# Patient Record
Sex: Female | Born: 1960 | Race: Black or African American | Hispanic: No | Smoking: Former smoker
Health system: Southern US, Community
[De-identification: ages and names within clinical notes are randomized; demographics above are authoritative.]

## PROBLEM LIST (undated history)

## (undated) DIAGNOSIS — K5900 Constipation, unspecified: Secondary | ICD-10-CM

## (undated) DIAGNOSIS — N201 Calculus of ureter: Secondary | ICD-10-CM

## (undated) DIAGNOSIS — N200 Calculus of kidney: Secondary | ICD-10-CM

## (undated) DIAGNOSIS — Z87442 Personal history of urinary calculi: Secondary | ICD-10-CM

## (undated) DIAGNOSIS — Z973 Presence of spectacles and contact lenses: Secondary | ICD-10-CM

## (undated) DIAGNOSIS — D509 Iron deficiency anemia, unspecified: Secondary | ICD-10-CM

## (undated) HISTORY — PX: APPENDECTOMY: SHX54

---

## 1998-08-08 ENCOUNTER — Emergency Department (HOSPITAL_COMMUNITY): Admission: EM | Admit: 1998-08-08 | Discharge: 1998-08-08 | Payer: Self-pay | Admitting: Internal Medicine

## 1998-08-08 ENCOUNTER — Encounter: Payer: Self-pay | Admitting: Internal Medicine

## 1998-08-23 ENCOUNTER — Emergency Department (HOSPITAL_COMMUNITY): Admission: EM | Admit: 1998-08-23 | Discharge: 1998-08-23 | Payer: Self-pay | Admitting: Emergency Medicine

## 2005-12-10 ENCOUNTER — Emergency Department (HOSPITAL_COMMUNITY): Admission: EM | Admit: 2005-12-10 | Discharge: 2005-12-10 | Payer: Self-pay | Admitting: *Deleted

## 2005-12-11 ENCOUNTER — Inpatient Hospital Stay (HOSPITAL_COMMUNITY): Admission: AD | Admit: 2005-12-11 | Discharge: 2005-12-13 | Payer: Self-pay | Admitting: Urology

## 2005-12-12 ENCOUNTER — Ambulatory Visit: Payer: Self-pay | Admitting: Dentistry

## 2005-12-25 ENCOUNTER — Ambulatory Visit (HOSPITAL_COMMUNITY): Admission: RE | Admit: 2005-12-25 | Discharge: 2005-12-25 | Payer: Self-pay | Admitting: Urology

## 2006-02-10 ENCOUNTER — Emergency Department (HOSPITAL_COMMUNITY): Admission: EM | Admit: 2006-02-10 | Discharge: 2006-02-10 | Payer: Self-pay | Admitting: Emergency Medicine

## 2006-04-28 HISTORY — PX: EXTRACORPOREAL SHOCK WAVE LITHOTRIPSY: SHX1557

## 2006-07-13 ENCOUNTER — Emergency Department (HOSPITAL_COMMUNITY): Admission: EM | Admit: 2006-07-13 | Discharge: 2006-07-13 | Payer: Self-pay | Admitting: Emergency Medicine

## 2015-01-01 ENCOUNTER — Encounter (HOSPITAL_COMMUNITY): Payer: Self-pay | Admitting: Emergency Medicine

## 2015-01-01 ENCOUNTER — Emergency Department (HOSPITAL_COMMUNITY)
Admission: EM | Admit: 2015-01-01 | Discharge: 2015-01-02 | Disposition: A | Discharge status: Home / Self Care | Payer: BLUE CROSS/BLUE SHIELD | Attending: Emergency Medicine | Admitting: Emergency Medicine

## 2015-01-01 ENCOUNTER — Emergency Department (HOSPITAL_COMMUNITY): Payer: BLUE CROSS/BLUE SHIELD

## 2015-01-01 DIAGNOSIS — N200 Calculus of kidney: Secondary | ICD-10-CM | POA: Diagnosis not present

## 2015-01-01 DIAGNOSIS — Z3202 Encounter for pregnancy test, result negative: Secondary | ICD-10-CM | POA: Insufficient documentation

## 2015-01-01 DIAGNOSIS — Z87891 Personal history of nicotine dependence: Secondary | ICD-10-CM | POA: Insufficient documentation

## 2015-01-01 DIAGNOSIS — N390 Urinary tract infection, site not specified: Secondary | ICD-10-CM | POA: Diagnosis not present

## 2015-01-01 DIAGNOSIS — R109 Unspecified abdominal pain: Secondary | ICD-10-CM

## 2015-01-01 HISTORY — DX: Calculus of kidney: N20.0

## 2015-01-01 LAB — CBC WITH DIFFERENTIAL/PLATELET
Basophils Absolute: 0 10*3/uL (ref 0.0–0.1)
Basophils Relative: 0 % (ref 0–1)
EOS ABS: 0 10*3/uL (ref 0.0–0.7)
EOS PCT: 0 % (ref 0–5)
HCT: 39.4 % (ref 36.0–46.0)
Hemoglobin: 12.2 g/dL (ref 12.0–15.0)
LYMPHS ABS: 1.6 10*3/uL (ref 0.7–4.0)
LYMPHS PCT: 16 % (ref 12–46)
MCH: 24.4 pg — AB (ref 26.0–34.0)
MCHC: 31 g/dL (ref 30.0–36.0)
MCV: 78.6 fL (ref 78.0–100.0)
MONO ABS: 0.9 10*3/uL (ref 0.1–1.0)
Monocytes Relative: 9 % (ref 3–12)
Neutro Abs: 7 10*3/uL (ref 1.7–7.7)
Neutrophils Relative %: 75 % (ref 43–77)
PLATELETS: 256 10*3/uL (ref 150–400)
RBC: 5.01 MIL/uL (ref 3.87–5.11)
RDW: 13.9 % (ref 11.5–15.5)
WBC: 9.4 10*3/uL (ref 4.0–10.5)

## 2015-01-01 LAB — I-STAT CHEM 8, ED
BUN: 26 mg/dL — ABNORMAL HIGH (ref 6–20)
CALCIUM ION: 1.17 mmol/L (ref 1.12–1.23)
Chloride: 107 mmol/L (ref 101–111)
Creatinine, Ser: 1.2 mg/dL — ABNORMAL HIGH (ref 0.44–1.00)
GLUCOSE: 117 mg/dL — AB (ref 65–99)
HCT: 44 % (ref 36.0–46.0)
HEMOGLOBIN: 15 g/dL (ref 12.0–15.0)
Potassium: 4.1 mmol/L (ref 3.5–5.1)
Sodium: 141 mmol/L (ref 135–145)
TCO2: 23 mmol/L (ref 0–100)

## 2015-01-01 MED ORDER — ONDANSETRON HCL 4 MG/2ML IJ SOLN
4.0000 mg | Freq: Once | INTRAMUSCULAR | Status: AC
  Administered 2015-01-01: 4 mg via INTRAVENOUS
  Filled 2015-01-01: qty 2

## 2015-01-01 MED ORDER — SODIUM CHLORIDE 0.9 % IV BOLUS (SEPSIS)
1000.0000 mL | Freq: Once | INTRAVENOUS | Status: AC
  Administered 2015-01-01: 1000 mL via INTRAVENOUS

## 2015-01-01 NOTE — ED Notes (Signed)
Pt reports left lower flank pain with emesis starting 3 hours ago. Hx nephrolithiasis. Reports dysuria. No other c/c. No active vomiting in triage.

## 2015-01-01 NOTE — ED Provider Notes (Signed)
CSN: [id number]   Arrival date & time 01/01/15 2041  History  This chart was scribed for Denise Crumble, MD by Bethel Born, ED Scribe. This patient was seen in room [facility] and the patient's care was started at 11:10 PM.  Chief Complaint  Patient presents with  . Flank Pain  . Emesis  . Hx Kidney Stones     HPI The history is provided by the patient. No language interpreter was used.   Denise Hudson is a 54 y.o. female with PMHx of nephrolithiasis who presents to the Emergency Department complaining of constant left flank pain with sudden onset today at work.  She has had similar pain in the past with kidney stones (some of which required surgical intervention). The pain is described as throbbing/aching and rated 10/10 in severity. Pt took nothing for pain PTA. Associated symptoms include abdominal pain, nausea, vomiting, and dysuria.   She states she has had decreased urination.  Past Medical History  Diagnosis Date  . Nephrolithiasis     History reviewed. No pertinent past surgical history.  History reviewed. No pertinent family history.  Social History  Substance Use Topics  . Smoking status: Former Games developer  . Smokeless tobacco: None  . Alcohol Use: No     Review of Systems  10 Systems reviewed and all are negative for acute change except as noted in the HPI.  Home Medications   Prior to Admission medications   Medication Sig Start Date End Date Taking? Authorizing Provider  Aspirin-Salicylamide-Caffeine (BC HEADACHE POWDER PO) Take 1 each by mouth once.   Yes Historical Provider, MD    Allergies  Review of patient's allergies indicates no known allergies.  Triage Vitals: BP 177/95 mmHg  Pulse 89  Temp(Src) 98.3 F (36.8 C) (Oral)  Resp 16  SpO2 97%  LMP 12/20/2014 (Approximate)  Physical Exam  Constitutional: She is oriented to person, place, and time. She appears well-developed and well-nourished. No distress.  HENT:  Head: Normocephalic and atraumatic.   Nose: Nose normal.  Mouth/Throat: Oropharynx is clear and moist. No oropharyngeal exudate.  Eyes: Conjunctivae and EOM are normal. Pupils are equal, round, and reactive to light. No scleral icterus.  Neck: Normal range of motion. Neck supple. No JVD present. No tracheal deviation present. No thyromegaly present.  Cardiovascular: Normal rate, regular rhythm and normal heart sounds.  Exam reveals no gallop and no friction rub.   No murmur heard. Pulmonary/Chest: Effort normal and breath sounds normal. No respiratory distress. She has no wheezes. She exhibits no tenderness.  Abdominal: Soft. Bowel sounds are normal. She exhibits no distension and no mass. There is no rebound and no guarding.  Left CVA tenderness  Musculoskeletal: Normal range of motion. She exhibits no edema or tenderness.  Lymphadenopathy:    She has no cervical adenopathy.  Neurological: She is alert and oriented to person, place, and time. No cranial nerve deficit. She exhibits normal muscle tone.  Skin: Skin is warm and dry. No rash noted. No erythema. No pallor.  Nursing note and vitals reviewed.   ED Course  Procedures   DIAGNOSTIC STUDIES: Oxygen Saturation is 97% on RA, normal by my interpretation.    COORDINATION OF CARE: 11:12 PM Discussed treatment plan which includes lab work, CT renal stone study, Zofran, and IVF with pt at bedside and pt agreed to plan. Pt declines pain medication at initial assessment.  Labs Reviewed  CBC WITH DIFFERENTIAL/PLATELET - Abnormal; Notable for the following:    MCH 24.4 (*)  All other components within normal limits  URINALYSIS, ROUTINE W REFLEX MICROSCOPIC (NOT AT [facility]) - Abnormal; Notable for the following:    APPearance TURBID (*)    Hgb urine dipstick LARGE (*)    Protein, ur 30 (*)    Leukocytes, UA MODERATE (*)    All other components within normal limits  URINE MICROSCOPIC-ADD ON - Abnormal; Notable for the following:    Squamous Epithelial / LPF FEW (*)     Bacteria, UA FEW (*)    All other components within normal limits  I-STAT CHEM 8, ED - Abnormal; Notable for the following:    BUN 26 (*)    Creatinine, Ser 1.20 (*)    Glucose, Bld 117 (*)    All other components within normal limits  POC URINE PREG, ED  POC URINE PREG, ED    I, Denise Crumble, MD, personally reviewed and evaluated these images and lab results as part of my medical decision-making.  Imaging Review Ct Renal Stone Study  01/02/2015   CLINICAL DATA:  54 year old female with left flank pain, nausea or vomiting.  EXAM: CT ABDOMEN AND PELVIS WITHOUT CONTRAST  TECHNIQUE: Multidetector CT imaging of the abdomen and pelvis was performed following the standard protocol without IV contrast.  COMPARISON:  CT dated 12/10/2005 abdominal radiograph dated 07/13/2006  FINDINGS: Evaluation of this exam is limited in the absence of intravenous contrast.  The visualized lung bases are clear. No intra-abdominal free air or free fluid.  The liver, gallbladder, pancreas, spleen, and adrenal glands appear unremarkable. There are two adjacent calculi in the distal left ureter adjacent to the left UVJ. The largest stone measures approximately 5 mm. There is moderate left perinephric ureter. Punctate nonobstructing left renal calculi noted. There is a 6 mm nonobstructing right renal inferior pole calculus. There is no hydronephrosis on the right. The visualized right ureter and urinary bladder appear unremarkable. The uterus is anteverted and appears grossly unremarkable.  There is no evidence of bowel obstruction or inflammation. Multiple surgical clips noted in the right lower quadrant. The appendix is not identified with certainty and may be surgically absent.  Mild aortoiliac atherosclerotic disease. There is no lymphadenopathy. Mild degenerative changes of the spine. No acute fracture. There is disc desiccation with vacuum phenomena at L5-S1.  IMPRESSION: Distal left ureteral/ left UVJ calculi with moderate  left hyperinflation. Correlation with urinalysis recommended to exclude superimposed UTI.   Electronically Signed   By: Elgie Collard M.D.   On: 01/02/2015 02:09       MDM   Final diagnoses:  None    Patient presents to the ED for L flank pain that is consistent with prior symptoms of nephrolithiasis.  She currently complains of nausea and was given zofran.  Will evaluate with CT of abdomen.  01:00 Patient now complaining of pain, will order toradol and morphine  0238 patient states her pain is currently gone. We'll discharge home with tramadol, Zofran, and urology follow-up. CT scan reveals 5 mm stone on the left side causing moderate hydronephrosis. Also will treat UTI with Keflex. She demonstrated understanding to the plan.  She appears well and in NAD.  Her VS remain within her normal limits and she is safe for DC.     I personally performed the services described in this documentation, which was scribed in my presence. The recorded information has been reviewed and is accurate.   Denise Crumble, MD 01/02/15 [phone]

## 2015-01-02 ENCOUNTER — Emergency Department (HOSPITAL_COMMUNITY): Payer: BLUE CROSS/BLUE SHIELD

## 2015-01-02 LAB — URINALYSIS, ROUTINE W REFLEX MICROSCOPIC
BILIRUBIN URINE: NEGATIVE
GLUCOSE, UA: NEGATIVE mg/dL
Ketones, ur: NEGATIVE mg/dL
Nitrite: NEGATIVE
Protein, ur: 30 mg/dL — AB
SPECIFIC GRAVITY, URINE: 1.023 (ref 1.005–1.030)
UROBILINOGEN UA: 1 mg/dL (ref 0.0–1.0)
pH: 6 (ref 5.0–8.0)

## 2015-01-02 LAB — POC URINE PREG, ED: PREG TEST UR: NEGATIVE

## 2015-01-02 LAB — URINE MICROSCOPIC-ADD ON

## 2015-01-02 MED ORDER — ONDANSETRON 4 MG PO TBDP: 4.0000 mg | ORAL_TABLET | Freq: Three times a day (TID) | ORAL | Status: DC | PRN

## 2015-01-02 MED ORDER — TRAMADOL HCL 50 MG PO TABS: 50.0000 mg | ORAL_TABLET | Freq: Two times a day (BID) | ORAL | Status: DC | PRN

## 2015-01-02 MED ORDER — CEPHALEXIN 500 MG PO CAPS
500.0000 mg | ORAL_CAPSULE | Freq: Once | ORAL | Status: AC
  Administered 2015-01-02: 500 mg via ORAL
  Filled 2015-01-02: qty 1

## 2015-01-02 MED ORDER — CEPHALEXIN 500 MG PO CAPS: 500.0000 mg | ORAL_CAPSULE | Freq: Two times a day (BID) | ORAL | Status: DC

## 2015-01-02 MED ORDER — MORPHINE SULFATE (PF) 4 MG/ML IV SOLN
4.0000 mg | Freq: Once | INTRAVENOUS | Status: AC
  Administered 2015-01-02: 4 mg via INTRAVENOUS
  Filled 2015-01-02: qty 1

## 2015-01-02 MED ORDER — KETOROLAC TROMETHAMINE 30 MG/ML IJ SOLN
30.0000 mg | Freq: Once | INTRAMUSCULAR | Status: AC
  Administered 2015-01-02: 30 mg via INTRAVENOUS
  Filled 2015-01-02: qty 1

## 2015-01-02 NOTE — Discharge Instructions (Signed)
Kidney Stones Ms. Eggleton, your CT scan shows a 5mm stone on the left side.  You also have a urinary infection.  Take antibiotics as directed and see urology within 3 days. Try ibuprofen for pain, that does not work take tramadol. For any worsening come back to emergency department immediately. Thank you. Kidney stones (urolithiasis) are solid masses that form inside your kidneys. The intense pain is caused by the stone moving through the kidney, ureter, bladder, and urethra (urinary tract). When the stone moves, the ureter starts to spasm around the stone. The stone is usually passed in your pee (urine).  HOME CARE  Drink enough fluids to keep your pee clear or pale yellow. This helps to get the stone out.  Strain all pee through the provided strainer. Do not pee without peeing through the strainer, not even once. If you pee the stone out, catch it in the strainer. The stone may be as small as a grain of salt. Take this to your doctor. This will help your doctor figure out what you can do to try to prevent more kidney stones.  Only take medicine as told by your doctor.  Follow up with your doctor as told.  Get follow-up X-rays as told by your doctor. GET HELP IF: You have pain that gets worse even if you have been taking pain medicine. GET HELP RIGHT AWAY IF:   Your pain does not get better with medicine.  You have a fever or shaking chills.  Your pain increases and gets worse over 18 hours.  You have new belly (abdominal) pain.  You feel faint or pass out.  You are unable to pee. MAKE SURE YOU:   Understand these instructions.  Will watch your condition.  Will get help right away if you are not doing well or get worse. Document Released: 10/01/2007 Document Revised: 12/15/2012 Document Reviewed: 09/15/2012 [facility] Patient Information 2015 [place], [place]. This information is not intended to replace advice given to you by your health care provider. Make sure you discuss any  questions you have with your health care provider.  Urinary Tract Infection A urinary tract infection (UTI) can occur any place along the urinary tract. The tract includes the kidneys, ureters, bladder, and urethra. A type of germ called bacteria often causes a UTI. UTIs are often helped with antibiotic medicine.  HOME CARE   If given, take antibiotics as told by your doctor. Finish them even if you start to feel better.  Drink enough fluids to keep your pee (urine) clear or pale yellow.  Avoid tea, drinks with caffeine, and bubbly (carbonated) drinks.  Pee often. Avoid holding your pee in for a long time.  Pee before and after having sex (intercourse).  Wipe from front to back after you poop (bowel movement) if you are a woman. Use each tissue only once. GET HELP RIGHT AWAY IF:   You have back pain.  You have lower belly (abdominal) pain.  You have chills.  You feel sick to your stomach (nauseous).  You throw up (vomit).  Your burning or discomfort with peeing does not go away.  You have a fever.  Your symptoms are not better in 3 days. MAKE SURE YOU:   Understand these instructions.  Will watch your condition.  Will get help right away if you are not doing well or get worse. Document Released: 10/01/2007 Document Revised: 01/07/2012 Document Reviewed: 11/13/2011 [facility] Patient Information 2015 [place], [place]. This information is not intended to replace advice  given to you by your health care provider. Make sure you discuss any questions you have with your health care provider. ° °

## 2015-01-02 NOTE — ED Notes (Signed)
Pt transported to CT ?

## 2015-09-23 ENCOUNTER — Emergency Department (HOSPITAL_COMMUNITY)
Admission: EM | Admit: 2015-09-23 | Discharge: 2015-09-23 | Disposition: A | Discharge status: Home / Self Care | Payer: BLUE CROSS/BLUE SHIELD | Attending: Emergency Medicine | Admitting: Emergency Medicine

## 2015-09-23 ENCOUNTER — Emergency Department (HOSPITAL_COMMUNITY): Payer: BLUE CROSS/BLUE SHIELD

## 2015-09-23 ENCOUNTER — Encounter (HOSPITAL_COMMUNITY): Payer: Self-pay | Admitting: Emergency Medicine

## 2015-09-23 DIAGNOSIS — Y929 Unspecified place or not applicable: Secondary | ICD-10-CM | POA: Diagnosis not present

## 2015-09-23 DIAGNOSIS — W278XXA Contact with other nonpowered hand tool, initial encounter: Secondary | ICD-10-CM | POA: Diagnosis not present

## 2015-09-23 DIAGNOSIS — Z7982 Long term (current) use of aspirin: Secondary | ICD-10-CM | POA: Diagnosis not present

## 2015-09-23 DIAGNOSIS — S61219A Laceration without foreign body of unspecified finger without damage to nail, initial encounter: Secondary | ICD-10-CM

## 2015-09-23 DIAGNOSIS — S6992XA Unspecified injury of left wrist, hand and finger(s), initial encounter: Secondary | ICD-10-CM | POA: Diagnosis present

## 2015-09-23 DIAGNOSIS — Z87891 Personal history of nicotine dependence: Secondary | ICD-10-CM | POA: Insufficient documentation

## 2015-09-23 DIAGNOSIS — M79642 Pain in left hand: Secondary | ICD-10-CM

## 2015-09-23 DIAGNOSIS — Y939 Activity, unspecified: Secondary | ICD-10-CM | POA: Diagnosis not present

## 2015-09-23 DIAGNOSIS — Z23 Encounter for immunization: Secondary | ICD-10-CM | POA: Insufficient documentation

## 2015-09-23 DIAGNOSIS — Y999 Unspecified external cause status: Secondary | ICD-10-CM | POA: Diagnosis not present

## 2015-09-23 DIAGNOSIS — S61217A Laceration without foreign body of left little finger without damage to nail, initial encounter: Secondary | ICD-10-CM | POA: Diagnosis not present

## 2015-09-23 MED ORDER — TETANUS-DIPHTH-ACELL PERTUSSIS 5-2.5-18.5 LF-MCG/0.5 IM SUSP
0.5000 mL | Freq: Once | INTRAMUSCULAR | Status: AC
  Administered 2015-09-23: 0.5 mL via INTRAMUSCULAR
  Filled 2015-09-23: qty 0.5

## 2015-09-23 NOTE — ED Notes (Signed)
Per pt, states she was doing some home repair-states she injured left little finger

## 2015-09-23 NOTE — ED Notes (Signed)
PT DISCHARGED. INSTRUCTIONS GIVEN. AAOX4. PT IN NO APPARENT DISTRESS. THE OPPORTUNITY TO ASK QUESTIONS WAS PROVIDED. 

## 2015-09-23 NOTE — ED Provider Notes (Signed)
CSN: [id number]     Arrival date & time 09/23/15  1905 History  By signing my name below, I, Phillis HaggisGabriella Gaje, attest that this documentation has been prepared under the direction and in the presence of AvayaSamantha Dowless, PA-C. Electronically Signed: Phillis HaggisGabriella Gaje, ED Scribe. 09/23/2015. 7:37 PM.   Chief Complaint  Patient presents with  . Finger Injury   The history is provided by the patient. No language interpreter was used.  HPI Comments: Denise Hudson is a 55 y.o. female who presents to the Emergency Department complaining of a left fifth finger injury onset 1 hour ago. Pt states that she was using a crowbar when it slipped and cut her finger. She states it felt like "a real bad pinch." She does not know when her last tdap was. She denies other injuries, numbness, or weakness.  She denies hx of DM.   Past Medical History  Diagnosis Date  . Nephrolithiasis    History reviewed. No pertinent past surgical history. No family history on file. Social History  Substance Use Topics  . Smoking status: Former Games developermoker  . Smokeless tobacco: None  . Alcohol Use: No   OB History    No data available     Review of Systems  Musculoskeletal: Positive for arthralgias.  Skin: Positive for wound.  Neurological: Negative for weakness and numbness.  All other systems reviewed and are negative.  Allergies  Review of patient's allergies indicates no known allergies.  Home Medications   Prior to Admission medications   Medication Sig Start Date End Date Taking? Authorizing Provider  Aspirin-Salicylamide-Caffeine (BC HEADACHE POWDER PO) Take 1 each by mouth once.    Historical Provider, MD  cephALEXin (KEFLEX) 500 MG capsule Take 1 capsule (500 mg total) by mouth 2 (two) times daily. 01/02/15   Tomasita CrumbleAdeleke Oni, MD  ondansetron (ZOFRAN ODT) 4 MG disintegrating tablet Take 1 tablet (4 mg total) by mouth every 8 (eight) hours as needed for nausea or vomiting. 01/02/15   Tomasita CrumbleAdeleke Oni, MD  traMADol (ULTRAM) 50  MG tablet Take 1 tablet (50 mg total) by mouth every 12 (twelve) hours as needed for severe pain. 01/02/15   Tomasita CrumbleAdeleke Oni, MD   BP 193/102 mmHg  Pulse 70  Temp(Src) 99 F (37.2 C) (Oral)  Resp 16  Ht 6' (1.829 m)  Wt 145 lb (65.772 kg)  BMI 19.66 kg/m2  SpO2 98%  LMP 09/23/2015 Physical Exam  Constitutional: She is oriented to person, place, and time. She appears well-developed and well-nourished. No distress.  HENT:  Head: Normocephalic and atraumatic.  Eyes: Conjunctivae are normal. Right eye exhibits no discharge. Left eye exhibits no discharge. No scleral icterus.  Cardiovascular: Normal rate.   Pulmonary/Chest: Effort normal.  Musculoskeletal:  Two 0.5 cm adjacent vertical lacerations to the extensor surface of left fifth digit between MCP and PIP. No foreign bodies seen or palpated. No evidence of tendon injury. Normal ROM of digit. Good cap refill.   Neurological: She is alert and oriented to person, place, and time. Coordination normal.  Skin: Skin is warm and dry. No rash noted. She is not diaphoretic. No erythema. No pallor.  Psychiatric: She has a normal mood and affect. Her behavior is normal.  Nursing note and vitals reviewed.   ED Course  Procedures (including critical care time) DIAGNOSTIC STUDIES: Oxygen Saturation is 98% on RA, normal by my interpretation.    COORDINATION OF CARE: 7:37 PM-Discussed treatment plan which includes x-ray and laceration repair with pt at bedside  and pt agreed to plan.   LACERATION REPAIR Performed by: Gaylyn Rong, PA-C Consent: Verbal consent obtained. Risks and benefits: risks, benefits and alternatives were discussed Patient identity confirmed: provided demographic data Time out performed prior to procedure Prepped and Draped in normal sterile fashion Wound explored Laceration Location: left fifth finger Laceration Length: 0.5 cm each  No Foreign Bodies seen or palpated Amount of cleaning: standard Skin closure:  dermabond Patient tolerance: Patient tolerated the procedure well with no immediate complications.   Labs Review Labs Reviewed - No data to display  Imaging Review Dg Hand Complete Left  09/23/2015  CLINICAL DATA:  Punched wall, with injury to left fifth finger. Soft tissue swelling. Initial encounter. EXAM: LEFT HAND - COMPLETE 3+ VIEW COMPARISON:  None. FINDINGS: There is no evidence of fracture or dislocation. The joint spaces are preserved. The carpal rows are intact, and demonstrate normal alignment. The soft tissues are unremarkable in appearance. IMPRESSION: No evidence of fracture or dislocation. Electronically Signed   By: Roanna Raider M.D.   On: 09/23/2015 20:07   I have personally reviewed and evaluated these images and lab results as part of my medical decision-making.   EKG Interpretation None      MDM   Tdap booster given.Pressure irrigation performed. Xray negative for foreign body or fx. Laceration occurred < 8 hours prior to repair which was well tolerated. Pt has no co morbidities to effect normal wound healing. Wound repaired with dermabond. Wound care discussed. Pt is hemodynamically stable w no complaints prior to dc.    Final diagnoses:  Finger laceration, initial encounter     I personally performed the services described in this documentation, which was scribed in my presence. The recorded information has been reviewed and is accurate.     Lester Kinsman [place], PA-C 09/23/15 2106  Linwood Dibbles, MD 09/24/15 [phone]

## 2015-09-23 NOTE — Discharge Instructions (Signed)
Laceration Care, Adult °A laceration is a cut that goes through all of the layers of the skin and into the tissue that is right under the skin. Some lacerations heal on their own. Others need to be closed with stitches (sutures), staples, skin adhesive strips, or skin glue. Proper laceration care minimizes the risk of infection and helps the laceration to heal better. °HOW TO CARE FOR YOUR LACERATION °If sutures or staples were used: °· Keep the wound clean and dry. °· If you were given a bandage (dressing), you should change it at least one time per day or as told by your health care provider. You should also change it if it becomes wet or dirty. °· Keep the wound completely dry for the first 24 hours or as told by your health care provider. After that time, you may shower or bathe. However, make sure that the wound is not soaked in water until after the sutures or staples have been removed. °· Clean the wound one time each day or as told by your health care provider: °¨ Wash the wound with soap and water. °¨ Rinse the wound with water to remove all soap. °¨ Pat the wound dry with a clean towel. Do not rub the wound. °· After cleaning the wound, apply a thin layer of antibiotic ointment as told by your health care provider. This will help to prevent infection and keep the dressing from sticking to the wound. °· Have the sutures or staples removed as told by your health care provider. °If skin adhesive strips were used: °· Keep the wound clean and dry. °· If you were given a bandage (dressing), you should change it at least one time per day or as told by your health care provider. You should also change it if it becomes dirty or wet. °· Do not get the skin adhesive strips wet. You may shower or bathe, but be careful to keep the wound dry. °· If the wound gets wet, pat it dry with a clean towel. Do not rub the wound. °· Skin adhesive strips fall off on their own. You may trim the strips as the wound heals. Do not  remove skin adhesive strips that are still stuck to the wound. They will fall off in time. °If skin glue was used: °· Try to keep the wound dry, but you may briefly wet it in the shower or bath. Do not soak the wound in water, such as by swimming. °· After you have showered or bathed, gently pat the wound dry with a clean towel. Do not rub the wound. °· Do not do any activities that will make you sweat heavily until the skin glue has fallen off on its own. °· Do not apply liquid, cream, or ointment medicine to the wound while the skin glue is in place. Using those may loosen the film before the wound has healed. °· If you were given a bandage (dressing), you should change it at least one time per day or as told by your health care provider. You should also change it if it becomes dirty or wet. °· If a dressing is placed over the wound, be careful not to apply tape directly over the skin glue. Doing that may cause the glue to be pulled off before the wound has healed. °· Do not pick at the glue. The skin glue usually remains in place for 5-10 days, then it falls off of the skin. °General Instructions °· Take over-the-counter and prescription   medicines only as told by your health care provider.  If you were prescribed an antibiotic medicine or ointment, take or apply it as told by your doctor. Do not stop using it even if your condition improves.  To help prevent scarring, make sure to cover your wound with sunscreen whenever you are outside after stitches are removed, after adhesive strips are removed, or when glue remains in place and the wound is healed. Make sure to wear a sunscreen of at least 30 SPF.  Do not scratch or pick at the wound.  Keep all follow-up visits as told by your health care provider. This is important.  Check your wound every day for signs of infection. Watch for:  Redness, swelling, or pain.  Fluid, blood, or pus.  Raise (elevate) the injured area above the level of your heart  while you are sitting or lying down, if possible. SEEK MEDICAL CARE IF:  You received a tetanus shot and you have swelling, severe pain, redness, or bleeding at the injection site.  You have a fever.  A wound that was closed breaks open.  You notice a bad smell coming from your wound or your dressing.  You notice something coming out of the wound, such as wood or glass.  Your pain is not controlled with medicine.  You have increased redness, swelling, or pain at the site of your wound.  You have fluid, blood, or pus coming from your wound.  You notice a change in the color of your skin near your wound.  You need to change the dressing frequently due to fluid, blood, or pus draining from the wound.  You develop a new rash.  You develop numbness around the wound. SEEK IMMEDIATE MEDICAL CARE IF:  You develop severe swelling around the wound.  Your pain suddenly increases and is severe.  You develop painful lumps near the wound or on skin that is anywhere on your body.  You have a red streak going away from your wound.  The wound is on your hand or foot and you cannot properly move a finger or toe.  The wound is on your hand or foot and you notice that your fingers or toes look pale or bluish.   This information is not intended to replace advice given to you by your health care provider. Make sure you discuss any questions you have with your health care provider.    Keep wound clean and dry. After 24 hours, may wash with antibacterial soap and water. Return to the ED if you experience redness or swelling around your finger, fevers, chills, inability to move your finger.

## 2017-02-10 ENCOUNTER — Encounter (HOSPITAL_COMMUNITY): Payer: Self-pay | Admitting: Emergency Medicine

## 2017-02-10 ENCOUNTER — Emergency Department (HOSPITAL_COMMUNITY): Payer: BLUE CROSS/BLUE SHIELD

## 2017-02-10 ENCOUNTER — Observation Stay (HOSPITAL_COMMUNITY): Payer: BLUE CROSS/BLUE SHIELD | Admitting: Certified Registered"

## 2017-02-10 ENCOUNTER — Observation Stay (HOSPITAL_COMMUNITY)
Admission: EM | Admit: 2017-02-10 | Discharge: 2017-02-10 | Disposition: A | Discharge status: Home / Self Care | Payer: BLUE CROSS/BLUE SHIELD | Attending: Urology | Admitting: Urology

## 2017-02-10 ENCOUNTER — Encounter (HOSPITAL_COMMUNITY)
Admission: EM | Disposition: A | Discharge status: Home / Self Care | Payer: Self-pay | Source: Home / Self Care | Attending: Emergency Medicine

## 2017-02-10 ENCOUNTER — Observation Stay (HOSPITAL_COMMUNITY): Payer: BLUE CROSS/BLUE SHIELD

## 2017-02-10 DIAGNOSIS — N200 Calculus of kidney: Secondary | ICD-10-CM

## 2017-02-10 DIAGNOSIS — Z87891 Personal history of nicotine dependence: Secondary | ICD-10-CM | POA: Diagnosis not present

## 2017-02-10 DIAGNOSIS — N390 Urinary tract infection, site not specified: Secondary | ICD-10-CM | POA: Insufficient documentation

## 2017-02-10 DIAGNOSIS — N132 Hydronephrosis with renal and ureteral calculous obstruction: Secondary | ICD-10-CM | POA: Diagnosis not present

## 2017-02-10 HISTORY — PX: CYSTOSCOPY WITH STENT PLACEMENT: SHX5790

## 2017-02-10 LAB — CBC
HCT: 41.2 % (ref 36.0–46.0)
Hemoglobin: 13 g/dL (ref 12.0–15.0)
MCH: 24.9 pg — ABNORMAL LOW (ref 26.0–34.0)
MCHC: 31.6 g/dL (ref 30.0–36.0)
MCV: 78.9 fL (ref 78.0–100.0)
Platelets: 269 10*3/uL (ref 150–400)
RBC: 5.22 MIL/uL — ABNORMAL HIGH (ref 3.87–5.11)
RDW: 13.8 % (ref 11.5–15.5)
WBC: 11.6 10*3/uL — ABNORMAL HIGH (ref 4.0–10.5)

## 2017-02-10 LAB — URINALYSIS, ROUTINE W REFLEX MICROSCOPIC
BILIRUBIN URINE: NEGATIVE
GLUCOSE, UA: NEGATIVE mg/dL
KETONES UR: NEGATIVE mg/dL
Nitrite: POSITIVE — AB
PROTEIN: 100 mg/dL — AB
Specific Gravity, Urine: 1.014 (ref 1.005–1.030)
pH: 6 (ref 5.0–8.0)

## 2017-02-10 LAB — BASIC METABOLIC PANEL
Anion gap: 11 (ref 5–15)
BUN: 24 mg/dL — AB (ref 6–20)
CALCIUM: 9.5 mg/dL (ref 8.9–10.3)
CO2: 22 mmol/L (ref 22–32)
CREATININE: 1.33 mg/dL — AB (ref 0.44–1.00)
Chloride: 106 mmol/L (ref 101–111)
GFR calc non Af Amer: 44 mL/min — ABNORMAL LOW (ref >60)
GFR, EST AFRICAN AMERICAN: 51 mL/min — AB (ref >60)
Glucose, Bld: 143 mg/dL — ABNORMAL HIGH (ref 65–99)
Potassium: 3.7 mmol/L (ref 3.5–5.1)
SODIUM: 139 mmol/L (ref 135–145)

## 2017-02-10 SURGERY — CYSTOSCOPY, WITH STENT INSERTION
Anesthesia: General | Site: Ureter | Laterality: Right

## 2017-02-10 MED ORDER — LACTATED RINGERS IV SOLN
INTRAVENOUS | Status: DC | PRN
  Administered 2017-02-10: 17:00:00 via INTRAVENOUS

## 2017-02-10 MED ORDER — ONDANSETRON HCL 4 MG/2ML IJ SOLN
INTRAMUSCULAR | Status: AC
  Filled 2017-02-10: qty 2

## 2017-02-10 MED ORDER — DEXTROSE 5 % IV SOLN
1.0000 g | Freq: Once | INTRAVENOUS | Status: AC
  Administered 2017-02-10: 1 g via INTRAVENOUS
  Filled 2017-02-10 (×2): qty 10

## 2017-02-10 MED ORDER — ONDANSETRON HCL 4 MG/2ML IJ SOLN: 4.0000 mg | Freq: Once | INTRAMUSCULAR | Status: DC | PRN

## 2017-02-10 MED ORDER — MIDAZOLAM HCL 2 MG/2ML IJ SOLN
INTRAMUSCULAR | Status: AC
  Filled 2017-02-10: qty 2

## 2017-02-10 MED ORDER — ACETAMINOPHEN 10 MG/ML IV SOLN
INTRAVENOUS | Status: AC
  Filled 2017-02-10: qty 100

## 2017-02-10 MED ORDER — LACTATED RINGERS IV SOLN
INTRAVENOUS | Status: DC
  Administered 2017-02-10: 16:00:00 via INTRAVENOUS

## 2017-02-10 MED ORDER — HYDROCODONE-ACETAMINOPHEN 5-325 MG PO TABS: 1.0000 | ORAL_TABLET | ORAL | 0 refills | Status: DC | PRN

## 2017-02-10 MED ORDER — ACETAMINOPHEN 10 MG/ML IV SOLN
INTRAVENOUS | Status: DC | PRN
  Administered 2017-02-10: 1000 mg via INTRAVENOUS

## 2017-02-10 MED ORDER — SENNA 8.6 MG PO TABS: 1.0000 | ORAL_TABLET | Freq: Two times a day (BID) | ORAL | Status: DC

## 2017-02-10 MED ORDER — MORPHINE SULFATE (PF) 4 MG/ML IV SOLN
2.0000 mg | INTRAVENOUS | Status: DC | PRN
Start: 2017-02-10 — End: 2017-02-10

## 2017-02-10 MED ORDER — FENTANYL CITRATE (PF) 100 MCG/2ML IJ SOLN: 25.0000 ug | INTRAMUSCULAR | Status: DC | PRN

## 2017-02-10 MED ORDER — ACETAMINOPHEN 325 MG PO TABS: 650.0000 mg | ORAL_TABLET | ORAL | Status: DC | PRN

## 2017-02-10 MED ORDER — OXYCODONE HCL 5 MG PO TABS: 5.0000 mg | ORAL_TABLET | ORAL | Status: DC | PRN

## 2017-02-10 MED ORDER — ONDANSETRON HCL 4 MG/2ML IJ SOLN
4.0000 mg | Freq: Once | INTRAMUSCULAR | Status: AC
  Administered 2017-02-10: 4 mg via INTRAVENOUS
  Filled 2017-02-10: qty 2

## 2017-02-10 MED ORDER — PROPOFOL 10 MG/ML IV BOLUS
INTRAVENOUS | Status: AC
  Filled 2017-02-10: qty 40

## 2017-02-10 MED ORDER — ONDANSETRON HCL 4 MG/2ML IJ SOLN
INTRAMUSCULAR | Status: DC | PRN
  Administered 2017-02-10: 4 mg via INTRAVENOUS

## 2017-02-10 MED ORDER — MEPERIDINE HCL 50 MG/ML IJ SOLN: 6.2500 mg | INTRAMUSCULAR | Status: DC | PRN

## 2017-02-10 MED ORDER — LIDOCAINE HCL (CARDIAC) 10 MG/ML IV SOLN
INTRAVENOUS | Status: DC | PRN
  Administered 2017-02-10: 100 mg via INTRAVENOUS

## 2017-02-10 MED ORDER — DEXAMETHASONE SODIUM PHOSPHATE 10 MG/ML IJ SOLN
INTRAMUSCULAR | Status: DC | PRN
  Administered 2017-02-10: 10 mg via INTRAVENOUS

## 2017-02-10 MED ORDER — LIDOCAINE HCL 2 % EX GEL
CUTANEOUS | Status: AC
  Filled 2017-02-10: qty 5

## 2017-02-10 MED ORDER — IOHEXOL 300 MG/ML  SOLN
INTRAMUSCULAR | Status: DC | PRN
  Administered 2017-02-10: 9 mL via URETHRAL

## 2017-02-10 MED ORDER — PROPOFOL 10 MG/ML IV BOLUS
INTRAVENOUS | Status: DC | PRN
  Administered 2017-02-10: 180 mg via INTRAVENOUS

## 2017-02-10 MED ORDER — DEXTROSE-NACL 5-0.9 % IV SOLN: INTRAVENOUS | Status: DC

## 2017-02-10 MED ORDER — MIDAZOLAM HCL 5 MG/5ML IJ SOLN
INTRAMUSCULAR | Status: DC | PRN
  Administered 2017-02-10: 2 mg via INTRAVENOUS

## 2017-02-10 MED ORDER — FENTANYL CITRATE (PF) 250 MCG/5ML IJ SOLN
INTRAMUSCULAR | Status: AC
  Filled 2017-02-10: qty 5

## 2017-02-10 MED ORDER — SODIUM CHLORIDE 0.9 % IR SOLN
Status: DC | PRN
  Administered 2017-02-10: 3000 mL

## 2017-02-10 MED ORDER — ONDANSETRON HCL 4 MG/2ML IJ SOLN: 4.0000 mg | INTRAMUSCULAR | Status: DC | PRN

## 2017-02-10 MED ORDER — KETOROLAC TROMETHAMINE 30 MG/ML IJ SOLN
30.0000 mg | Freq: Once | INTRAMUSCULAR | Status: AC
Start: 2017-02-10 — End: 2017-02-10
  Administered 2017-02-10: 30 mg via INTRAVENOUS
  Filled 2017-02-10: qty 1

## 2017-02-10 MED ORDER — CIPROFLOXACIN HCL 500 MG PO TABS: 500.0000 mg | ORAL_TABLET | Freq: Two times a day (BID) | ORAL | 0 refills | Status: AC

## 2017-02-10 MED ORDER — ONDANSETRON HCL 4 MG PO TABS: 4.0000 mg | ORAL_TABLET | Freq: Every day | ORAL | 1 refills | Status: AC | PRN

## 2017-02-10 MED ORDER — DOCUSATE SODIUM 100 MG PO CAPS: 100.0000 mg | ORAL_CAPSULE | Freq: Two times a day (BID) | ORAL | Status: DC

## 2017-02-10 MED ORDER — FENTANYL CITRATE (PF) 100 MCG/2ML IJ SOLN
INTRAMUSCULAR | Status: DC | PRN
  Administered 2017-02-10: 50 ug via INTRAVENOUS

## 2017-02-10 MED ORDER — DEXAMETHASONE SODIUM PHOSPHATE 10 MG/ML IJ SOLN
INTRAMUSCULAR | Status: AC
  Filled 2017-02-10: qty 1

## 2017-02-10 MED ORDER — MORPHINE SULFATE (PF) 4 MG/ML IV SOLN
4.0000 mg | Freq: Once | INTRAVENOUS | Status: AC
  Administered 2017-02-10: 4 mg via INTRAVENOUS
  Filled 2017-02-10: qty 1

## 2017-02-10 SURGICAL SUPPLY — 13 items
BAG URO CATCHER STRL LF (MISCELLANEOUS) ×3 IMPLANT
CATH INTERMIT  6FR 70CM (CATHETERS) ×3 IMPLANT
CLOTH BEACON ORANGE TIMEOUT ST (SAFETY) ×3 IMPLANT
COVER FOOTSWITCH UNIV (MISCELLANEOUS) IMPLANT
COVER SURGICAL LIGHT HANDLE (MISCELLANEOUS) ×3 IMPLANT
GLOVE BIOGEL M STRL SZ7.5 (GLOVE) ×3 IMPLANT
GOWN STRL REUS W/TWL LRG LVL3 (GOWN DISPOSABLE) ×6 IMPLANT
GUIDEWIRE STR DUAL SENSOR (WIRE) ×3 IMPLANT
MANIFOLD NEPTUNE II (INSTRUMENTS) ×3 IMPLANT
PACK CYSTO (CUSTOM PROCEDURE TRAY) ×3 IMPLANT
STENT URET 6FRX26 CONTOUR (STENTS) ×3 IMPLANT
TUBING CONNECTING 10 (TUBING) ×2 IMPLANT
TUBING CONNECTING 10' (TUBING) ×1

## 2017-02-10 NOTE — Anesthesia Procedure Notes (Signed)
Procedure Name: LMA Insertion Date/Time: 02/10/2017 5:11 PM Performed by: Anastasio Champion E Pre-anesthesia Checklist: Patient identified, Emergency Drugs available, Suction available and Patient being monitored Patient Re-evaluated:Patient Re-evaluated prior to induction Oxygen Delivery Method: Circle system utilized Preoxygenation: Pre-oxygenation with 100% oxygen Induction Type: IV induction Ventilation: Mask ventilation without difficulty LMA: LMA with gastric port inserted LMA Size: 4.0 Tube type: Oral Number of attempts: 1 Airway Equipment and Method: Oral airway Placement Confirmation: positive ETCO2 Tube secured with: Tape Dental Injury: Teeth and Oropharynx as per pre-operative assessment  Comments: Teeth rotten and lower teeth very loose.Gastric sump to stomach and  Decompressed stomach 300 cc's clear secretions

## 2017-02-10 NOTE — H&P (Signed)
Urology H+P Note   Admitting Attending: Dr. Liliane Shi  Chief Complaint:  Right flank pain  HPI: Denise Hudson is a 56 y.o. female with a history of nephrolithiasis who presents with 1 day of right flank pain, associated with nausea and vomiting. No fevers or chills. Does endorse dysuria, frequency. No gross hematuria. No left-sided pain.   She has undergone ESWL once in the past about 10 years ago. Otherwise she was passed about 10 other stones without intervention, starting around age 43.   Otherwise healthy, hx appy. Takes no meds.    Past Medical History: Past Medical History:  Diagnosis Date  . Nephrolithiasis     Past Surgical History:  History reviewed. No pertinent surgical history.  Medication: Current Facility-Administered Medications  Medication Dose Route Frequency Provider Last Rate Last Dose  . cefTRIAXone (ROCEPHIN) 1 g in dextrose 5 % 50 mL IVPB  1 g Intravenous Once Rolan Bucco, MD       Current Outpatient Prescriptions  Medication Sig Dispense Refill  . Aspirin-Salicylamide-Caffeine (BC HEADACHE POWDER PO) Take 1 packet by mouth daily as needed (pain).     . cephALEXin (KEFLEX) 500 MG capsule Take 1 capsule (500 mg total) by mouth 2 (two) times daily. (Patient not taking: Reported on 02/10/2017) 14 capsule 0  . ondansetron (ZOFRAN ODT) 4 MG disintegrating tablet Take 1 tablet (4 mg total) by mouth every 8 (eight) hours as needed for nausea or vomiting. (Patient not taking: Reported on 02/10/2017) 12 tablet 0  . traMADol (ULTRAM) 50 MG tablet Take 1 tablet (50 mg total) by mouth every 12 (twelve) hours as needed for severe pain. (Patient not taking: Reported on 02/10/2017) 10 tablet 0    Allergies: No Known Allergies  Social History: Social History  Substance Use Topics  . Smoking status: Former Games developer  . Smokeless tobacco: Not on file  . Alcohol use No    Family History No family history on file.  Review of Systems 10 systems were reviewed and are  negative except as noted specifically in the HPI.  Objective   Vital signs in last 24 hours: BP (!) 169/68 (BP Location: Left Arm)   Pulse 60   Temp 98.1 F (36.7 C)   Resp 17   LMP 09/23/2015   SpO2 98%   Physical Exam General: NAD, A&O, resting, appropriate HEENT: [place]/AT, EOMI, MMM Pulmonary: Normal work of breathing Cardiovascular: HDS, adequate peripheral perfusion Abdomen: Soft, NTTP, nondistended. GU:  + right CVA tenderness Extremities: warm and well perfused Neuro: Appropriate, no focal neurological deficits  Most Recent Labs: Lab Results  Component Value Date   WBC 11.6 (H) 02/10/2017   HGB 13.0 02/10/2017   HCT 41.2 02/10/2017   PLT 269 02/10/2017    Lab Results  Component Value Date   NA 139 02/10/2017   K 3.7 02/10/2017   CL 106 02/10/2017   CO2 22 02/10/2017   BUN 24 (H) 02/10/2017   CREATININE 1.33 (H) 02/10/2017   CALCIUM 9.5 02/10/2017    No results found for: INR, APTT   IMAGING: Ct Renal Stone Study  Result Date: 02/10/2017 CLINICAL DATA:  Right-sided flank pain EXAM: CT ABDOMEN AND PELVIS WITHOUT CONTRAST TECHNIQUE: Multidetector CT imaging of the abdomen and pelvis was performed following the standard protocol without IV contrast. COMPARISON:  01/02/2015 FINDINGS: Lower chest: No acute abnormality. Hepatobiliary: No focal liver abnormality is seen. No gallstones, gallbladder wall thickening, or biliary dilatation. Pancreas: Unremarkable. No pancreatic ductal dilatation or surrounding inflammatory changes.  Spleen: Normal in size without focal abnormality. Adrenals/Urinary Tract: The adrenal glands are within normal limits. Multiple nonobstructing left renal stones are noted. The largest of these lies in the lower pole measuring approximately 5 mm in greatest dimension. Right kidney demonstrates severe hydronephrosis and hydroureter extending inferiorly to the level of the distal ureter just above the UVJ. Considerable perinephric stranding is noted  on the right. There is a 10 mm stone identified in the distal right ureter causing the obstructive change. The bladder is partially decompressed. Stomach/Bowel: Stomach is within normal limits. Appendix appears to have been surgically removed. No evidence of bowel wall thickening, distention, or inflammatory changes. Vascular/Lymphatic: Aortic atherosclerosis. No enlarged abdominal or pelvic lymph nodes. Reproductive: Uterus and bilateral adnexa are unremarkable. Other: No abdominal wall hernia or abnormality. No abdominopelvic ascites. Musculoskeletal: No acute or significant osseous findings. IMPRESSION: Distal right ureteral stone measuring 10 mm with considerable hydronephrosis and hydroureter. Nonobstructing left renal stones. No other focal abnormality is noted. Electronically Signed   By: Alcide Clever M.D.   On: 02/10/2017 11:02    ------  Assessment:  56 y.o. female with an obstructing 10mm distal right ureteral stone in the setting of UTI. Also noted with several small non-obstructing left renal stone. Vitals stable currently, with mildly elevated Cr (1.33) and WBC (11.6k). Rec'd 1g CTX, Uxc pending.    Plan: - NPO for cysto, right stent placement this afternoon.  - Admit to urology post-op for observation.   I agree with Dr. McCormick's assessment and plan.  Will proceed to the OR for cystoscopy with Right RPG and Right JJ stent placement.  The risks, benefits and alternatives were discussed with the patient.  She voices understanding and wishes to proceed.

## 2017-02-10 NOTE — Op Note (Signed)
Operative Note  Preoperative diagnosis:  1.  Obstructing 1 cm right UVJ stone 2.  Urinary tract infection  Postoperative diagnosis: 1.  Obstructing 1 cm right UVJ stone 2.  Urinary tract infection  Procedure(s): 1.  Cystoscopy  2.  Right retrograde pyelogram 3.  Right JJ stent placement  Surgeon: Rhoderick Moody, MD  Assistants:  None  Anesthesia:  General LMA  Complications:  None  EBL:  <5 mL  Specimens: 1. None  Drains/Catheters: 1.  Right 6 French JJ stent without tether  Intraoperative findings:  Purulent debris was expelled from the right ureteral orifice after stent placement.  Right JJ stent in good position on fluoroscopy  Indication:  Denise Hudson is a 56 y.o. female with a history of nephrolithiasis who presents with 1 day of right flank pain, associated with nausea and vomiting. No fevers or chills. Does endorse dysuria, frequency. No gross hematuria. No left-sided pain.  CT stone study today demonstrates a 1 cm right UVJ stone with severe hydronephrosis.  She has been consented for the above procedures, voices understanding and wishes to proceed.   Description of procedure: After informed consent was obtained, the patient was brought to the operating room and general LMA anesthesia was administered. The patient was then placed in the dorsolithotomy position and prepped and draped in usual sterile fashion. A timeout was performed. A 21 French rigid cystoscope was then inserted into the urethral meatus and advanced into the bladder under direct vision. A complete bladder survey revealed no intravesical pathology.  A 6 French open-ended catheter was then inserted into the right ureteral orifice and a retrograde pyelogram was obtained. Contrast filled markedly dilated right distal ureter and demonstrated a filling defect consistent with the stone seen on her CT scan from today. Due to the dilation of her right collecting system, the contrast did not completely  outline her entire right collecting system and out of concern for a possible urinary tract infection, added pressure to the contrast injection was not performed. A floppy tip Glidewire was then inserted through the lumen of the ureteral catheter and was advanced up to the right renal pelvis, under fluoroscopic guidance. The open-ended catheter was then removed, leaving the wire in place. A 6 [place] JJ stent was then placed over the wire and into good position within the right collecting system. There was a moderate amount of purulent debris expressed from the stent/right ureteral orifice following stent placement. The patient's bladder was then drained. She tolerated the procedure well and was transferred to the postanesthesia in stable condition.  Plan: Follow-up in 1 week to schedule definitive stone treatment. The patient will be sent home with a one-week course of Cipro 500 mg twice a day, pain medication and nausea medication.

## 2017-02-10 NOTE — Progress Notes (Signed)
Assessment unchanged.  Scripts were given per MD order.  All questions pertaining to D/C we answered.  Pt was D/C'd via wheelchair and accompanied by RN. Pt able to tolerate fluids prior to discharge. Pt able to void prior to discharge.  Sherron Monday

## 2017-02-10 NOTE — ED Notes (Signed)
Call report to [facility] at [phone] at 1500.

## 2017-02-10 NOTE — Interval H&P Note (Signed)
History and Physical Interval Note:  02/10/2017 5:01 PM  Laren Everts  has presented today for surgery, with the diagnosis of right ureteral obstruction  The various methods of treatment have been discussed with the patient and family. After consideration of risks, benefits and other options for treatment, the patient has consented to  Procedure(s): CYSTOSCOPY WITH RIGHT STENT PLACEMENT (Right) as a surgical intervention .  The patient's history has been reviewed, patient examined, no change in status, stable for surgery.  I have reviewed the patient's chart and labs.  Questions were answered to the patient's satisfaction.     Dorian Furnace Zyier Dykema

## 2017-02-10 NOTE — Anesthesia Preprocedure Evaluation (Addendum)
Anesthesia Evaluation  Patient identified by MRN, date of birth, ID band Patient awake    Reviewed: Allergy & Precautions, NPO status , Patient's Chart, lab work & pertinent test results  Airway Mallampati: I  TM Distance: >3 FB Neck ROM: Full    Dental no notable dental hx. (+) Poor Dentition, Chipped, Loose, Missing, Dental Advisory Given   Pulmonary neg pulmonary ROS, former smoker,    Pulmonary exam normal breath sounds clear to auscultation       Cardiovascular negative cardio ROS Normal cardiovascular exam Rhythm:Regular Rate:Normal     Neuro/Psych negative neurological ROS  negative psych ROS   GI/Hepatic negative GI ROS, Neg liver ROS,   Endo/Other  negative endocrine ROS  Renal/GU negative Renal ROS  negative genitourinary   Musculoskeletal negative musculoskeletal ROS (+)   Abdominal   Peds negative pediatric ROS (+)  Hematology negative hematology ROS (+)   Anesthesia Other Findings Extremely poor dentition, nothing overtly loose but obvious extensive carries  Reproductive/Obstetrics negative OB ROS                            Anesthesia Physical Anesthesia Plan  ASA: II  Anesthesia Plan: General   Post-op Pain Management:    Induction: Intravenous  PONV Risk Score and Plan: 3 and Ondansetron, Dexamethasone, Midazolam, Treatment may vary due to age or medical condition and Scopolamine patch - Pre-op  Airway Management Planned: LMA  Additional Equipment:   Intra-op Plan:   Post-operative Plan:   Informed Consent:   Plan Discussed with: CRNA and Surgeon  Anesthesia Plan Comments: ( )        Anesthesia Quick Evaluation

## 2017-02-10 NOTE — ED Notes (Signed)
Bed: WA17 Expected date:  Expected time:  Means of arrival:  Comments: Hold for triage 4 

## 2017-02-10 NOTE — Anesthesia Postprocedure Evaluation (Signed)
Anesthesia Post Note  Patient: LEMOYNE SCARPATI  Procedure(s) Performed: CYSTOSCOPY WITH RIGHT STENT PLACEMENT (Right Ureter)     Patient location during evaluation: PACU Anesthesia Type: General Level of consciousness: awake and alert Pain management: pain level controlled Vital Signs Assessment: post-procedure vital signs reviewed and stable Respiratory status: spontaneous breathing, nonlabored ventilation, respiratory function stable and patient connected to nasal cannula oxygen Cardiovascular status: blood pressure returned to baseline and stable Postop Assessment: no apparent nausea or vomiting Anesthetic complications: no    Last Vitals:  Vitals:   02/10/17 1745 02/10/17 1800  BP: (!) 113/54 (!) 119/57  Pulse: 65 (!) 55  Resp: 12 12  Temp:    SpO2: 100% 100%    Last Pain:  Vitals:   02/10/17 1800  PainSc: Asleep                 Edlyn Rosenburg

## 2017-02-10 NOTE — ED Triage Notes (Signed)
Per pt, states right flank pain that started yesterday-increased pain with urinary frequency and hesitancy this am

## 2017-02-10 NOTE — ED Notes (Signed)
Will redo BP after triage

## 2017-02-10 NOTE — ED Provider Notes (Signed)
[facility]-EMERGENCY DEPT Provider Note   CSN: [id number] Arrival date & time: 02/10/17  7829     History   Chief Complaint Chief Complaint  Patient presents with  . Flank Pain    HPI Denise Hudson is a 56 y.o. female.  Patient is a 56 year old female who presents with right flank pain. She has a history of kidney stones with her most recent one being about 2 years ago. She states she started last night having some pain in her right back that now radiates to her right lower abdomen. Been fairly constant since last night. She's had some associated nausea and vomiting. No fevers. She has a little bit of urinary hesitancy but no pain on urination.She has required lithotripsy in the past. She has been followed by [facility].      Past Medical History:  Diagnosis Date  . Nephrolithiasis     Patient Active Problem List   Diagnosis Date Noted  . Nephrolith 02/10/2017    History reviewed. No pertinent surgical history.  OB History    No data available       Home Medications    Prior to Admission medications   Medication Sig Start Date End Date Taking? Authorizing Provider  Aspirin-Salicylamide-Caffeine (BC HEADACHE POWDER PO) Take 1 packet by mouth daily as needed (pain).    Yes [provider]  cephALEXin (KEFLEX) 500 MG capsule Take 1 capsule (500 mg total) by mouth 2 (two) times daily. Patient not taking: Reported on 02/10/2017 01/02/15   [name], MD  ondansetron (ZOFRAN ODT) 4 MG disintegrating tablet Take 1 tablet (4 mg total) by mouth every 8 (eight) hours as needed for nausea or vomiting. Patient not taking: Reported on 02/10/2017 01/02/15   [name], MD  traMADol (ULTRAM) 50 MG tablet Take 1 tablet (50 mg total) by mouth every 12 (twelve) hours as needed for severe pain. Patient not taking: Reported on 02/10/2017 01/02/15   [name], MD    Family History History reviewed. No pertinent family history.  Social  History Social History  Substance Use Topics  . Smoking status: Former Games developer  . Smokeless tobacco: Never Used  . Alcohol use No     Allergies   Patient has no known allergies.   Review of Systems Review of Systems  Constitutional: Negative for chills, diaphoresis, fatigue and fever.  HENT: Negative for congestion, rhinorrhea and sneezing.   Eyes: Negative.   Respiratory: Negative for cough, chest tightness and shortness of breath.   Cardiovascular: Negative for chest pain and leg swelling.  Gastrointestinal: Positive for abdominal pain, nausea and vomiting. Negative for blood in stool and diarrhea.  Genitourinary: Positive for flank pain. Negative for difficulty urinating, frequency and hematuria.  Musculoskeletal: Positive for back pain. Negative for arthralgias.  Skin: Negative for rash.  Neurological: Negative for dizziness, speech difficulty, weakness, numbness and headaches.     Physical Exam Updated Vital Signs BP (!) 169/68 (BP Location: Left Arm)   Pulse 60   Temp 98.1 F (36.7 C)   Resp 17   LMP 09/23/2015   SpO2 98%   Physical Exam  Constitutional: She is oriented to person, place, and time. She appears well-developed and well-nourished.  HENT:  Head: Normocephalic and atraumatic.  Eyes: Pupils are equal, round, and reactive to light.  Neck: Normal range of motion. Neck supple.  Cardiovascular: Normal rate, regular rhythm and normal heart sounds.   Pulmonary/Chest: Effort normal and breath sounds normal. No  respiratory distress. She has no wheezes. She has no rales. She exhibits no tenderness.  Abdominal: Soft. Bowel sounds are normal. There is tenderness (positive tenderness in the right mid and lower abdomen and right flank). There is no rebound and no guarding.  Musculoskeletal: Normal range of motion. She exhibits no edema.  Lymphadenopathy:    She has no cervical adenopathy.  Neurological: She is alert and oriented to person, place, and time.  Skin:  Skin is warm and dry. No rash noted.  Psychiatric: She has a normal mood and affect.     ED Treatments / Results  Labs (all labs ordered are listed, but only abnormal results are displayed) Labs Reviewed  URINALYSIS, ROUTINE W REFLEX MICROSCOPIC - Abnormal; Notable for the following:       Result Value   Color, Urine AMBER (*)    Hgb urine dipstick LARGE (*)    Protein, ur 100 (*)    Nitrite POSITIVE (*)    Leukocytes, UA SMALL (*)    Bacteria, UA RARE (*)    Squamous Epithelial / LPF 6-30 (*)    All other components within normal limits  CBC - Abnormal; Notable for the following:    WBC 11.6 (*)    RBC 5.22 (*)    MCH 24.9 (*)    All other components within normal limits  BASIC METABOLIC PANEL - Abnormal; Notable for the following:    Glucose, Bld 143 (*)    BUN 24 (*)    Creatinine, Ser 1.33 (*)    GFR calc non Af Amer 44 (*)    GFR calc Af Amer 51 (*)    All other components within normal limits  URINE CULTURE  HIV ANTIBODY (ROUTINE TESTING)    EKG  EKG Interpretation None       Radiology Ct Renal Stone Study  Result Date: 02/10/2017 CLINICAL DATA:  Right-sided flank pain EXAM: CT ABDOMEN AND PELVIS WITHOUT CONTRAST TECHNIQUE: Multidetector CT imaging of the abdomen and pelvis was performed following the standard protocol without IV contrast. COMPARISON:  01/02/2015 FINDINGS: Lower chest: No acute abnormality. Hepatobiliary: No focal liver abnormality is seen. No gallstones, gallbladder wall thickening, or biliary dilatation. Pancreas: Unremarkable. No pancreatic ductal dilatation or surrounding inflammatory changes. Spleen: Normal in size without focal abnormality. Adrenals/Urinary Tract: The adrenal glands are within normal limits. Multiple nonobstructing left renal stones are noted. The largest of these lies in the lower pole measuring approximately 5 mm in greatest dimension. Right kidney demonstrates severe hydronephrosis and hydroureter extending inferiorly to  the level of the distal ureter just above the UVJ. Considerable perinephric stranding is noted on the right. There is a 10 mm stone identified in the distal right ureter causing the obstructive change. The bladder is partially decompressed. Stomach/Bowel: Stomach is within normal limits. Appendix appears to have been surgically removed. No evidence of bowel wall thickening, distention, or inflammatory changes. Vascular/Lymphatic: Aortic atherosclerosis. No enlarged abdominal or pelvic lymph nodes. Reproductive: Uterus and bilateral adnexa are unremarkable. Other: No abdominal wall hernia or abnormality. No abdominopelvic ascites. Musculoskeletal: No acute or significant osseous findings. IMPRESSION: Distal right ureteral stone measuring 10 mm with considerable hydronephrosis and hydroureter. Nonobstructing left renal stones. No other focal abnormality is noted. Electronically Signed   By: Alcide Clever M.D.   On: 02/10/2017 11:02    Procedures Procedures (including critical care time)  Medications Ordered in ED Medications  cefTRIAXone (ROCEPHIN) 1 g in dextrose 5 % 50 mL IVPB (not administered)  dextrose  5 %-0.9 % sodium chloride infusion (not administered)  acetaminophen (TYLENOL) tablet 650 mg (not administered)  oxyCODONE (Oxy IR/ROXICODONE) immediate release tablet 5 mg (not administered)  morphine 2 MG/ML injection 2 mg (not administered)  docusate sodium (COLACE) capsule 100 mg (not administered)  senna (SENOKOT) tablet 8.6 mg (not administered)  ondansetron (ZOFRAN) injection 4 mg (not administered)  ketorolac (TORADOL) 30 MG/ML injection 30 mg (30 mg Intravenous Given 02/10/17 1127)  ondansetron (ZOFRAN) injection 4 mg (4 mg Intravenous Given 02/10/17 1127)  morphine 4 MG/ML injection 4 mg (4 mg Intravenous Given 02/10/17 1242)     Initial Impression / Assessment and Plan / ED Course  I have reviewed the triage vital signs and the nursing notes.  Pertinent labs & imaging results that  were available during my care of the patient were reviewed by me and considered in my medical decision making (see chart for details).     Patient presents with flank pain. She has a 10 mm distal ureteral stone with evidence of urinary infection. I spoke with Dr. Liliane Shi, the urologist on call to is going to admit the patient. She was given Rocephin. Her pain is controlled in the ED.  Final Clinical Impressions(s) / ED Diagnoses   Final diagnoses:  Kidney stone  Lower urinary tract infectious disease    New Prescriptions New Prescriptions   No medications on file     Rolan Bucco, MD 02/10/17 1549

## 2017-02-10 NOTE — Transfer of Care (Signed)
Immediate Anesthesia Transfer of Care Note  Patient: Denise Hudson  Procedure(s) Performed: CYSTOSCOPY WITH RIGHT STENT PLACEMENT (Right Ureter)  Patient Location: PACU  Anesthesia Type:General  Level of Consciousness: sedated and drowsy  Airway & Oxygen Therapy: Patient Spontanous Breathing and Patient connected to face mask oxygen  Post-op Assessment: Report given to RN and Post -op Vital signs reviewed and stable  Post vital signs: stable  Last Vitals:  Vitals:   02/10/17 1210 02/10/17 1740  BP: (!) 169/68 (!) 101/58  Pulse: 60 65  Resp: 17 11  Temp:  36.4 C  SpO2: 98% 100%    Last Pain:  Vitals:   02/10/17 1740  PainSc: Asleep         Complications: No apparent anesthesia complications

## 2017-02-11 ENCOUNTER — Encounter (HOSPITAL_COMMUNITY): Payer: Self-pay | Admitting: Urology

## 2017-02-11 LAB — URINE CULTURE

## 2017-02-18 NOTE — Discharge Summary (Signed)
Date of admission: 02/10/2017  Date of discharge: 02/18/2017  Admission diagnosis: Obstructing 1 cm right UVJ calculus  Discharge diagnosis: Same  Secondary diagnoses: None  History and Physical: For full details, please see admission history and physical. Briefly, Denise EvertsLeslie M Pergola is a 56 y.o. year old patient who [facility] to [facility] ED with right flank pain.  She had a CT stone study performed that demonstrated a 1 cm right UVJ stone with hydronephrosis.     Hospital Course: The patient was taken to the operating room for cystoscopy and right JJ stent placement.  She was discharged home following the procedure Laboratory values: No results for input(s): HGB, HCT in the last 72 hours. No results for input(s): CREATININE in the last 72 hours.  Disposition: Home  Discharge instruction: The patient was instructed to be ambulatory but told to refrain from heavy lifting, strenuous activity, or driving. The patient was instructed to follow-up with [facility] for definitive treatment of her right UVJ stone  Discharge medications:  Allergies as of 02/10/2017   No Known Allergies     Medication List    TAKE these medications   BC HEADACHE POWDER PO Take 1 packet by mouth daily as needed (pain).   HYDROcodone-acetaminophen 5-325 MG tablet Commonly known as:  NORCO Take 1 tablet by mouth every 4 (four) hours as needed for moderate pain.   ondansetron 4 MG tablet Commonly known as:  ZOFRAN Take 1 tablet (4 mg total) by mouth daily as needed for nausea or vomiting.     ASK your doctor about these medications   cephALEXin 500 MG capsule Commonly known as:  KEFLEX Take 1 capsule (500 mg total) by mouth 2 (two) times daily.   ciprofloxacin 500 MG tablet Commonly known as:  CIPRO Take 1 tablet (500 mg total) by mouth 2 (two) times daily. Ask about: Should I take this medication?   ondansetron 4 MG disintegrating tablet Commonly known as:  ZOFRAN ODT Take 1 tablet (4 mg total)  by mouth every 8 (eight) hours as needed for nausea or vomiting.   traMADol 50 MG tablet Commonly known as:  ULTRAM Take 1 tablet (50 mg total) by mouth every 12 (twelve) hours as needed for severe pain.       Followup:  Follow-up Information    Rene PaciWinter, Marinus Eicher Aaron, MD In 1 week.   Specialty:  Urology Contact information: [address] [place] [address] [phone]

## 2017-02-19 ENCOUNTER — Other Ambulatory Visit: Payer: Self-pay | Admitting: Urology

## 2017-02-25 ENCOUNTER — Encounter (HOSPITAL_BASED_OUTPATIENT_CLINIC_OR_DEPARTMENT_OTHER): Payer: Self-pay | Admitting: *Deleted

## 2017-02-25 NOTE — Progress Notes (Signed)
NPO AFTER MN.  ARRIVE AT 0930.  CURRENT LAB RESULTS IN CHART AND EPIC.  MAY TAKE PAIN/ NAUSEA RX IF NEEDED AM DOS W/ SIPS OF WATER.

## 2017-03-02 NOTE — H&P (Signed)
Urology Preoperative H&P   Chief Complaint: Right flank pain secondary to a kidney stone  History of Present Illness: Denise Hudson is a 56 y.o. female with 1 cm right UVJ stone.  She is s/p cysto with right JJ stent placement on 02/10/17 for her obstructing stone. She is here today to discuss definitive stone treatment. Doing well post-op. Denies fever/chills, flank pain or dysuria. Having occasional episodes of hematuria.    Past Medical History:  Diagnosis Date  . History of kidney stones   . Nephrolithiasis    left non-obstructive per CT 02-10-2017  . Right ureteral stone   . Wears contact lenses     Past Surgical History:  Procedure Laterality Date  . APPENDECTOMY  age 56  . EXTRACORPOREAL SHOCK WAVE LITHOTRIPSY  2008    Allergies: No Known Allergies  History reviewed. No pertinent family history.  Social History:  reports that she quit smoking about 3 years ago. Her smoking use included cigarettes. She quit after 20.00 years of use. she has never used smokeless tobacco. She reports that she does not drink alcohol or use drugs.  ROS: A complete review of systems was performed.  All systems are negative except for pertinent findings as noted.  Physical Exam:  Vital signs in last 24 hours:   Constitutional:  Alert and oriented, No acute distress Cardiovascular: Regular rate and rhythm, No JVD Respiratory: Normal respiratory effort, Lungs clear bilaterally GI: Abdomen is soft, nontender, nondistended, no abdominal masses GU: No CVA tenderness Lymphatic: No lymphadenopathy Neurologic: Grossly intact, no focal deficits Psychiatric: Normal mood and affect  Laboratory Data:  No results for input(s): WBC, HGB, HCT, PLT in the last 72 hours.  No results for input(s): NA, K, CL, GLUCOSE, BUN, CALCIUM, CREATININE in the last 72 hours.  Invalid input(s): CO3   No results found for this or any previous visit (from the past 24 hour(s)). No results found for this or any  previous visit (from the past 240 hour(s)).  Renal Function: No results for input(s): CREATININE in the last 168 hours. Estimated Creatinine Clearance: 50.2 mL/min (A) (by C-G formula based on SCr of 1.33 mg/dL (H)).  Radiologic Imaging: No results found.  I independently reviewed the above imaging studies.  Assessment and Plan Denise Hudson is a 56 y.o. female with a 1 cm right UVJ stone s/p JJ stent placement on 02/10/17   -The risks, benefits and alternatives of cystoscopy with right ureteroscopy, laser lithotripsy and right JJ stent placement was discussed with the patient. She voices understanding and wishes to proceed.      Rhoderick Moodyhristopher Winter, MD 03/02/2017, 1:18 PM  [facility] Pager: [phone]

## 2017-03-04 ENCOUNTER — Ambulatory Visit (HOSPITAL_BASED_OUTPATIENT_CLINIC_OR_DEPARTMENT_OTHER): Payer: BLUE CROSS/BLUE SHIELD | Admitting: Anesthesiology

## 2017-03-04 ENCOUNTER — Encounter (HOSPITAL_BASED_OUTPATIENT_CLINIC_OR_DEPARTMENT_OTHER): Payer: Self-pay | Admitting: Anesthesiology

## 2017-03-04 ENCOUNTER — Encounter (HOSPITAL_BASED_OUTPATIENT_CLINIC_OR_DEPARTMENT_OTHER)
Admission: RE | Disposition: A | Discharge status: Home / Self Care | Payer: Self-pay | Source: Ambulatory Visit | Attending: Urology

## 2017-03-04 ENCOUNTER — Ambulatory Visit (HOSPITAL_BASED_OUTPATIENT_CLINIC_OR_DEPARTMENT_OTHER)
Admission: RE | Admit: 2017-03-04 | Discharge: 2017-03-04 | Disposition: A | Discharge status: Home / Self Care | Payer: BLUE CROSS/BLUE SHIELD | Source: Ambulatory Visit | Attending: Urology | Admitting: Urology

## 2017-03-04 DIAGNOSIS — Z9889 Other specified postprocedural states: Secondary | ICD-10-CM | POA: Insufficient documentation

## 2017-03-04 DIAGNOSIS — Z87891 Personal history of nicotine dependence: Secondary | ICD-10-CM | POA: Diagnosis not present

## 2017-03-04 DIAGNOSIS — Z9049 Acquired absence of other specified parts of digestive tract: Secondary | ICD-10-CM | POA: Insufficient documentation

## 2017-03-04 DIAGNOSIS — Z87442 Personal history of urinary calculi: Secondary | ICD-10-CM | POA: Insufficient documentation

## 2017-03-04 DIAGNOSIS — K056 Periodontal disease, unspecified: Secondary | ICD-10-CM | POA: Diagnosis not present

## 2017-03-04 DIAGNOSIS — K0889 Other specified disorders of teeth and supporting structures: Secondary | ICD-10-CM | POA: Diagnosis not present

## 2017-03-04 DIAGNOSIS — N202 Calculus of kidney with calculus of ureter: Secondary | ICD-10-CM | POA: Insufficient documentation

## 2017-03-04 HISTORY — DX: Personal history of urinary calculi: Z87.442

## 2017-03-04 HISTORY — DX: Presence of spectacles and contact lenses: Z97.3

## 2017-03-04 HISTORY — PX: CYSTOSCOPY/URETEROSCOPY/HOLMIUM LASER/STENT PLACEMENT: SHX6546

## 2017-03-04 HISTORY — DX: Calculus of ureter: N20.1

## 2017-03-04 SURGERY — CYSTOSCOPY/URETEROSCOPY/HOLMIUM LASER/STENT PLACEMENT
Anesthesia: General | Site: Bladder | Laterality: Right

## 2017-03-04 MED ORDER — FENTANYL CITRATE (PF) 100 MCG/2ML IJ SOLN
INTRAMUSCULAR | Status: DC | PRN
  Administered 2017-03-04: 50 ug via INTRAVENOUS

## 2017-03-04 MED ORDER — PROPOFOL 10 MG/ML IV BOLUS
INTRAVENOUS | Status: AC
  Filled 2017-03-04: qty 20

## 2017-03-04 MED ORDER — IOHEXOL 300 MG/ML  SOLN
INTRAMUSCULAR | Status: DC | PRN
  Administered 2017-03-04: 7 mL via URETHRAL

## 2017-03-04 MED ORDER — MIDAZOLAM HCL 2 MG/2ML IJ SOLN
INTRAMUSCULAR | Status: DC | PRN
  Administered 2017-03-04: 2 mg via INTRAVENOUS

## 2017-03-04 MED ORDER — SULFAMETHOXAZOLE-TRIMETHOPRIM 800-160 MG PO TABS: 1.0000 | ORAL_TABLET | Freq: Two times a day (BID) | ORAL | 0 refills | Status: AC

## 2017-03-04 MED ORDER — FENTANYL CITRATE (PF) 100 MCG/2ML IJ SOLN
INTRAMUSCULAR | Status: AC
  Filled 2017-03-04: qty 2

## 2017-03-04 MED ORDER — SODIUM CHLORIDE 0.9 % IR SOLN
Status: DC | PRN
  Administered 2017-03-04: 1000 mL via INTRAVESICAL
  Administered 2017-03-04: 3000 mL via INTRAVESICAL

## 2017-03-04 MED ORDER — LIDOCAINE 2% (20 MG/ML) 5 ML SYRINGE
INTRAMUSCULAR | Status: DC | PRN
  Administered 2017-03-04: 60 mg via INTRAVENOUS

## 2017-03-04 MED ORDER — EPHEDRINE SULFATE 50 MG/ML IJ SOLN
INTRAMUSCULAR | Status: DC | PRN
  Administered 2017-03-04: 15 mg via INTRAVENOUS

## 2017-03-04 MED ORDER — DEXAMETHASONE SODIUM PHOSPHATE 10 MG/ML IJ SOLN
INTRAMUSCULAR | Status: AC
  Filled 2017-03-04: qty 1

## 2017-03-04 MED ORDER — HYDROCODONE-ACETAMINOPHEN 5-325 MG PO TABS: 1.0000 | ORAL_TABLET | ORAL | 0 refills | Status: DC | PRN

## 2017-03-04 MED ORDER — LACTATED RINGERS IV SOLN
INTRAVENOUS | Status: DC
  Administered 2017-03-04: 10:00:00 via INTRAVENOUS
  Filled 2017-03-04: qty 1000

## 2017-03-04 MED ORDER — LIDOCAINE 2% (20 MG/ML) 5 ML SYRINGE
INTRAMUSCULAR | Status: AC
  Filled 2017-03-04: qty 5

## 2017-03-04 MED ORDER — EPHEDRINE 5 MG/ML INJ
INTRAVENOUS | Status: AC
  Filled 2017-03-04: qty 10

## 2017-03-04 MED ORDER — DEXAMETHASONE SODIUM PHOSPHATE 10 MG/ML IJ SOLN
INTRAMUSCULAR | Status: DC | PRN
  Administered 2017-03-04: 10 mg via INTRAVENOUS

## 2017-03-04 MED ORDER — ONDANSETRON HCL 4 MG/2ML IJ SOLN
INTRAMUSCULAR | Status: AC
  Filled 2017-03-04: qty 2

## 2017-03-04 MED ORDER — MIDAZOLAM HCL 2 MG/2ML IJ SOLN
INTRAMUSCULAR | Status: AC
  Filled 2017-03-04: qty 2

## 2017-03-04 MED ORDER — CEFAZOLIN SODIUM-DEXTROSE 2-4 GM/100ML-% IV SOLN
INTRAVENOUS | Status: AC
  Filled 2017-03-04: qty 100

## 2017-03-04 MED ORDER — CEFAZOLIN SODIUM-DEXTROSE 2-4 GM/100ML-% IV SOLN
2.0000 g | Freq: Once | INTRAVENOUS | Status: AC
  Administered 2017-03-04: 2 g via INTRAVENOUS
  Filled 2017-03-04: qty 100

## 2017-03-04 MED ORDER — ONDANSETRON HCL 4 MG/2ML IJ SOLN
INTRAMUSCULAR | Status: DC | PRN
  Administered 2017-03-04: 4 mg via INTRAVENOUS

## 2017-03-04 MED ORDER — PROPOFOL 10 MG/ML IV BOLUS
INTRAVENOUS | Status: DC | PRN
  Administered 2017-03-04: 200 mg via INTRAVENOUS

## 2017-03-04 SURGICAL SUPPLY — 26 items
BAG DRAIN URO-CYSTO SKYTR STRL (DRAIN) ×3 IMPLANT
BASKET STONE 1.7 NGAGE (UROLOGICAL SUPPLIES) IMPLANT
BASKET ZERO TIP NITINOL 2.4FR (BASKET) ×3 IMPLANT
BENZOIN TINCTURE PRP APPL 2/3 (GAUZE/BANDAGES/DRESSINGS) IMPLANT
CATH INTERMIT  6FR 70CM (CATHETERS) IMPLANT
CLOSURE WOUND 1/2 X4 (GAUZE/BANDAGES/DRESSINGS)
CLOTH BEACON ORANGE TIMEOUT ST (SAFETY) ×3 IMPLANT
FIBER LASER FLEXIVA 365 (UROLOGICAL SUPPLIES) ×3 IMPLANT
FIBER LASER TRAC TIP (UROLOGICAL SUPPLIES) IMPLANT
GLOVE BIO SURGEON STRL SZ7.5 (GLOVE) ×3 IMPLANT
GOWN STRL REUS W/TWL XL LVL3 (GOWN DISPOSABLE) ×3 IMPLANT
GUIDEWIRE ANG ZIPWIRE 038X150 (WIRE) ×3 IMPLANT
GUIDEWIRE STR DUAL SENSOR (WIRE) IMPLANT
INFUSOR MANOMETER BAG 3000ML (MISCELLANEOUS) ×3 IMPLANT
IV NS 1000ML (IV SOLUTION) ×2
IV NS 1000ML BAXH (IV SOLUTION) ×1 IMPLANT
IV NS IRRIG 3000ML ARTHROMATIC (IV SOLUTION) ×3 IMPLANT
KIT RM TURNOVER CYSTO AR (KITS) ×3 IMPLANT
MANIFOLD NEPTUNE II (INSTRUMENTS) ×3 IMPLANT
NS IRRIG 500ML POUR BTL (IV SOLUTION) ×3 IMPLANT
PACK CYSTO (CUSTOM PROCEDURE TRAY) ×3 IMPLANT
STENT URET 6FRX26 CONTOUR (STENTS) ×3 IMPLANT
STRIP CLOSURE SKIN 1/2X4 (GAUZE/BANDAGES/DRESSINGS) IMPLANT
SYRINGE 10CC LL (SYRINGE) ×3 IMPLANT
TUBE CONNECTING 12'X1/4 (SUCTIONS)
TUBE CONNECTING 12X1/4 (SUCTIONS) IMPLANT

## 2017-03-04 NOTE — Anesthesia Preprocedure Evaluation (Addendum)
Anesthesia Evaluation  Patient identified by MRN, date of birth, ID band Patient awake    Reviewed: Allergy & Precautions, NPO status , Patient's Chart, lab work & pertinent test results  Airway Mallampati: II  TM Distance: >3 FB Neck ROM: Full    Dental  (+) Poor Dentition, Loose, Missing,  Significant periodontal disease and carious and loose teeth.:   Pulmonary former smoker,    Pulmonary exam normal breath sounds clear to auscultation       Cardiovascular negative cardio ROS Normal cardiovascular exam Rhythm:Regular Rate:Normal     Neuro/Psych negative neurological ROS  negative psych ROS   GI/Hepatic   Endo/Other  negative endocrine ROS  Renal/GU Renal diseaseRight ureteral calculus Hx/o nephrolithiasis  negative genitourinary   Musculoskeletal negative musculoskeletal ROS (+)   Abdominal   Peds  Hematology negative hematology ROS (+)   Anesthesia Other Findings   Reproductive/Obstetrics                           Anesthesia Physical Anesthesia Plan  ASA: II  Anesthesia Plan: General   Post-op Pain Management:    Induction: Intravenous  PONV Risk Score and Plan: 3 and Midazolam, Propofol infusion, Ondansetron, Dexamethasone and Treatment may vary due to age or medical condition  Airway Management Planned: LMA  Additional Equipment:   Intra-op Plan:   Post-operative Plan: Extubation in OR  Informed Consent: I have reviewed the patients History and Physical, chart, labs and discussed the procedure including the risks, benefits and alternatives for the proposed anesthesia with the patient or authorized representative who has indicated his/her understanding and acceptance.   Dental advisory given  Plan Discussed with: CRNA, Anesthesiologist and Surgeon  Anesthesia Plan Comments:         Anesthesia Quick Evaluation

## 2017-03-04 NOTE — Anesthesia Postprocedure Evaluation (Signed)
Anesthesia Post Note  Patient: Denise EvertsLeslie M Doolittle  Procedure(s) Performed: CYSTOSCOPY/URETEROSCOPY/HOLMIUM LASER/STENT PLACEMENT (Right Bladder)     Patient location during evaluation: PACU Anesthesia Type: General Level of consciousness: awake and alert and oriented Pain management: pain level controlled Vital Signs Assessment: post-procedure vital signs reviewed and stable Respiratory status: spontaneous breathing, nonlabored ventilation and respiratory function stable Cardiovascular status: blood pressure returned to baseline and stable Postop Assessment: no apparent nausea or vomiting Anesthetic complications: no    Last Vitals:  Vitals:   03/04/17 1230 03/04/17 1239  BP: (!) 164/74   Pulse: 63 (!) 55  Resp: 15 20  Temp:    SpO2: 100% 98%    Last Pain:  Vitals:   03/04/17 0958  TempSrc:   PainSc: 3                  Abir Craine A.

## 2017-03-04 NOTE — Anesthesia Procedure Notes (Signed)
Procedure Name: LMA Insertion Date/Time: 03/04/2017 11:23 AM Performed by: Tyrone NineSauve, Cassanda Walmer F, CRNA Pre-anesthesia Checklist: Patient identified, Timeout performed, Emergency Drugs available, Suction available and Patient being monitored Patient Re-evaluated:Patient Re-evaluated prior to induction Oxygen Delivery Method: Circle system utilized Preoxygenation: Pre-oxygenation with 100% oxygen Induction Type: IV induction Ventilation: Mask ventilation without difficulty LMA: LMA inserted LMA Size: 4.0 Number of attempts: 1 Placement Confirmation: positive ETCO2 and breath sounds checked- equal and bilateral Tube secured with: Tape Dental Injury: Teeth and Oropharynx as per pre-operative assessment

## 2017-03-04 NOTE — Op Note (Signed)
Operative Note  Preoperative diagnosis:  1.  1 cm right UVJ stone  Postoperative diagnosis: 1.  Same  Procedure(s): 1.  Cystoscopy 2.  Right JJ stent removal 3.  Right semi-rigid ureteroscopy 4.  Holmium laser lithotrispy 5.  Right JJ stent placement with tether  Surgeon: Rhoderick Moodyhristopher Ronon Ferger, MD  Assistants:  None  Anesthesia:  General LMA  Complications:  None  EBL:  <5 mL  Specimens: 1. Right UVJ stone  Drains/Catheters: 1.  Right 26F JJ stent with tether  Intraoperative findings:  1 cm right UVJ stone  Indication:  Denise Hudson is a 56 y.o. female who underwent cystoscopy with right JJ stent placement on 02/10/17 secondary to an obstructing 1 cm right UVJ stone.  She had signs of a urinary tract infection from her obstructing stone at that time.  She is here today for definitive stone treatment  Description of procedure:  After informed consent was obtained, the patient was brought to the operating room and general LMA anesthesia was administered. The patient was then placed in the dorsolithotomy position and prepped and draped in usual sterile fashion. A timeout was performed. A 21 French rigid cystoscope was then inserted into the urethral meatus and advanced into the bladder under direct vision. A complete bladder survey revealed no intravesical pathology.  Her previously placed right JJ stent was then grasped and retracted to the urethral meatus. A Glidewire was then advanced to the lumen of the stent and up to the right renal pelvis, under fluoroscopic guidance. The stent was then removed over the wire. A semirigid ureteroscope was then advanced alongside the wire and into the distal aspects of her right ureter, were the 17 m stone was identified. A 365  holmium laser was then used to fracture the stone into numerous smaller pieces. A nitinol tipples basket was then used to extract all stone fragments from the lumen of the right ureter. The semirigid ureteroscope was  then removed. A 6 [place] JJ stent was then placed over the wire and into good position within the right collecting system, confirmed by fluoroscopy. The tether the stent was then placed in the vaginal introitus. The patient's bladder was then drained and all stone fragments were removed. She tolerated the procedure well and was transferred to the postanesthesia in stable condition.  Plan: The patient has been instructed to remove her right-sided stent, which is on a tether, at 6 AM on 03/06/2017. She'll follow up in 6 weeks for a right renal ultrasound.

## 2017-03-04 NOTE — Discharge Instructions (Signed)
CYSTOSCOPY HOME CARE INSTRUCTIONS  Activity: Rest for the remainder of the day.  Do not drive or operate equipment today.  You may resume normal activities in one to two days as instructed by your physician.   Meals: Drink plenty of liquids and eat light foods such as gelatin or soup this evening.  You may return to a normal meal plan tomorrow.  Return to Work: You may return to work in one to two days or as instructed by your physician.  Special Instructions / Symptoms: Call your physician if any of these symptoms occur:   -persistent or heavy bleeding  -bleeding which continues after first few urination  -large blood clots that are difficult to pass  -urine stream diminishes or stops completely  -fever equal to or higher than 101 degrees Farenheit.  -cloudy urine with a strong, foul odor  -severe pain  Females should always wipe from front to back after elimination.  You may feel some burning pain when you urinate.  This should disappear with time.  Applying moist heat to the lower abdomen or a hot tub bath may help relieve the pain. \  Follow-Up / Date of Return Visit to Your Physician:   Call for an appointment to arrange follow-up.  Remove your stent (by pulling the string exiting your urethra) at 6 AM on 03/06/17    Post Anesthesia Home Care Instructions  Activity: Get plenty of rest for the remainder of the day. A responsible individual must stay with you for 24 hours following the procedure.  For the next 24 hours, DO NOT: -Drive a car -Advertising copywriterperate machinery -Drink alcoholic beverages -Take any medication unless instructed by your physician -Make any legal decisions or sign important papers.  Meals: Start with liquid foods such as gelatin or soup. Progress to regular foods as tolerated. Avoid greasy, spicy, heavy foods. If nausea and/or vomiting occur, drink only clear liquids until the nausea and/or vomiting subsides. Call your physician if vomiting  continues.  Special Instructions/Symptoms: Your throat may feel dry or sore from the anesthesia or the breathing tube placed in your throat during surgery. If this causes discomfort, gargle with warm salt water. The discomfort should disappear within 24 hours.  If you had a scopolamine patch placed behind your ear for the management of post- operative nausea and/or vomiting:  1. The medication in the patch is effective for 72 hours, after which it should be removed.  Wrap patch in a tissue and discard in the trash. Wash hands thoroughly with soap and water. 2. You may remove the patch earlier than 72 hours if you experience unpleasant side effects which may include dry mouth, dizziness or visual disturbances. 3. Avoid touching the patch. Wash your hands with soap and water after contact with the patch.

## 2017-03-04 NOTE — Interval H&P Note (Signed)
History and Physical Interval Note:  03/04/2017 10:17 AM  Laren EvertsLeslie M Mannis  has presented today for surgery, with the diagnosis of RIGHT URETERAL STONE  The various methods of treatment have been discussed with the patient and family. After consideration of risks, benefits and other options for treatment, the patient has consented to  Procedure(s) with comments: CYSTOSCOPY/URETEROSCOPY/HOLMIUM LASER/STENT PLACEMENT (Right) - NEEDS 30 MIN FOR PROCEDURE as a surgical intervention .  The patient's history has been reviewed, patient examined, no change in status, stable for surgery.  I have reviewed the patient's chart and labs.  Questions were answered to the patient's satisfaction.     Dorian Furnacehristopher Aaron Kihanna Kamiya

## 2017-03-04 NOTE — Transfer of Care (Signed)
Immediate Anesthesia Transfer of Care Note  Patient: Laren EvertsLeslie M Rames  Procedure(s) Performed: CYSTOSCOPY/URETEROSCOPY/HOLMIUM LASER/STENT PLACEMENT (Right )  Patient Location: PACU  Anesthesia Type:General  Level of Consciousness: awake, alert , oriented and patient cooperative  Airway & Oxygen Therapy: Patient Spontanous Breathing and Patient connected to nasal cannula oxygen  Post-op Assessment: Report given to RN and Post -op Vital signs reviewed and stable  Post vital signs: Reviewed and stable  Last Vitals:  Vitals:   03/04/17 0937  BP: (!) 176/88  Pulse: 69  Resp: 18  Temp: 36.7 C  SpO2: 100%    Last Pain:  Vitals:   03/04/17 0958  TempSrc:   PainSc: 3       Patients Stated Pain Goal: 3 (03/04/17 0958)  Complications: No apparent anesthesia complications

## 2017-03-05 ENCOUNTER — Encounter (HOSPITAL_BASED_OUTPATIENT_CLINIC_OR_DEPARTMENT_OTHER): Payer: Self-pay | Admitting: Urology

## 2018-10-09 IMAGING — CT CT RENAL STONE PROTOCOL
2 of 3 series · 16 of 46 positions shown, 18 images · non-contrast
Comparison: 01/02/2015

CLINICAL DATA: Right-sided flank pain

EXAM:
CT ABDOMEN AND PELVIS WITHOUT CONTRAST
TECHNIQUE: Multidetector CT imaging of the abdomen and pelvis was performed
following the standard protocol without IV contrast.

[Series 3: coronal · coronal · 0.74mm/px · 3 of 104 slices shown]
[im 35/104  soft-tissue]
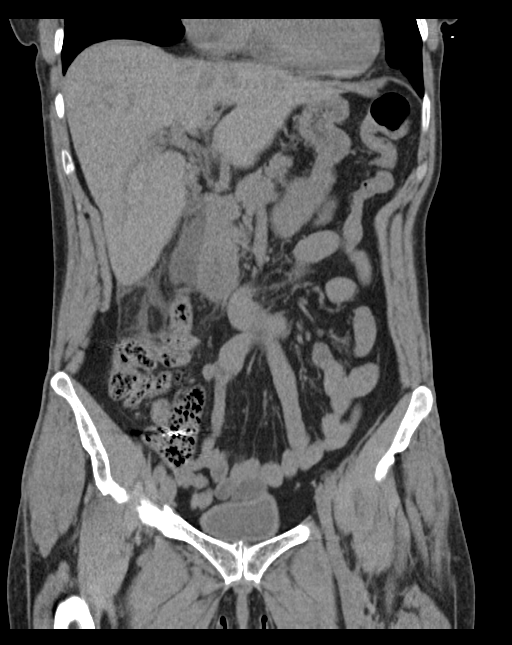
[im 46/104  soft-tissue]
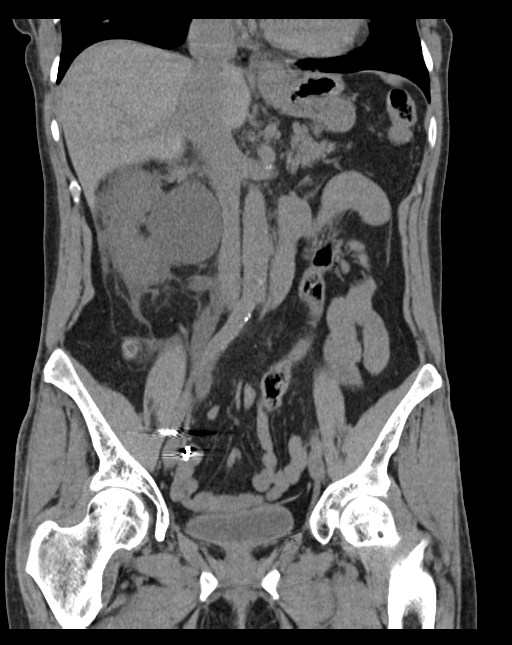
[im 58/104  soft-tissue]
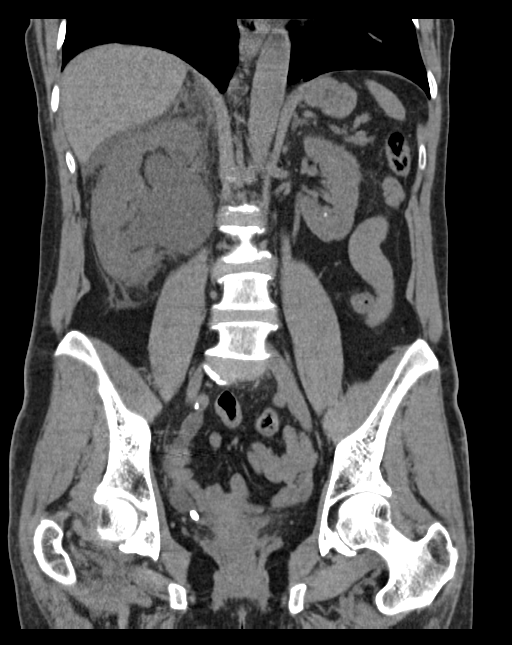

[Series 6: lung · axial · 0.76mm/px · z∈[+1632,+1710]mm · 13 of 45 slices shown, 15 images]
[im 3/45  soft-tissue]
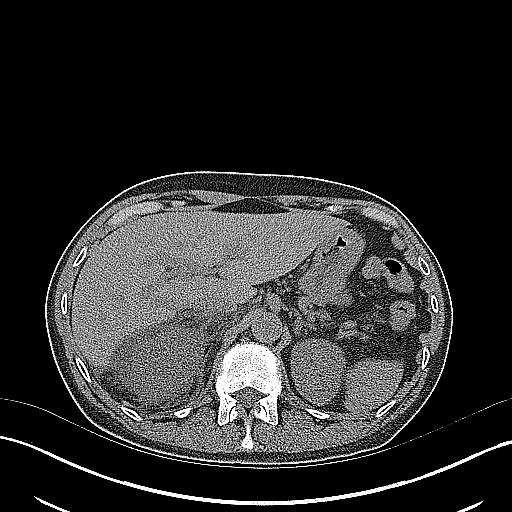
[im 3/45  bone]
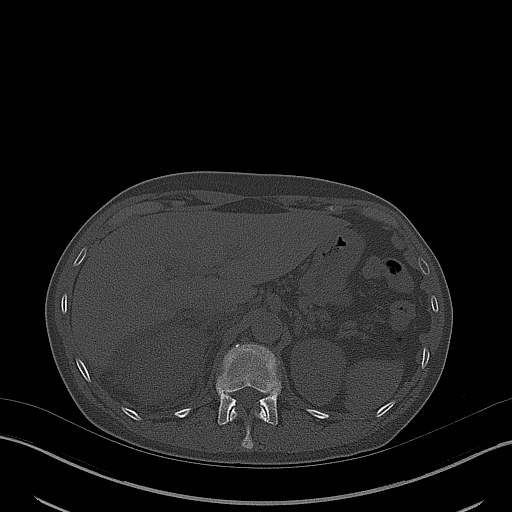
[im 6/45  soft-tissue]
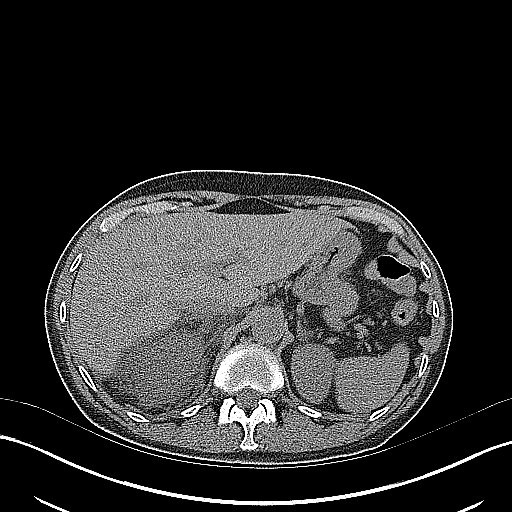
[im 9/45  soft-tissue]
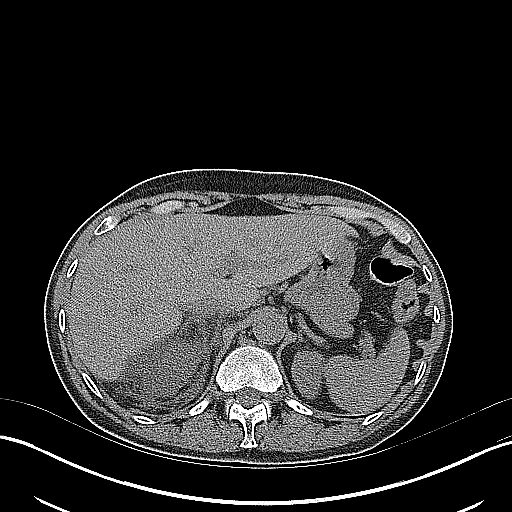
[im 13/45  soft-tissue]
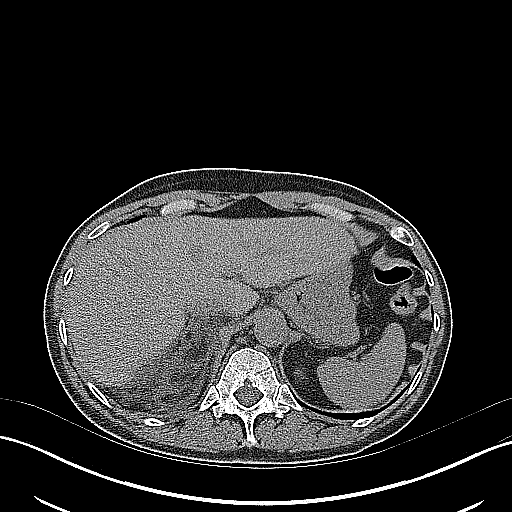
[im 16/45  soft-tissue]
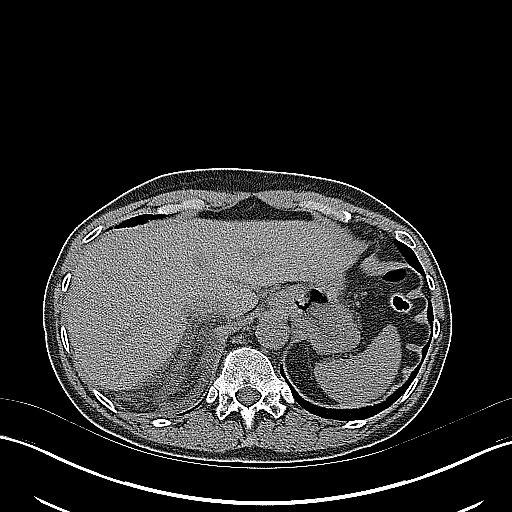
[im 19/45  soft-tissue]
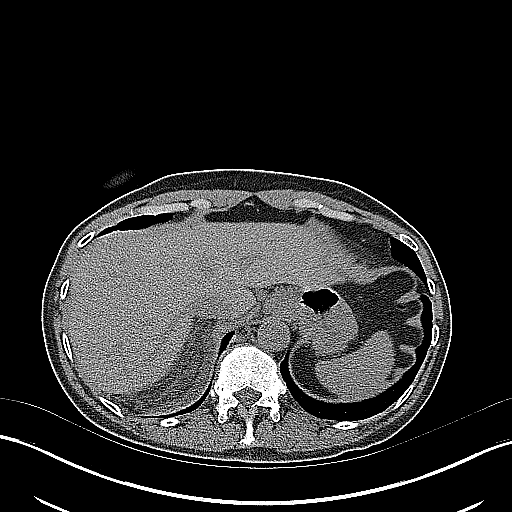
[im 23/45  soft-tissue]
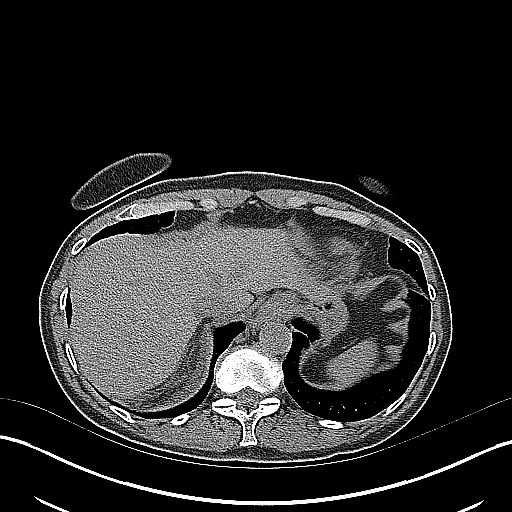
[im 26/45  soft-tissue]
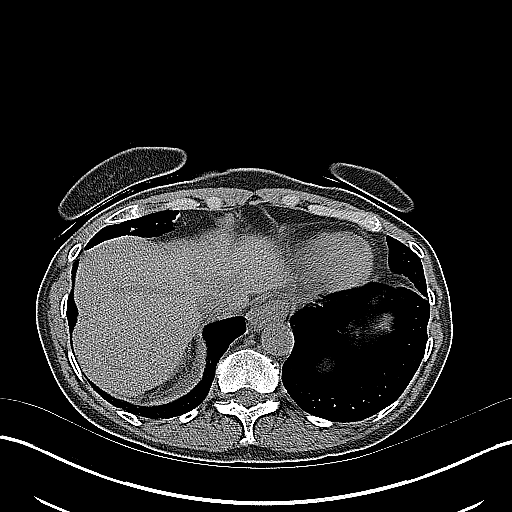
[im 29/45  soft-tissue]
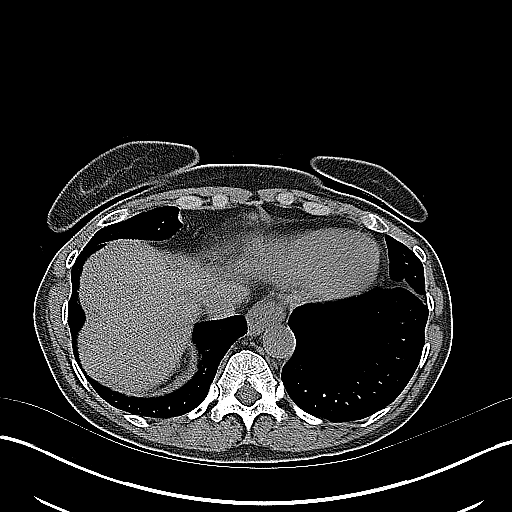
[im 29/45  bone]
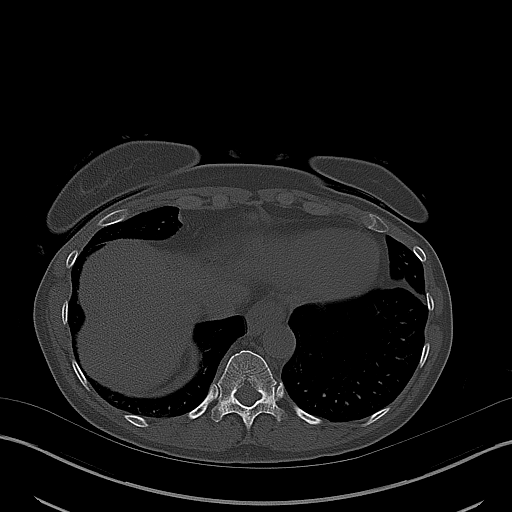
[im 32/45  soft-tissue]
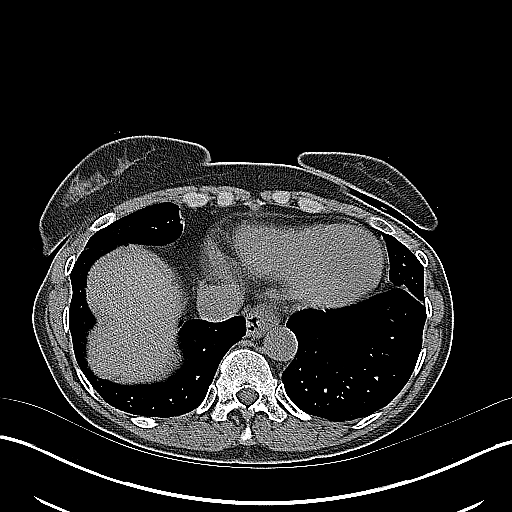
[im 36/45  soft-tissue]
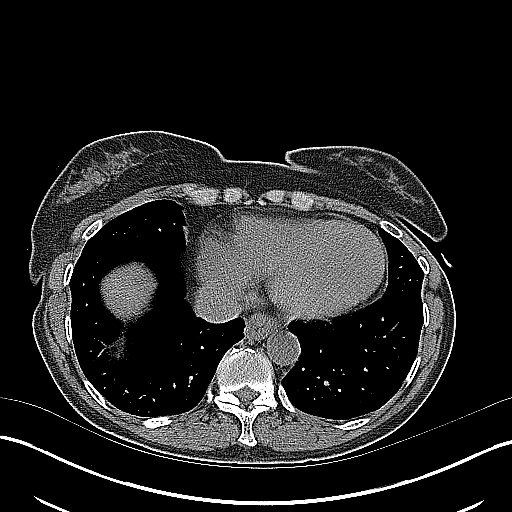
[im 39/45  soft-tissue]
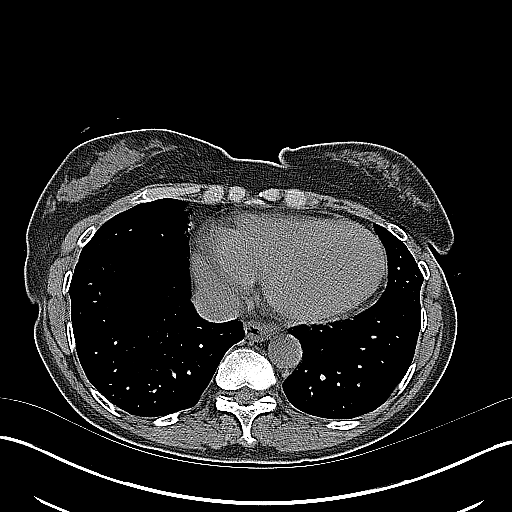
[im 42/45  soft-tissue]
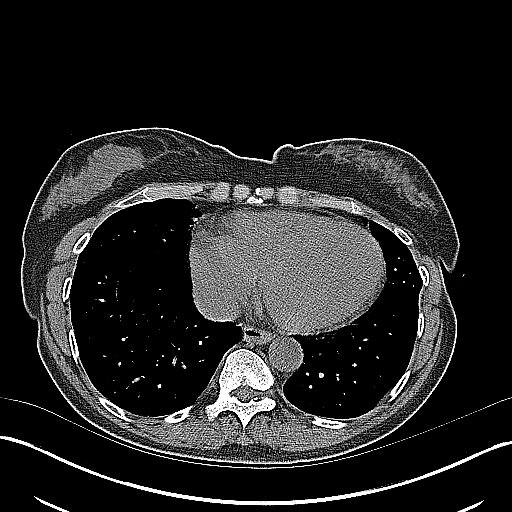

[16 of 46 positions shown; findings below may reference images not displayed]

FINDINGS: Lower chest: No acute abnormality.

Hepatobiliary: No focal liver abnormality is seen. No gallstones,
gallbladder wall thickening, or biliary dilatation.

Pancreas: Unremarkable. No pancreatic ductal dilatation or
surrounding inflammatory changes.

Spleen: Normal in size without focal abnormality.

Adrenals/Urinary Tract: The adrenal glands are within normal limits.
Multiple nonobstructing left renal stones are noted. The largest of
these lies in the lower pole measuring approximately 5 mm in
greatest dimension. Right kidney demonstrates severe hydronephrosis
and hydroureter extending inferiorly to the level of the distal
ureter just above the UVJ. Considerable perinephric stranding is
noted on the right. There is a 10 mm stone identified in the distal
right ureter causing the obstructive change. The bladder is
partially decompressed.

Stomach/Bowel: Stomach is within normal limits. Appendix appears to
have been surgically removed. No evidence of bowel wall thickening,
distention, or inflammatory changes.

Vascular/Lymphatic: Aortic atherosclerosis. No enlarged abdominal or
pelvic lymph nodes.

Reproductive: Uterus and bilateral adnexa are unremarkable.

Other: No abdominal wall hernia or abnormality. No abdominopelvic
ascites.

Musculoskeletal: No acute or significant osseous findings.
IMPRESSION: Distal right ureteral stone measuring 10 mm with considerable
hydronephrosis and hydroureter.

Nonobstructing left renal stones.

No other focal abnormality is noted.

## 2019-03-01 ENCOUNTER — Other Ambulatory Visit: Payer: Self-pay

## 2019-03-01 DIAGNOSIS — Z20822 Contact with and (suspected) exposure to covid-19: Secondary | ICD-10-CM

## 2019-03-03 LAB — NOVEL CORONAVIRUS, NAA: SARS-CoV-2, NAA: DETECTED — AB

## 2019-10-18 ENCOUNTER — Other Ambulatory Visit: Payer: Self-pay

## 2019-10-18 ENCOUNTER — Encounter (HOSPITAL_COMMUNITY): Payer: Self-pay | Admitting: Emergency Medicine

## 2019-10-18 ENCOUNTER — Emergency Department (HOSPITAL_COMMUNITY)
Admission: EM | Admit: 2019-10-18 | Discharge: 2019-10-19 | Disposition: A | Discharge status: Home / Self Care | Payer: BC Managed Care – PPO | Attending: Emergency Medicine | Admitting: Emergency Medicine

## 2019-10-18 ENCOUNTER — Emergency Department (HOSPITAL_COMMUNITY): Payer: BC Managed Care – PPO

## 2019-10-18 DIAGNOSIS — Z87891 Personal history of nicotine dependence: Secondary | ICD-10-CM | POA: Diagnosis not present

## 2019-10-18 DIAGNOSIS — N200 Calculus of kidney: Secondary | ICD-10-CM | POA: Diagnosis not present

## 2019-10-18 DIAGNOSIS — M545 Low back pain: Secondary | ICD-10-CM | POA: Insufficient documentation

## 2019-10-18 DIAGNOSIS — R1110 Vomiting, unspecified: Secondary | ICD-10-CM | POA: Insufficient documentation

## 2019-10-18 DIAGNOSIS — R300 Dysuria: Secondary | ICD-10-CM | POA: Diagnosis present

## 2019-10-18 LAB — URINALYSIS, ROUTINE W REFLEX MICROSCOPIC
Bilirubin Urine: NEGATIVE
Glucose, UA: NEGATIVE mg/dL
Ketones, ur: NEGATIVE mg/dL
Nitrite: NEGATIVE
Protein, ur: 100 mg/dL — AB
RBC / HPF: >50 RBC/hpf — ABNORMAL HIGH (ref 0–5)
Specific Gravity, Urine: 1.017 (ref 1.005–1.030)
pH: 5 (ref 5.0–8.0)

## 2019-10-18 LAB — COMPREHENSIVE METABOLIC PANEL
ALT: 24 U/L (ref 0–44)
AST: 30 U/L (ref 15–41)
Albumin: 4.3 g/dL (ref 3.5–5.0)
Alkaline Phosphatase: 94 U/L (ref 38–126)
Anion gap: 18 — ABNORMAL HIGH (ref 5–15)
BUN: 20 mg/dL (ref 6–20)
CO2: 20 mmol/L — ABNORMAL LOW (ref 22–32)
Calcium: 9.5 mg/dL (ref 8.9–10.3)
Chloride: 102 mmol/L (ref 98–111)
Creatinine, Ser: 1.2 mg/dL — ABNORMAL HIGH (ref 0.44–1.00)
GFR calc Af Amer: 58 mL/min — ABNORMAL LOW (ref >60)
GFR calc non Af Amer: 50 mL/min — ABNORMAL LOW (ref >60)
Glucose, Bld: 126 mg/dL — ABNORMAL HIGH (ref 70–99)
Potassium: 3.7 mmol/L (ref 3.5–5.1)
Sodium: 140 mmol/L (ref 135–145)
Total Bilirubin: 0.9 mg/dL (ref 0.3–1.2)
Total Protein: 8 g/dL (ref 6.5–8.1)

## 2019-10-18 LAB — CBC
HCT: 47.6 % — ABNORMAL HIGH (ref 36.0–46.0)
Hemoglobin: 14.5 g/dL (ref 12.0–15.0)
MCH: 25.1 pg — ABNORMAL LOW (ref 26.0–34.0)
MCHC: 30.5 g/dL (ref 30.0–36.0)
MCV: 82.4 fL (ref 80.0–100.0)
Platelets: 253 10*3/uL (ref 150–400)
RBC: 5.78 MIL/uL — ABNORMAL HIGH (ref 3.87–5.11)
RDW: 14.1 % (ref 11.5–15.5)
WBC: 9.3 10*3/uL (ref 4.0–10.5)
nRBC: 0 % (ref 0.0–0.2)

## 2019-10-18 LAB — I-STAT BETA HCG BLOOD, ED (MC, WL, AP ONLY): I-stat hCG, quantitative: <5 m[IU]/mL (ref <5)

## 2019-10-18 LAB — LIPASE, BLOOD: Lipase: 22 U/L (ref 11–51)

## 2019-10-18 MED ORDER — MORPHINE SULFATE (PF) 4 MG/ML IV SOLN
4.0000 mg | Freq: Once | INTRAVENOUS | Status: AC
  Administered 2019-10-18: 4 mg via INTRAVENOUS
  Filled 2019-10-18: qty 1

## 2019-10-18 MED ORDER — SODIUM CHLORIDE 0.9 % IV BOLUS
1000.0000 mL | Freq: Once | INTRAVENOUS | Status: AC
  Administered 2019-10-18: 1000 mL via INTRAVENOUS

## 2019-10-18 MED ORDER — KETOROLAC TROMETHAMINE 30 MG/ML IJ SOLN
30.0000 mg | Freq: Once | INTRAMUSCULAR | Status: AC
  Administered 2019-10-19: 30 mg via INTRAVENOUS
  Filled 2019-10-18: qty 1

## 2019-10-18 MED ORDER — SODIUM CHLORIDE 0.9% FLUSH: 3.0000 mL | Freq: Once | INTRAVENOUS | Status: DC

## 2019-10-18 MED ORDER — TAMSULOSIN HCL 0.4 MG PO CAPS: 0.4000 mg | ORAL_CAPSULE | Freq: Every day | ORAL | 0 refills | Status: DC

## 2019-10-18 MED ORDER — ONDANSETRON HCL 4 MG/2ML IJ SOLN
4.0000 mg | Freq: Once | INTRAMUSCULAR | Status: AC
  Administered 2019-10-18: 4 mg via INTRAVENOUS
  Filled 2019-10-18: qty 2

## 2019-10-18 MED ORDER — OXYCODONE-ACETAMINOPHEN 5-325 MG PO TABS: 1.0000 | ORAL_TABLET | ORAL | 0 refills | Status: DC | PRN

## 2019-10-18 MED ORDER — ONDANSETRON 4 MG PO TBDP: 4.0000 mg | ORAL_TABLET | Freq: Three times a day (TID) | ORAL | 0 refills | Status: DC | PRN

## 2019-10-18 NOTE — ED Provider Notes (Addendum)
[facility] DEPT Provider Note   CSN: [id number] Arrival date & time: 10/18/19  1224     History Chief Complaint  Patient presents with  . Dysuria  . Back Pain  . Emesis    Denise Hudson is a 59 y.o. female.  Pt presents to the ED today with flank pain and n/v.  Pt said it feels like a UTI although she's had kidney stones in the past.  No f/c.          Past Medical History:  Diagnosis Date  . History of kidney stones   . Nephrolithiasis    left non-obstructive per CT 02-10-2017  . Right ureteral stone   . Wears contact lenses     Patient Active Problem List   Diagnosis Date Noted  . Nephrolith 02/10/2017    Past Surgical History:  Procedure Laterality Date  . APPENDECTOMY  age 41  . CYSTOSCOPY WITH STENT PLACEMENT Right 02/10/2017   Procedure: CYSTOSCOPY WITH RIGHT STENT PLACEMENT;  Surgeon: Ceasar Mons, MD;  Location: [facility];  Service: Urology;  Laterality: Right;  . CYSTOSCOPY/URETEROSCOPY/HOLMIUM LASER/STENT PLACEMENT Right 03/04/2017   Procedure: CYSTOSCOPY/URETEROSCOPY/HOLMIUM LASER/STENT PLACEMENT;  Surgeon: Ceasar Mons, MD;  Location: [facility];  Service: Urology;  Laterality: Right;  NEEDS 30 MIN FOR PROCEDURE  . EXTRACORPOREAL SHOCK WAVE LITHOTRIPSY  2008     OB History   No obstetric history on file.     No family history on file.  Social History   Tobacco Use  . Smoking status: Former Smoker    Years: 20.00    Types: Cigarettes    Quit date: 02/25/2014    Years since quitting: 5.6  . Smokeless tobacco: Never Used  Vaping Use  . Vaping Use: Never used  Substance Use Topics  . Alcohol use: No  . Drug use: No    Home Medications Prior to Admission medications   Medication Sig Start Date End Date Taking? Authorizing Provider  Aspirin-Salicylamide-Caffeine (BC HEADACHE POWDER PO) Take 1 packet by mouth daily as needed (pain).     [provider]    ondansetron (ZOFRAN ODT) 4 MG disintegrating tablet Take 1 tablet (4 mg total) by mouth every 8 (eight) hours as needed. 10/18/19   Isla Pence, MD  oxyCODONE-acetaminophen (PERCOCET/ROXICET) 5-325 MG tablet Take 1 tablet by mouth every 4 (four) hours as needed for severe pain. 10/18/19   Isla Pence, MD  tamsulosin (FLOMAX) 0.4 MG CAPS capsule Take 1 capsule (0.4 mg total) by mouth daily. 10/18/19   Isla Pence, MD    Allergies    Patient has no known allergies.  Review of Systems   Review of Systems  Gastrointestinal: Positive for abdominal pain, nausea and vomiting.  All other systems reviewed and are negative.   Physical Exam Updated Vital Signs BP (!) 177/80 (BP Location: Left Arm)   Pulse (!) 58   Temp 98.7 F (37.1 C) (Oral)   Resp 18   LMP 09/23/2015   SpO2 100%   Physical Exam Vitals and nursing note reviewed.  Constitutional:      Appearance: Normal appearance.  HENT:     Head: Normocephalic and atraumatic.     Right Ear: External ear normal.     Left Ear: External ear normal.     Nose: Nose normal.     Mouth/Throat:     Mouth: Mucous membranes are dry.     Pharynx: Oropharynx is clear.  Eyes:  Extraocular Movements: Extraocular movements intact.     Conjunctiva/sclera: Conjunctivae normal.     Pupils: Pupils are equal, round, and reactive to light.  Cardiovascular:     Rate and Rhythm: Normal rate and regular rhythm.     Pulses: Normal pulses.     Heart sounds: Normal heart sounds.  Pulmonary:     Effort: Pulmonary effort is normal.     Breath sounds: Normal breath sounds.  Abdominal:     General: Abdomen is flat. Bowel sounds are normal.     Palpations: Abdomen is soft.  Musculoskeletal:        General: Normal range of motion.     Cervical back: Normal range of motion and neck supple.  Skin:    General: Skin is warm.     Capillary Refill: Capillary refill takes less than 2 seconds.  Neurological:     General: No focal deficit present.      Mental Status: She is alert and oriented to person, place, and time.  Psychiatric:        Mood and Affect: Mood normal.        Behavior: Behavior normal.        Thought Content: Thought content normal.        Judgment: Judgment normal.     ED Results / Procedures / Treatments   Labs (all labs ordered are listed, but only abnormal results are displayed) Labs Reviewed  COMPREHENSIVE METABOLIC PANEL - Abnormal; Notable for the following components:      Result Value   CO2 20 (*)    Glucose, Bld 126 (*)    Creatinine, Ser 1.20 (*)    GFR calc non Af Amer 50 (*)    GFR calc Af Amer 58 (*)    Anion gap 18 (*)    All other components within normal limits  CBC - Abnormal; Notable for the following components:   RBC 5.78 (*)    HCT 47.6 (*)    MCH 25.1 (*)    All other components within normal limits  URINALYSIS, ROUTINE W REFLEX MICROSCOPIC - Abnormal; Notable for the following components:   APPearance HAZY (*)    Hgb urine dipstick MODERATE (*)    Protein, ur 100 (*)    Leukocytes,Ua MODERATE (*)    RBC / HPF >50 (*)    Bacteria, UA RARE (*)    All other components within normal limits  URINE CULTURE  LIPASE, BLOOD  I-STAT BETA HCG BLOOD, ED ([facility], [facility], [facility] ONLY)    EKG None  Radiology CT Renal Stone Study  Result Date: 10/18/2019 CLINICAL DATA:  Left flank pain dysuria EXAM: CT ABDOMEN AND PELVIS WITHOUT CONTRAST TECHNIQUE: Multidetector CT imaging of the abdomen and pelvis was performed following the standard protocol without IV contrast. COMPARISON:  CT 02/10/2017 FINDINGS: Lower chest: No acute abnormality. Hepatobiliary: No focal liver abnormality is seen. No gallstones, gallbladder wall thickening, or biliary dilatation. Pancreas: Unremarkable. No pancreatic ductal dilatation or surrounding inflammatory changes. Spleen: Normal in size without focal abnormality. Adrenals/Urinary Tract: Adrenal glands are normal. Moderate left perinephric fat stranding with small amount of  perinephric fluid. Multiple bilateral intrarenal stones measuring up to 7 mm lower pole right and 4 mm lower pole left. Mild left hydronephrosis and hydroureter, secondary to probable 5 mm distal ureteral stone several cm proximal to the left UVJ. Mild urothelial thickening on the left. Stomach/Bowel: Stomach is within normal limits. Appendix not well seen but no right lower quadrant inflammatory process.  No evidence of bowel wall thickening, distention, or inflammatory changes. Vascular/Lymphatic: Mild aortic atherosclerosis. No aneurysm. No suspicious nodes. Reproductive: Uterus and bilateral adnexa are unremarkable. Other: Negative for free air or free fluid. Clips in the right pelvis Musculoskeletal: No acute or significant osseous findings. IMPRESSION: 1. Mild left hydronephrosis and hydroureter, secondary to probable 5 mm distal ureteral stone several cm proximal to the left UVJ. Moderate left perinephric fat stranding and small amount of perinephric fluid, possible forniceal rupture. 2. Multiple bilateral intrarenal stones. Aortic Atherosclerosis (ICD10-I70.0). Electronically Signed   By: Jasmine Pang M.D.   On: 10/18/2019 22:27    Procedures Procedures (including critical care time)  Medications Ordered in ED Medications  sodium chloride flush (NS) 0.9 % injection 3 mL (has no administration in time range)  ketorolac (TORADOL) 30 MG/ML injection 30 mg (has no administration in time range)  sodium chloride 0.9 % bolus 1,000 mL (1,000 mLs Intravenous New Bag/Given 10/18/19 2234)  ondansetron (ZOFRAN) injection 4 mg (4 mg Intravenous Given 10/18/19 2234)  morphine 4 MG/ML injection 4 mg (4 mg Intravenous Given 10/18/19 2232)    ED Course  I have reviewed the triage vital signs and the nursing notes.  Pertinent labs & imaging results that were available during my care of the patient were reviewed by me and considered in my medical decision making (see chart for details).    MDM  Rules/Calculators/A&P                          Pt is feeling much better.  She has a left sided ureteral stone with mild hydro.   CT reading does show a possible forniceal rupture.  I reviewed this with Dr. Berneice Heinrich and he said that as long as she does not appear toxic, she can go home and they can follow in the office. Pt looks good.    Urine does not appear to be infected, but it will be sent for cx.  Pt is to follow with urology.  Return if worse.  Final Clinical Impression(s) / ED Diagnoses Final diagnoses:  Kidney stone on left side    Rx / DC Orders ED Discharge Orders         Ordered    oxyCODONE-acetaminophen (PERCOCET/ROXICET) 5-325 MG tablet  Every 4 hours PRN     Discontinue  Reprint     10/18/19 2247    ondansetron (ZOFRAN ODT) 4 MG disintegrating tablet  Every 8 hours PRN     Discontinue  Reprint     10/18/19 2247    tamsulosin (FLOMAX) 0.4 MG CAPS capsule  Daily     Discontinue  Reprint     10/18/19 2247           Jacalyn Lefevre, MD 10/18/19 2249    Jacalyn Lefevre, MD 10/18/19 2316

## 2019-10-18 NOTE — ED Triage Notes (Signed)
Pt states around midnight had bladder pains, dysuria, back pains and vomiting. Believes had a UTI as symptoms are similar to had before.

## 2019-10-21 LAB — URINE CULTURE

## 2022-07-12 ENCOUNTER — Other Ambulatory Visit: Payer: Self-pay

## 2022-07-12 ENCOUNTER — Emergency Department (HOSPITAL_COMMUNITY): Payer: BC Managed Care – PPO

## 2022-07-12 ENCOUNTER — Encounter (HOSPITAL_COMMUNITY): Payer: Self-pay | Admitting: Emergency Medicine

## 2022-07-12 ENCOUNTER — Inpatient Hospital Stay (HOSPITAL_COMMUNITY)
Admission: EM | Admit: 2022-07-12 | Discharge: 2022-07-19 | DRG: 660 | Disposition: A | Discharge status: Home / Self Care | Payer: BC Managed Care – PPO | Attending: Family Medicine | Admitting: Family Medicine

## 2022-07-12 DIAGNOSIS — R509 Fever, unspecified: Secondary | ICD-10-CM | POA: Diagnosis not present

## 2022-07-12 DIAGNOSIS — D649 Anemia, unspecified: Secondary | ICD-10-CM | POA: Insufficient documentation

## 2022-07-12 DIAGNOSIS — N201 Calculus of ureter: Secondary | ICD-10-CM | POA: Diagnosis not present

## 2022-07-12 DIAGNOSIS — B9620 Unspecified Escherichia coli [E. coli] as the cause of diseases classified elsewhere: Secondary | ICD-10-CM | POA: Diagnosis not present

## 2022-07-12 DIAGNOSIS — I10 Essential (primary) hypertension: Secondary | ICD-10-CM | POA: Diagnosis not present

## 2022-07-12 DIAGNOSIS — R945 Abnormal results of liver function studies: Secondary | ICD-10-CM | POA: Diagnosis not present

## 2022-07-12 DIAGNOSIS — N132 Hydronephrosis with renal and ureteral calculous obstruction: Secondary | ICD-10-CM | POA: Diagnosis not present

## 2022-07-12 DIAGNOSIS — D509 Iron deficiency anemia, unspecified: Secondary | ICD-10-CM

## 2022-07-12 DIAGNOSIS — Z7982 Long term (current) use of aspirin: Secondary | ICD-10-CM

## 2022-07-12 DIAGNOSIS — R7401 Elevation of levels of liver transaminase levels: Secondary | ICD-10-CM | POA: Insufficient documentation

## 2022-07-12 DIAGNOSIS — Z9049 Acquired absence of other specified parts of digestive tract: Secondary | ICD-10-CM

## 2022-07-12 DIAGNOSIS — N12 Tubulo-interstitial nephritis, not specified as acute or chronic: Secondary | ICD-10-CM | POA: Diagnosis not present

## 2022-07-12 DIAGNOSIS — N136 Pyonephrosis: Secondary | ICD-10-CM | POA: Diagnosis not present

## 2022-07-12 DIAGNOSIS — N179 Acute kidney failure, unspecified: Secondary | ICD-10-CM | POA: Diagnosis not present

## 2022-07-12 DIAGNOSIS — Z87448 Personal history of other diseases of urinary system: Secondary | ICD-10-CM

## 2022-07-12 DIAGNOSIS — N390 Urinary tract infection, site not specified: Secondary | ICD-10-CM | POA: Diagnosis not present

## 2022-07-12 DIAGNOSIS — Z87440 Personal history of urinary (tract) infections: Secondary | ICD-10-CM | POA: Diagnosis not present

## 2022-07-12 DIAGNOSIS — Z79899 Other long term (current) drug therapy: Secondary | ICD-10-CM | POA: Diagnosis not present

## 2022-07-12 DIAGNOSIS — Z87891 Personal history of nicotine dependence: Secondary | ICD-10-CM

## 2022-07-12 DIAGNOSIS — N202 Calculus of kidney with calculus of ureter: Principal | ICD-10-CM | POA: Diagnosis present

## 2022-07-12 DIAGNOSIS — N3289 Other specified disorders of bladder: Secondary | ICD-10-CM | POA: Diagnosis not present

## 2022-07-12 HISTORY — DX: Iron deficiency anemia, unspecified: D50.9

## 2022-07-12 HISTORY — DX: Calculus of ureter: N20.1

## 2022-07-12 HISTORY — DX: Personal history of other diseases of urinary system: Z87.448

## 2022-07-12 LAB — CBC WITH DIFFERENTIAL/PLATELET
Abs Immature Granulocytes: 0.31 10*3/uL — ABNORMAL HIGH (ref 0.00–0.07)
Basophils Absolute: 0.1 10*3/uL (ref 0.0–0.1)
Basophils Relative: 1 %
Eosinophils Absolute: 0 10*3/uL (ref 0.0–0.5)
Eosinophils Relative: 0 %
HCT: 37.2 % (ref 36.0–46.0)
Hemoglobin: 11.6 g/dL — ABNORMAL LOW (ref 12.0–15.0)
Immature Granulocytes: 3 %
Lymphocytes Relative: 14 %
Lymphs Abs: 1.5 10*3/uL (ref 0.7–4.0)
MCH: 23.6 pg — ABNORMAL LOW (ref 26.0–34.0)
MCHC: 31.2 g/dL (ref 30.0–36.0)
MCV: 75.6 fL — ABNORMAL LOW (ref 80.0–100.0)
Monocytes Absolute: 1.9 10*3/uL — ABNORMAL HIGH (ref 0.1–1.0)
Monocytes Relative: 18 %
Neutro Abs: 6.8 10*3/uL (ref 1.7–7.7)
Neutrophils Relative %: 64 %
Platelets: 279 10*3/uL (ref 150–400)
RBC: 4.92 MIL/uL (ref 3.87–5.11)
RDW: 14 % (ref 11.5–15.5)
WBC: 10.5 10*3/uL (ref 4.0–10.5)
nRBC: 0 % (ref 0.0–0.2)

## 2022-07-12 LAB — COMPREHENSIVE METABOLIC PANEL
ALT: 93 U/L — ABNORMAL HIGH (ref 0–44)
AST: 78 U/L — ABNORMAL HIGH (ref 15–41)
Albumin: 3.1 g/dL — ABNORMAL LOW (ref 3.5–5.0)
Alkaline Phosphatase: 182 U/L — ABNORMAL HIGH (ref 38–126)
Anion gap: 13 (ref 5–15)
BUN: 34 mg/dL — ABNORMAL HIGH (ref 6–20)
CO2: 23 mmol/L (ref 22–32)
Calcium: 9 mg/dL (ref 8.9–10.3)
Chloride: 99 mmol/L (ref 98–111)
Creatinine, Ser: 1.13 mg/dL — ABNORMAL HIGH (ref 0.44–1.00)
GFR, Estimated: 56 mL/min — ABNORMAL LOW (ref >60)
Glucose, Bld: 114 mg/dL — ABNORMAL HIGH (ref 70–99)
Potassium: 3.6 mmol/L (ref 3.5–5.1)
Sodium: 135 mmol/L (ref 135–145)
Total Bilirubin: 1.9 mg/dL — ABNORMAL HIGH (ref 0.3–1.2)
Total Protein: 8.2 g/dL — ABNORMAL HIGH (ref 6.5–8.1)

## 2022-07-12 LAB — URINALYSIS, ROUTINE W REFLEX MICROSCOPIC
Bilirubin Urine: NEGATIVE
Glucose, UA: NEGATIVE mg/dL
Ketones, ur: NEGATIVE mg/dL
Nitrite: NEGATIVE
Protein, ur: 30 mg/dL — AB
Specific Gravity, Urine: 1.032 — ABNORMAL HIGH (ref 1.005–1.030)
WBC, UA: >50 WBC/hpf (ref 0–5)
pH: 5 (ref 5.0–8.0)

## 2022-07-12 MED ORDER — IOHEXOL 300 MG/ML  SOLN: 100.0000 mL | Freq: Once | INTRAMUSCULAR | Status: DC | PRN

## 2022-07-12 MED ORDER — LACTATED RINGERS IV SOLN: INTRAVENOUS | Status: AC

## 2022-07-12 MED ORDER — ONDANSETRON HCL 4 MG PO TABS: 4.0000 mg | ORAL_TABLET | Freq: Four times a day (QID) | ORAL | Status: DC | PRN

## 2022-07-12 MED ORDER — KETOROLAC TROMETHAMINE 15 MG/ML IJ SOLN
15.0000 mg | Freq: Once | INTRAMUSCULAR | Status: AC
  Administered 2022-07-12: 15 mg via INTRAVENOUS
  Filled 2022-07-12: qty 1

## 2022-07-12 MED ORDER — KETOROLAC TROMETHAMINE 15 MG/ML IJ SOLN
15.0000 mg | Freq: Three times a day (TID) | INTRAMUSCULAR | Status: AC | PRN
  Administered 2022-07-13: 15 mg via INTRAVENOUS
  Filled 2022-07-12: qty 1

## 2022-07-12 MED ORDER — IOHEXOL 300 MG/ML  SOLN
100.0000 mL | Freq: Once | INTRAMUSCULAR | Status: AC | PRN
  Administered 2022-07-12: 100 mL via INTRAVENOUS

## 2022-07-12 MED ORDER — ACETAMINOPHEN 650 MG RE SUPP: 650.0000 mg | Freq: Four times a day (QID) | RECTAL | Status: DC | PRN

## 2022-07-12 MED ORDER — ONDANSETRON HCL 4 MG/2ML IJ SOLN: 4.0000 mg | Freq: Four times a day (QID) | INTRAMUSCULAR | Status: DC | PRN

## 2022-07-12 MED ORDER — HYDROMORPHONE HCL 1 MG/ML IJ SOLN
0.5000 mg | Freq: Once | INTRAMUSCULAR | Status: AC
  Administered 2022-07-12: 0.5 mg via INTRAVENOUS
  Filled 2022-07-12: qty 1

## 2022-07-12 MED ORDER — SODIUM CHLORIDE 0.9 % IV SOLN
1.0000 g | INTRAVENOUS | Status: DC
  Administered 2022-07-13 – 2022-07-14 (×2): 1 g via INTRAVENOUS
  Filled 2022-07-12 (×2): qty 10

## 2022-07-12 MED ORDER — HYDROMORPHONE HCL 1 MG/ML IJ SOLN
0.5000 mg | INTRAMUSCULAR | Status: DC | PRN
  Administered 2022-07-12 – 2022-07-16 (×13): 1 mg via INTRAVENOUS
  Filled 2022-07-12 (×13): qty 1

## 2022-07-12 MED ORDER — ACETAMINOPHEN 325 MG PO TABS
650.0000 mg | ORAL_TABLET | Freq: Four times a day (QID) | ORAL | Status: DC | PRN
  Administered 2022-07-14 – 2022-07-15 (×2): 650 mg via ORAL
  Filled 2022-07-12 (×2): qty 2

## 2022-07-12 MED ORDER — SODIUM CHLORIDE 0.9 % IV SOLN
1.0000 g | Freq: Once | INTRAVENOUS | Status: AC
  Administered 2022-07-12: 1 g via INTRAVENOUS
  Filled 2022-07-12: qty 10

## 2022-07-12 MED ORDER — ONDANSETRON HCL 4 MG/2ML IJ SOLN
4.0000 mg | Freq: Once | INTRAMUSCULAR | Status: AC
  Administered 2022-07-12: 4 mg via INTRAVENOUS
  Filled 2022-07-12: qty 2

## 2022-07-12 MED ORDER — SODIUM CHLORIDE 0.9 % IV BOLUS
2000.0000 mL | Freq: Once | INTRAVENOUS | Status: AC
  Administered 2022-07-12: 2000 mL via INTRAVENOUS

## 2022-07-12 NOTE — ED Provider Notes (Signed)
[facility] Provider Note   CSN: [id number] Arrival date & time: 07/12/22  1636     History  Chief Complaint  Patient presents with   Hematuria    Denise Hudson is a 62 y.o. female, history of nephrolithiasis, recurrent UTIs, who presents to the ED secondary to right lower quadrant pain, radiating to the back, for the last day, that feels pressurized in nature, and is associated with nausea and vomiting.  She has vomited multiple times over the last 24 hours, and cannot keep anything down.  States that her voice feels very hoarse, because she cannot stop vomiting.  Denies any increased frequency or urgency of urination, but does note that it has been bright red in nature.  Denies any vaginal discharge, concern for STDs.  States the pain is a 10 out of 10, and she feels like she is going to vomit.  Has not had a kidney stone last couple years.  Denies any fevers.  Endorses chills.     Home Medications Prior to Admission medications   Medication Sig Start Date End Date Taking? Authorizing Provider  Aspirin-Salicylamide-Caffeine (BC HEADACHE POWDER PO) Take 1 packet by mouth daily as needed (pain).     [provider]  ondansetron (ZOFRAN ODT) 4 MG disintegrating tablet Take 1 tablet (4 mg total) by mouth every 8 (eight) hours as needed. 10/18/19   Isla Pence, MD  oxyCODONE-acetaminophen (PERCOCET/ROXICET) 5-325 MG tablet Take 1 tablet by mouth every 4 (four) hours as needed for severe pain. 10/18/19   Isla Pence, MD  tamsulosin (FLOMAX) 0.4 MG CAPS capsule Take 1 capsule (0.4 mg total) by mouth daily. 10/18/19   Isla Pence, MD      Allergies    Patient has no known allergies.    Review of Systems   Review of Systems  Constitutional:  Positive for chills. Negative for fever.  Genitourinary:  Positive for hematuria. Negative for frequency.    Physical Exam Updated Vital Signs BP (!) 186/75   Pulse 69   Temp 98.3  F (36.8 C) (Oral)   Resp 17   Ht 6' (1.829 m)   Wt 63.5 kg   LMP 09/23/2015   SpO2 97%   BMI 18.99 kg/m  Physical Exam Vitals and nursing note reviewed.  Constitutional:      General: She is not in acute distress.    Appearance: She is well-developed.  HENT:     Head: Normocephalic and atraumatic.  Eyes:     Conjunctiva/sclera: Conjunctivae normal.  Cardiovascular:     Rate and Rhythm: Normal rate and regular rhythm.     Heart sounds: No murmur heard. Pulmonary:     Effort: Pulmonary effort is normal. No respiratory distress.     Breath sounds: Normal breath sounds.  Abdominal:     Palpations: Abdomen is soft.     Tenderness: There is abdominal tenderness in the right lower quadrant. There is right CVA tenderness. There is no guarding or rebound.  Musculoskeletal:        General: No swelling.     Cervical back: Neck supple.  Skin:    General: Skin is warm and dry.     Capillary Refill: Capillary refill takes less than 2 seconds.  Neurological:     Mental Status: She is alert.  Psychiatric:        Mood and Affect: Mood normal.     ED Results / Procedures / Treatments   Labs (  all labs ordered are listed, but only abnormal results are displayed) Labs Reviewed  COMPREHENSIVE METABOLIC PANEL - Abnormal; Notable for the following components:      Result Value   Glucose, Bld 114 (*)    BUN 34 (*)    Creatinine, Ser 1.13 (*)    Total Protein 8.2 (*)    Albumin 3.1 (*)    AST 78 (*)    ALT 93 (*)    Alkaline Phosphatase 182 (*)    Total Bilirubin 1.9 (*)    GFR, Estimated 56 (*)    All other components within normal limits  CBC WITH DIFFERENTIAL/PLATELET - Abnormal; Notable for the following components:   Hemoglobin 11.6 (*)    MCV 75.6 (*)    MCH 23.6 (*)    Monocytes Absolute 1.9 (*)    Abs Immature Granulocytes 0.31 (*)    All other components within normal limits  URINALYSIS, ROUTINE W REFLEX MICROSCOPIC - Abnormal; Notable for the following components:    Color, Urine AMBER (*)    APPearance HAZY (*)    Specific Gravity, Urine 1.032 (*)    Hgb urine dipstick MODERATE (*)    Protein, ur 30 (*)    Leukocytes,Ua LARGE (*)    Bacteria, UA MANY (*)    All other components within normal limits  CULTURE, BLOOD (ROUTINE X 2)  CULTURE, BLOOD (ROUTINE X 2)  URINE CULTURE    EKG None  Radiology US Abdomen Limited RUQ (LIVER/GB)  Result Date: 07/12/2022 CLINICAL DATA:  Elevated LFTs EXAM: ULTRASOUND ABDOMEN LIMITED RIGHT UPPER QUADRANT COMPARISON:  CT today FINDINGS: Gallbladder: No gallstones or wall thickening visualized. No sonographic Murphy sign noted by sonographer. Common bile duct: Diameter: Normal caliber, 2 mm Liver: No focal lesion identified. Within normal limits in parenchymal echogenicity. Portal vein is patent on color Doppler imaging with normal direction of blood flow towards the liver. Other: None. IMPRESSION: Unremarkable study Electronically Signed   By: Rolm Baptise M.D.   On: 07/12/2022 19:19   CT ABDOMEN PELVIS W CONTRAST  Result Date: 07/12/2022 CLINICAL DATA:  Hematuria EXAM: CT ABDOMEN AND PELVIS WITH CONTRAST TECHNIQUE: Multidetector CT imaging of the abdomen and pelvis was performed using the standard protocol following bolus administration of intravenous contrast. RADIATION DOSE REDUCTION: This exam was performed according to the departmental dose-optimization program which includes automated exposure control, adjustment of the mA and/or kV according to patient size and/or use of iterative reconstruction technique. CONTRAST:  100 cc Omnipaque 300 intravenous COMPARISON:  CT 10/18/2019 FINDINGS: Lower chest: Lung bases demonstrate no acute airspace disease. Borderline to mild cardiomegaly with trace pericardial effusion. Hepatobiliary: No focal liver abnormality is seen. No gallstones, gallbladder wall thickening, or biliary dilatation. Pancreas: Unremarkable. No pancreatic ductal dilatation or surrounding inflammatory changes.  Spleen: Subcentimeter hypodensities in the spleen too Emelda Kohlbeck to further characterize but probably benign Adrenals/Urinary Tract: Adrenal glands are within normal limits. 3.6 cm cyst midpole right kidney, no imaging follow-up is recommended. Several punctate intrarenal stones on the left. Mild left renal pelvis dilatation with slight urothelial enhancement on delayed views. Minimal right hydronephrosis and slightly prominent right ureter, secondary to at least 3 stacked stones in the distal right ureter ovary 2.5 cm craniocaudal length. The largest stone measures 10 mm. There is right perinephric fat stranding. Delayed views demonstrate areas of heterogenous nephrogram on the right suspicious for pyelonephritis. Thick-walled appearance of right renal pelvis. Tirza Senteno focus of air in the bladder, correlate for recent instrumentation. No significant excretion of  contrast on delayed views consistent with decreased renal function. Stomach/Bowel: The stomach is nonenlarged. No dilated Mattilynn Forrer bowel. No acute bowel wall thickening. Vascular/Lymphatic: Moderate aortic atherosclerosis. No aneurysm. No suspicious lymph nodes. Reproductive: Uterus unremarkable.  No suspicious adnexal mass. Other: Clips or coils in the right pelvis. No free air. No significant free fluid. Ellorie Kindall fat containing right periumbilical hernia. Musculoskeletal: No acute or suspicious osseous abnormality IMPRESSION: 1. Minimal right hydronephrosis and hydroureter, secondary to at least 3 stacked stones in the distal right ureter measuring up to 2.5 cm in craniocaudal length. The largest stone measures 10 mm. This is seen several cm proximal to the right UVJ. There is right perinephric fat stranding. Heterogeneous nephrogram of the right kidney on delayed views is suspect for pyelonephritis. Urothelial thickening of right renal pelvis and mild enhancement of left renal pelvis could be inflammatory or due to upper urinary tract infection. 2. Poor excretion of  contrast from both kidneys on delayed views suggesting decreased renal function, correlate with appropriate laboratory values 3. Multiple punctate left intrarenal stones. Clessie Karras focus of air in the bladder, correlate for recent instrumentation. 4. Aortic atherosclerosis. Aortic Atherosclerosis (ICD10-I70.0). Electronically Signed   By: Donavan Foil M.D.   On: 07/12/2022 19:10    Procedures Procedures    Medications Ordered in ED Medications  iohexol (OMNIPAQUE) 300 MG/ML solution 100 mL (100 mLs Intravenous Incomplete 07/12/22 1754)  ketorolac (TORADOL) 15 MG/ML injection 15 mg (15 mg Intravenous Given 07/12/22 1714)  sodium chloride 0.9 % bolus 2,000 mL (0 mLs Intravenous Stopped 07/12/22 2015)  ondansetron (ZOFRAN) injection 4 mg (4 mg Intravenous Given 07/12/22 1714)  iohexol (OMNIPAQUE) 300 MG/ML solution 100 mL (100 mLs Intravenous Contrast Given 07/12/22 1838)  cefTRIAXone (ROCEPHIN) 1 g in sodium chloride 0.9 % 100 mL IVPB (0 g Intravenous Stopped 07/12/22 2002)  HYDROmorphone (DILAUDID) injection 0.5 mg (0.5 mg Intravenous Given 07/12/22 1941)    ED Course/ Medical Decision Making/ A&P                             Medical Decision Making Patient is a 62 year old female, here for some right lower quadrant pain/flank pain that is been going on for the last 24 hours.  She states that she also has been vomiting and having nausea.  Endorses some chills, but no fevers.  History of kidney stones and recurrent UTIs.  We will obtain a CT scan, as well as CBC, CMP urinalysis for further evaluation I am concerned for possible stone versus pyelonephritis, it is unclear at this time.  Will start on IV fluids.  Amount and/or Complexity of Data Reviewed Labs: ordered.    Details: No leukocytosis, elevation of LFTs, bilirubin, urine concerning for UTI with many bacteria and leuks Radiology: ordered.    Details: CT abdomen pelvis, shows concerning findings for pyelonephritis, and stacking stones with the  largest stone being 1 cm Discussion of management or test interpretation with external provider(s): Patient is a 62 year old female, history of recurrent UTIs, nephrolithiasis, here for right lower quadrant pain, radiating to the flank/back.  CVA tenderness.  Found to have a pyelonephritis, with stacking stones.  Given 1 g of Rocephin, and blood cultures obtained.  Spoke with urology, Dr. Gloriann Loan, he recommends admission to hospitalist, and n.p.o. at midnight for surgery in AM.  Spoke with Dr. Bridgett Larsson, he accepts admission.  Admission necessary given infection, uncontrolled pain, and large multiple stones, with hydronephrosis.  Unlikely to pass on  her own.  Risk Prescription drug management. Decision regarding hospitalization.   Final Clinical Impression(s) / ED Diagnoses Final diagnoses:  Pyelonephritis  Hydronephrosis concurrent with and due to calculi of kidney and ureter    Rx / DC Orders ED Discharge Orders     None         Daytona Retana, Si Gaul, PA 07/12/22 2024    Lajean Saver, MD 07/12/22 2145

## 2022-07-12 NOTE — ED Triage Notes (Signed)
Patient arrives in wheelchair by POV c/o lower back pain radiating around to lower abdomen and hematuria onset of yesterday. Patient concerned for UTI.

## 2022-07-12 NOTE — Assessment & Plan Note (Addendum)
Observation med/surg bed. Continue with IVF. Prn toradol, prn dilaudid for pain. EDP has consulted urology(Dr. Gloriann Loan) for cysto/JJ Stent in the AM. Npo after MN.

## 2022-07-12 NOTE — Assessment & Plan Note (Addendum)
Send urine culture. Continue with IV Rocephin.

## 2022-07-12 NOTE — H&P (Signed)
History and Physical    Denise Hudson [id number] DOB: 11-Feb-1961 DOA: 07/12/2022  DOS: the patient was seen and examined on 07/12/2022  PCP: Patient, No Pcp Per   Patient coming from: Home  I have personally briefly reviewed patient's old medical records in [place]  CC: right flank pain HPI: 62 year old African-American female without any significant comorbidities except for history of kidney stones presents to the ER with 2-day history of right flank pain, hematuria, nausea and vomiting.  Patient had kidney stones back in 2018 and 2008 requiring cystoscopy and lithotripsy.  Came to ER today due to worsening pain, nausea and vomiting.  On arrival temp 98.3 heart rate 94 blood pressure 162/79 satting 99% on room air  White count 10.5, hemoglobin 0.6, platelets of 279  Sodium 135, potassium 3.6, bicarbonate 23, BUN of 34, creatinine 1.1  AST 78 ALT of 93 alk phos of 182 total bili 1.9  UA showed large excite esterase greater than 50 white cells many bacteria.  CT abdomen pelvis demonstrated several ureteral stones on the right.  Largest measuring 10 mm.  There is right perinephric fat stranding.  Right upper quadrant ultrasound was negative.  EDP contacted urology (Dr. Gloriann Loan).  Plan is to keep the patient NPO.  Cystoscopy with double-J stent placement in the morning.  [facility] contacted for admission.   ED Course: CT abd shows right pyelonephritis and right ureteral stones  Review of Systems:  Review of Systems  Constitutional: Negative.   HENT: Negative.    Eyes: Negative.   Respiratory: Negative.    Cardiovascular: Negative.   Gastrointestinal:  Positive for nausea and vomiting.  Genitourinary:  Positive for flank pain and hematuria.  Skin: Negative.   Neurological: Negative.   Endo/Heme/Allergies: Negative.   Psychiatric/Behavioral: Negative.    All other systems reviewed and are negative.   Past Medical History:  Diagnosis Date   History of  kidney stones    Nephrolithiasis    left non-obstructive per CT 02-10-2017   Right ureteral stone    Wears contact lenses     Past Surgical History:  Procedure Laterality Date   APPENDECTOMY  age 29   CYSTOSCOPY WITH STENT PLACEMENT Right 02/10/2017   Procedure: CYSTOSCOPY WITH RIGHT STENT PLACEMENT;  Surgeon: Ceasar Mons, MD;  Location: [facility];  Service: Urology;  Laterality: Right;   CYSTOSCOPY/URETEROSCOPY/HOLMIUM LASER/STENT PLACEMENT Right 03/04/2017   Procedure: CYSTOSCOPY/URETEROSCOPY/HOLMIUM LASER/STENT PLACEMENT;  Surgeon: Ceasar Mons, MD;  Location: [facility];  Service: Urology;  Laterality: Right;  NEEDS 30 MIN FOR PROCEDURE   EXTRACORPOREAL SHOCK WAVE LITHOTRIPSY  2008     reports that she quit smoking about 8 years ago. Her smoking use included cigarettes. She has never used smokeless tobacco. She reports that she does not drink alcohol and does not use drugs.  No Known Allergies  History reviewed. No pertinent family history.  Prior to Admission medications   Medication Sig Start Date End Date Taking? Authorizing Provider  Aspirin-Salicylamide-Caffeine (BC HEADACHE POWDER PO) Take 1 packet by mouth daily as needed (pain).     [provider]  ondansetron (ZOFRAN ODT) 4 MG disintegrating tablet Take 1 tablet (4 mg total) by mouth every 8 (eight) hours as needed. 10/18/19   Isla Pence, MD  oxyCODONE-acetaminophen (PERCOCET/ROXICET) 5-325 MG tablet Take 1 tablet by mouth every 4 (four) hours as needed for severe pain. 10/18/19   Isla Pence, MD  tamsulosin (FLOMAX) 0.4 MG CAPS capsule Take 1 capsule (0.4  mg total) by mouth daily. 10/18/19   Isla Pence, MD    Physical Exam: Vitals:   07/12/22 1641 07/12/22 1642 07/12/22 1945  BP:  (!) 162/79 (!) 186/75  Pulse:  94 69  Resp:  16 17  Temp:  98.3 F (36.8 C)   TempSrc:  Oral   SpO2:  99% 97%  Weight: 63.5 kg    Height: 6' (1.829 m)      Physical  Exam Vitals and nursing note reviewed.  Constitutional:      General: She is not in acute distress.    Appearance: Normal appearance. She is normal weight. She is not ill-appearing, toxic-appearing or diaphoretic.  HENT:     Head: Normocephalic and atraumatic.     Nose: Nose normal.  Eyes:     General: No scleral icterus. Cardiovascular:     Rate and Rhythm: Normal rate and regular rhythm.     Pulses: Normal pulses.  Pulmonary:     Effort: Pulmonary effort is normal.     Breath sounds: Normal breath sounds.  Abdominal:     General: Abdomen is flat. Bowel sounds are normal. There is no distension.     Palpations: Abdomen is soft.     Tenderness: There is no abdominal tenderness. There is right CVA tenderness.  Musculoskeletal:     Right lower leg: No edema.     Left lower leg: No edema.  Skin:    General: Skin is warm and dry.     Capillary Refill: Capillary refill takes less than 2 seconds.  Neurological:     General: No focal deficit present.     Mental Status: She is alert and oriented to person, place, and time.      Labs on Admission: I have personally reviewed following labs and imaging studies  CBC: Recent Labs  Lab 07/12/22 1710  WBC 10.5  NEUTROABS 6.8  HGB 11.6*  HCT 37.2  MCV 75.6*  PLT 123XX123   Basic Metabolic Panel: Recent Labs  Lab 07/12/22 1710  NA 135  K 3.6  CL 99  CO2 23  GLUCOSE 114*  BUN 34*  CREATININE 1.13*  CALCIUM 9.0   GFR: Estimated Creatinine Clearance: 53.1 mL/min (A) (by C-G formula based on SCr of 1.13 mg/dL (H)). Liver Function Tests: Recent Labs  Lab 07/12/22 1710  AST 78*  ALT 93*  ALKPHOS 182*  BILITOT 1.9*  PROT 8.2*  ALBUMIN 3.1*   No results for input(s): "LIPASE", "AMYLASE" in the last 168 hours. No results for input(s): "AMMONIA" in the last 168 hours. Coagulation Profile: No results for input(s): "INR", "PROTIME" in the last 168 hours. Cardiac Enzymes: No results for input(s): "CKTOTAL", "CKMB",  "CKMBINDEX", "TROPONINI", "TROPONINIHS" in the last 168 hours. BNP (last 3 results) No results for input(s): "BNP" in the last 8760 hours. HbA1C: No results for input(s): "HGBA1C" in the last 72 hours. CBG: No results for input(s): "GLUCAP" in the last 168 hours. Lipid Profile: No results for input(s): "CHOL", "HDL", "LDLCALC", "TRIG", "CHOLHDL", "LDLDIRECT" in the last 72 hours. Thyroid Function Tests: No results for input(s): "TSH", "T4TOTAL", "FREET4", "T3FREE", "THYROIDAB" in the last 72 hours. Anemia Panel: No results for input(s): "VITAMINB12", "FOLATE", "FERRITIN", "TIBC", "IRON", "RETICCTPCT" in the last 72 hours. Urine analysis:    Component Value Date/Time   COLORURINE AMBER (A) 07/12/2022 1830   APPEARANCEUR HAZY (A) 07/12/2022 1830   LABSPEC 1.032 (H) 07/12/2022 1830   PHURINE 5.0 07/12/2022 1830   GLUCOSEU NEGATIVE 07/12/2022 1830  HGBUR MODERATE (A) 07/12/2022 1830   BILIRUBINUR NEGATIVE 07/12/2022 1830   KETONESUR NEGATIVE 07/12/2022 1830   PROTEINUR 30 (A) 07/12/2022 1830   UROBILINOGEN 1.0 01/02/2015 0043   NITRITE NEGATIVE 07/12/2022 1830   LEUKOCYTESUR LARGE (A) 07/12/2022 1830    Radiological Exams on Admission: I have personally reviewed images US Abdomen Limited RUQ (LIVER/GB)  Result Date: 07/12/2022 CLINICAL DATA:  Elevated LFTs EXAM: ULTRASOUND ABDOMEN LIMITED RIGHT UPPER QUADRANT COMPARISON:  CT today FINDINGS: Gallbladder: No gallstones or wall thickening visualized. No sonographic Murphy sign noted by sonographer. Common bile duct: Diameter: Normal caliber, 2 mm Liver: No focal lesion identified. Within normal limits in parenchymal echogenicity. Portal vein is patent on color Doppler imaging with normal direction of blood flow towards the liver. Other: None. IMPRESSION: Unremarkable study Electronically Signed   By: Rolm Baptise M.D.   On: 07/12/2022 19:19   CT ABDOMEN PELVIS W CONTRAST  Result Date: 07/12/2022 CLINICAL DATA:  Hematuria EXAM: CT  ABDOMEN AND PELVIS WITH CONTRAST TECHNIQUE: Multidetector CT imaging of the abdomen and pelvis was performed using the standard protocol following bolus administration of intravenous contrast. RADIATION DOSE REDUCTION: This exam was performed according to the departmental dose-optimization program which includes automated exposure control, adjustment of the mA and/or kV according to patient size and/or use of iterative reconstruction technique. CONTRAST:  100 cc Omnipaque 300 intravenous COMPARISON:  CT 10/18/2019 FINDINGS: Lower chest: Lung bases demonstrate no acute airspace disease. Borderline to mild cardiomegaly with trace pericardial effusion. Hepatobiliary: No focal liver abnormality is seen. No gallstones, gallbladder wall thickening, or biliary dilatation. Pancreas: Unremarkable. No pancreatic ductal dilatation or surrounding inflammatory changes. Spleen: Subcentimeter hypodensities in the spleen too small to further characterize but probably benign Adrenals/Urinary Tract: Adrenal glands are within normal limits. 3.6 cm cyst midpole right kidney, no imaging follow-up is recommended. Several punctate intrarenal stones on the left. Mild left renal pelvis dilatation with slight urothelial enhancement on delayed views. Minimal right hydronephrosis and slightly prominent right ureter, secondary to at least 3 stacked stones in the distal right ureter ovary 2.5 cm craniocaudal length. The largest stone measures 10 mm. There is right perinephric fat stranding. Delayed views demonstrate areas of heterogenous nephrogram on the right suspicious for pyelonephritis. Thick-walled appearance of right renal pelvis. Small focus of air in the bladder, correlate for recent instrumentation. No significant excretion of contrast on delayed views consistent with decreased renal function. Stomach/Bowel: The stomach is nonenlarged. No dilated small bowel. No acute bowel wall thickening. Vascular/Lymphatic: Moderate aortic  atherosclerosis. No aneurysm. No suspicious lymph nodes. Reproductive: Uterus unremarkable.  No suspicious adnexal mass. Other: Clips or coils in the right pelvis. No free air. No significant free fluid. Small fat containing right periumbilical hernia. Musculoskeletal: No acute or suspicious osseous abnormality IMPRESSION: 1. Minimal right hydronephrosis and hydroureter, secondary to at least 3 stacked stones in the distal right ureter measuring up to 2.5 cm in craniocaudal length. The largest stone measures 10 mm. This is seen several cm proximal to the right UVJ. There is right perinephric fat stranding. Heterogeneous nephrogram of the right kidney on delayed views is suspect for pyelonephritis. Urothelial thickening of right renal pelvis and mild enhancement of left renal pelvis could be inflammatory or due to upper urinary tract infection. 2. Poor excretion of contrast from both kidneys on delayed views suggesting decreased renal function, correlate with appropriate laboratory values 3. Multiple punctate left intrarenal stones. Small focus of air in the bladder, correlate for recent instrumentation. 4. Aortic atherosclerosis.  Aortic Atherosclerosis (ICD10-I70.0). Electronically Signed   By: Donavan Foil M.D.   On: 07/12/2022 19:10    EKG: My personal interpretation of EKG shows: no EKG to review  Assessment/Plan Principal Problem:   Ureterolithiasis Active Problems:   Pyelonephritis of right kidney    Assessment and Plan: * Ureterolithiasis Observation med/surg bed. Continue with IVF. Prn toradol, prn dilaudid for pain. EDP has consulted urology(Dr. Gloriann Loan) for cysto/JJ Stent in the AM. Npo after MN.  Pyelonephritis of right kidney Send urine culture. Continue with IV Rocephin.   DVT prophylaxis: SCDs Code Status: Full Code Family Communication: no family at bedside  Disposition Plan: return home  Consults called: EDP has consulted urology(Dr. Gloriann Loan)  Admission status: Observation,  Med-Surg   Kristopher Oppenheim, DO [facility] 07/12/2022, 8:35 PM

## 2022-07-12 NOTE — ED Notes (Signed)
ED TO INPATIENT HANDOFF REPORT  ED Nurse Name and Phone #: Janett Billow RN  S Name/Age/Gender Denise Hudson 62 y.o. female Room/Bed: WA03/WA03  Code Status   Code Status: Full Code  Home/SNF/Other Home Patient oriented to: self, place, time, and situation Is this baseline? Yes   Triage Complete: Triage complete  Chief Complaint Ureterolithiasis [N20.1]  Triage Note Patient arrives in wheelchair by POV c/o lower back pain radiating around to lower abdomen and hematuria onset of yesterday. Patient concerned for UTI.    Allergies No Known Allergies  Level of Care/Admitting Diagnosis ED Disposition     ED Disposition  Admit   Condition  --   Comment  Hospital Area: [place] [[id number]]  Level of Care: Med-Surg [16]  May place patient in observation at [facility] or [place] if equivalent level of care is available:: No  Covid Evaluation: Asymptomatic - no recent exposure (last 10 days) testing not required  Diagnosis: Ureterolithiasis [id number]  Admitting Physician: Bridgett Larsson [place]  Attending Physician: Bridgett Larsson, ERIC [[id number]]          B Medical/Surgery History Past Medical History:  Diagnosis Date   History of kidney stones    Nephrolithiasis    left non-obstructive per CT 02-10-2017   Right ureteral stone    Wears contact lenses    Past Surgical History:  Procedure Laterality Date   APPENDECTOMY  age 76   CYSTOSCOPY WITH STENT PLACEMENT Right 02/10/2017   Procedure: CYSTOSCOPY WITH RIGHT STENT PLACEMENT;  Surgeon: Ceasar Mons, MD;  Location: [facility];  Service: Urology;  Laterality: Right;   CYSTOSCOPY/URETEROSCOPY/HOLMIUM LASER/STENT PLACEMENT Right 03/04/2017   Procedure: CYSTOSCOPY/URETEROSCOPY/HOLMIUM LASER/STENT PLACEMENT;  Surgeon: Ceasar Mons, MD;  Location: [facility];  Service: Urology;  Laterality: Right;  NEEDS 30 MIN FOR PROCEDURE   EXTRACORPOREAL SHOCK WAVE LITHOTRIPSY  2008     A IV  Location/Drains/Wounds Patient Lines/Drains/Airways Status     Active Line/Drains/Airways     Name Placement date Placement time Site Days   Peripheral IV 07/12/22 20 G Right Antecubital 07/12/22  1713  Antecubital  less than 1   Ureteral Drain/Stent Right ureter 6 Fr. 03/04/17  1150  Right ureter  1956   Incision (Closed) 03/04/17 Perineum 03/04/17  1213  -- 1956   Wound / Incision (Open or Dehisced) 09/23/15 Puncture Finger (Comment which one) Left 09/23/15  2005  Finger (Comment which one)  2484            Intake/Output Last 24 hours  Intake/Output Summary (Last 24 hours) at 07/12/2022 2051 Last data filed at 07/12/2022 2015 Gross per 24 hour  Intake 2100 ml  Output --  Net 2100 ml    Labs/Imaging Results for orders placed or performed during the hospital encounter of 07/12/22 (from the past 48 hour(s))  Comprehensive metabolic panel     Status: Abnormal   Collection Time: 07/12/22  5:10 PM  Result Value Ref Range   Sodium 135 135 - 145 mmol/L   Potassium 3.6 3.5 - 5.1 mmol/L   Chloride 99 98 - 111 mmol/L   CO2 23 22 - 32 mmol/L   Glucose, Bld 114 (H) 70 - 99 mg/dL    Comment: Glucose reference range applies only to samples taken after fasting for at least 8 hours.   BUN 34 (H) 6 - 20 mg/dL   Creatinine, Ser 1.13 (H) 0.44 - 1.00 mg/dL   Calcium 9.0 8.9 - 10.3 mg/dL   Total  Protein 8.2 (H) 6.5 - 8.1 g/dL   Albumin 3.1 (L) 3.5 - 5.0 g/dL   AST 78 (H) 15 - 41 U/L   ALT 93 (H) 0 - 44 U/L   Alkaline Phosphatase 182 (H) 38 - 126 U/L   Total Bilirubin 1.9 (H) 0.3 - 1.2 mg/dL   GFR, Estimated 56 (L) >60 mL/min    Comment: (NOTE) Calculated using the CKD-EPI Creatinine Equation (2021)    Anion gap 13 5 - 15    Comment: Performed at [facility], [place] [address], [place], [place] [address]  CBC with Differential     Status: Abnormal   Collection Time: 07/12/22  5:10 PM  Result Value Ref Range   WBC 10.5 4.0 - 10.5 K/uL   RBC 4.92 3.87 - 5.11 MIL/uL    Hemoglobin 11.6 (L) 12.0 - 15.0 g/dL   HCT 37.2 36.0 - 46.0 %   MCV 75.6 (L) 80.0 - 100.0 fL   MCH 23.6 (L) 26.0 - 34.0 pg   MCHC 31.2 30.0 - 36.0 g/dL   RDW 14.0 11.5 - 15.5 %   Platelets 279 150 - 400 K/uL   nRBC 0.0 0.0 - 0.2 %   Neutrophils Relative % 64 %   Neutro Abs 6.8 1.7 - 7.7 K/uL   Lymphocytes Relative 14 %   Lymphs Abs 1.5 0.7 - 4.0 K/uL   Monocytes Relative 18 %   Monocytes Absolute 1.9 (H) 0.1 - 1.0 K/uL   Eosinophils Relative 0 %   Eosinophils Absolute 0.0 0.0 - 0.5 K/uL   Basophils Relative 1 %   Basophils Absolute 0.1 0.0 - 0.1 K/uL   Immature Granulocytes 3 %   Abs Immature Granulocytes 0.31 (H) 0.00 - 0.07 K/uL    Comment: Performed at [facility], [place] [address], [place], [place] [address]  Urinalysis, Routine w reflex microscopic -Urine, Clean Catch     Status: Abnormal   Collection Time: 07/12/22  6:30 PM  Result Value Ref Range   Color, Urine AMBER (A) YELLOW    Comment: BIOCHEMICALS MAY BE AFFECTED BY COLOR   APPearance HAZY (A) CLEAR   Specific Gravity, Urine 1.032 (H) 1.005 - 1.030   pH 5.0 5.0 - 8.0   Glucose, UA NEGATIVE NEGATIVE mg/dL   Hgb urine dipstick MODERATE (A) NEGATIVE   Bilirubin Urine NEGATIVE NEGATIVE   Ketones, ur NEGATIVE NEGATIVE mg/dL   Protein, ur 30 (A) NEGATIVE mg/dL   Nitrite NEGATIVE NEGATIVE   Leukocytes,Ua LARGE (A) NEGATIVE   RBC / HPF 21-50 0 - 5 RBC/hpf   WBC, UA >50 0 - 5 WBC/hpf   Bacteria, UA MANY (A) NONE SEEN   Squamous Epithelial / HPF 6-10 0 - 5 /HPF   Mucus PRESENT    Hyaline Casts, UA PRESENT     Comment: Performed at [facility], [place] [address], [place], [place] [address]   US Abdomen Limited RUQ (LIVER/GB)  Result Date: 07/12/2022 CLINICAL DATA:  Elevated LFTs EXAM: ULTRASOUND ABDOMEN LIMITED RIGHT UPPER QUADRANT COMPARISON:  CT today FINDINGS: Gallbladder: No gallstones or wall thickening visualized. No sonographic Murphy sign noted by sonographer. Common bile duct:  Diameter: Normal caliber, 2 mm Liver: No focal lesion identified. Within normal limits in parenchymal echogenicity. Portal vein is patent on color Doppler imaging with normal direction of blood flow towards the liver. Other: None. IMPRESSION: Unremarkable study Electronically Signed   By: Rolm Baptise M.D.   On: 07/12/2022 19:19  CT ABDOMEN PELVIS W CONTRAST  Result Date: 07/12/2022 CLINICAL DATA:  Hematuria EXAM: CT ABDOMEN AND PELVIS WITH CONTRAST TECHNIQUE: Multidetector CT imaging of the abdomen and pelvis was performed using the standard protocol following bolus administration of intravenous contrast. RADIATION DOSE REDUCTION: This exam was performed according to the departmental dose-optimization program which includes automated exposure control, adjustment of the mA and/or kV according to patient size and/or use of iterative reconstruction technique. CONTRAST:  100 cc Omnipaque 300 intravenous COMPARISON:  CT 10/18/2019 FINDINGS: Lower chest: Lung bases demonstrate no acute airspace disease. Borderline to mild cardiomegaly with trace pericardial effusion. Hepatobiliary: No focal liver abnormality is seen. No gallstones, gallbladder wall thickening, or biliary dilatation. Pancreas: Unremarkable. No pancreatic ductal dilatation or surrounding inflammatory changes. Spleen: Subcentimeter hypodensities in the spleen too small to further characterize but probably benign Adrenals/Urinary Tract: Adrenal glands are within normal limits. 3.6 cm cyst midpole right kidney, no imaging follow-up is recommended. Several punctate intrarenal stones on the left. Mild left renal pelvis dilatation with slight urothelial enhancement on delayed views. Minimal right hydronephrosis and slightly prominent right ureter, secondary to at least 3 stacked stones in the distal right ureter ovary 2.5 cm craniocaudal length. The largest stone measures 10 mm. There is right perinephric fat stranding. Delayed views demonstrate areas of  heterogenous nephrogram on the right suspicious for pyelonephritis. Thick-walled appearance of right renal pelvis. Small focus of air in the bladder, correlate for recent instrumentation. No significant excretion of contrast on delayed views consistent with decreased renal function. Stomach/Bowel: The stomach is nonenlarged. No dilated small bowel. No acute bowel wall thickening. Vascular/Lymphatic: Moderate aortic atherosclerosis. No aneurysm. No suspicious lymph nodes. Reproductive: Uterus unremarkable.  No suspicious adnexal mass. Other: Clips or coils in the right pelvis. No free air. No significant free fluid. Small fat containing right periumbilical hernia. Musculoskeletal: No acute or suspicious osseous abnormality IMPRESSION: 1. Minimal right hydronephrosis and hydroureter, secondary to at least 3 stacked stones in the distal right ureter measuring up to 2.5 cm in craniocaudal length. The largest stone measures 10 mm. This is seen several cm proximal to the right UVJ. There is right perinephric fat stranding. Heterogeneous nephrogram of the right kidney on delayed views is suspect for pyelonephritis. Urothelial thickening of right renal pelvis and mild enhancement of left renal pelvis could be inflammatory or due to upper urinary tract infection. 2. Poor excretion of contrast from both kidneys on delayed views suggesting decreased renal function, correlate with appropriate laboratory values 3. Multiple punctate left intrarenal stones. Small focus of air in the bladder, correlate for recent instrumentation. 4. Aortic atherosclerosis. Aortic Atherosclerosis (ICD10-I70.0). Electronically Signed   By: Donavan Foil M.D.   On: 07/12/2022 19:10    Pending Labs Unresulted Labs (From admission, onward)     Start     Ordered   07/12/22 2021  Urine Culture (for pregnant, neutropenic or urologic patients or patients with an indwelling urinary catheter)  (Urine Labs)  Add-on,   AD       Question:  Indication   Answer:  Flank Pain   07/12/22 2020   07/12/22 2000  Blood culture (routine x 2)  BLOOD CULTURE X 2,   R (with STAT occurrences)      07/12/22 1959   Signed and Held  HIV Antibody (routine testing w rflx)  (HIV Antibody (Routine testing w reflex) panel)  Add-on,   R        Signed and Held   Signed and Held  Comprehensive metabolic panel  Tomorrow morning,   R        Signed and Held   Signed and Held  CBC with Differential/Platelet  Tomorrow morning,   R        Signed and Held   Signed and Held  Magnesium  Tomorrow morning,   R        Signed and Held            Vitals/Pain Today's Vitals   07/12/22 1931 07/12/22 1945 07/12/22 2010 07/12/22 2030  BP:  (!) 186/75  (!) 161/78  Pulse:  69  64  Resp:  17  18  Temp:      TempSrc:      SpO2:  97%  96%  Weight:      Height:      PainSc: 10-Worst pain ever  2      Isolation Precautions No active isolations  Medications Medications  iohexol (OMNIPAQUE) 300 MG/ML solution 100 mL (100 mLs Intravenous Incomplete 07/12/22 1754)  ketorolac (TORADOL) 15 MG/ML injection 15 mg (15 mg Intravenous Given 07/12/22 1714)  sodium chloride 0.9 % bolus 2,000 mL (0 mLs Intravenous Stopped 07/12/22 2015)  ondansetron (ZOFRAN) injection 4 mg (4 mg Intravenous Given 07/12/22 1714)  iohexol (OMNIPAQUE) 300 MG/ML solution 100 mL (100 mLs Intravenous Contrast Given 07/12/22 1838)  cefTRIAXone (ROCEPHIN) 1 g in sodium chloride 0.9 % 100 mL IVPB (0 g Intravenous Stopped 07/12/22 2002)  HYDROmorphone (DILAUDID) injection 0.5 mg (0.5 mg Intravenous Given 07/12/22 1941)    Mobility walks     Focused Assessments Renal Assessment Handoff:  Multiple kidney stones, hx of previous stone and stent Pain controlled with dilaudid one time dose.   R Recommendations: See Admitting Provider Note  Report given to:   Additional Notes:

## 2022-07-12 NOTE — Subjective & Objective (Signed)
CC: right flank pain HPI: 62 year old African-American female without any significant comorbidities except for history of kidney stones presents to the ER with 2-day history of right flank pain, hematuria, nausea and vomiting.  Patient had kidney stones back in 2018 and 2008 requiring cystoscopy and lithotripsy.  Came to ER today due to worsening pain, nausea and vomiting.  On arrival temp 98.3 heart rate 94 blood pressure 162/79 satting 99% on room air  White count 10.5, hemoglobin 0.6, platelets of 279  Sodium 135, potassium 3.6, bicarbonate 23, BUN of 34, creatinine 1.1  AST 78 ALT of 93 alk phos of 182 total bili 1.9  UA showed large excite esterase greater than 50 white cells many bacteria.  CT abdomen pelvis demonstrated several ureteral stones on the right.  Largest measuring 10 mm.  There is right perinephric fat stranding.  Right upper quadrant ultrasound was negative.  EDP contacted urology (Dr. Gloriann Loan).  Plan is to keep the patient NPO.  Cystoscopy with double-J stent placement in the morning.  [facility] contacted for admission.

## 2022-07-13 ENCOUNTER — Observation Stay (HOSPITAL_COMMUNITY): Payer: BC Managed Care – PPO | Admitting: Anesthesiology

## 2022-07-13 ENCOUNTER — Encounter (HOSPITAL_COMMUNITY)
Admission: EM | Disposition: A | Discharge status: Home / Self Care | Payer: Self-pay | Source: Home / Self Care | Attending: Family Medicine

## 2022-07-13 ENCOUNTER — Observation Stay (HOSPITAL_COMMUNITY): Payer: BC Managed Care – PPO

## 2022-07-13 DIAGNOSIS — N179 Acute kidney failure, unspecified: Secondary | ICD-10-CM | POA: Diagnosis not present

## 2022-07-13 DIAGNOSIS — B9620 Unspecified Escherichia coli [E. coli] as the cause of diseases classified elsewhere: Secondary | ICD-10-CM | POA: Diagnosis not present

## 2022-07-13 DIAGNOSIS — N201 Calculus of ureter: Secondary | ICD-10-CM | POA: Diagnosis not present

## 2022-07-13 DIAGNOSIS — N390 Urinary tract infection, site not specified: Secondary | ICD-10-CM | POA: Diagnosis not present

## 2022-07-13 DIAGNOSIS — N12 Tubulo-interstitial nephritis, not specified as acute or chronic: Secondary | ICD-10-CM | POA: Diagnosis not present

## 2022-07-13 HISTORY — PX: CYSTOSCOPY/URETEROSCOPY/HOLMIUM LASER/STENT PLACEMENT: SHX6546

## 2022-07-13 LAB — HIV ANTIBODY (ROUTINE TESTING W REFLEX): HIV Screen 4th Generation wRfx: NONREACTIVE

## 2022-07-13 LAB — CBC WITH DIFFERENTIAL/PLATELET
Abs Immature Granulocytes: 0.1 10*3/uL — ABNORMAL HIGH (ref 0.00–0.07)
Basophils Absolute: 0.1 10*3/uL (ref 0.0–0.1)
Basophils Relative: 1 %
Eosinophils Absolute: 0 10*3/uL (ref 0.0–0.5)
Eosinophils Relative: 0 %
HCT: 35.8 % — ABNORMAL LOW (ref 36.0–46.0)
Hemoglobin: 10.9 g/dL — ABNORMAL LOW (ref 12.0–15.0)
Immature Granulocytes: 1 %
Lymphocytes Relative: 8 %
Lymphs Abs: 0.8 10*3/uL (ref 0.7–4.0)
MCH: 23.6 pg — ABNORMAL LOW (ref 26.0–34.0)
MCHC: 30.4 g/dL (ref 30.0–36.0)
MCV: 77.5 fL — ABNORMAL LOW (ref 80.0–100.0)
Monocytes Absolute: 1.2 10*3/uL — ABNORMAL HIGH (ref 0.1–1.0)
Monocytes Relative: 11 %
Neutro Abs: 8.4 10*3/uL — ABNORMAL HIGH (ref 1.7–7.7)
Neutrophils Relative %: 79 %
Platelets: 268 10*3/uL (ref 150–400)
RBC: 4.62 MIL/uL (ref 3.87–5.11)
RDW: 14.6 % (ref 11.5–15.5)
WBC: 10.5 10*3/uL (ref 4.0–10.5)
nRBC: 0 % (ref 0.0–0.2)

## 2022-07-13 LAB — COMPREHENSIVE METABOLIC PANEL
ALT: 73 U/L — ABNORMAL HIGH (ref 0–44)
AST: 57 U/L — ABNORMAL HIGH (ref 15–41)
Albumin: 2.7 g/dL — ABNORMAL LOW (ref 3.5–5.0)
Alkaline Phosphatase: 154 U/L — ABNORMAL HIGH (ref 38–126)
Anion gap: 9 (ref 5–15)
BUN: 28 mg/dL — ABNORMAL HIGH (ref 6–20)
CO2: 25 mmol/L (ref 22–32)
Calcium: 8.6 mg/dL — ABNORMAL LOW (ref 8.9–10.3)
Chloride: 104 mmol/L (ref 98–111)
Creatinine, Ser: 0.99 mg/dL (ref 0.44–1.00)
GFR, Estimated: >60 mL/min (ref >60)
Glucose, Bld: 108 mg/dL — ABNORMAL HIGH (ref 70–99)
Potassium: 4.2 mmol/L (ref 3.5–5.1)
Sodium: 138 mmol/L (ref 135–145)
Total Bilirubin: 1.2 mg/dL (ref 0.3–1.2)
Total Protein: 7 g/dL (ref 6.5–8.1)

## 2022-07-13 LAB — MAGNESIUM: Magnesium: 2.1 mg/dL (ref 1.7–2.4)

## 2022-07-13 LAB — SURGICAL PCR SCREEN
MRSA, PCR: NEGATIVE
Staphylococcus aureus: NEGATIVE

## 2022-07-13 SURGERY — CYSTOSCOPY/URETEROSCOPY/HOLMIUM LASER/STENT PLACEMENT
Anesthesia: General | Site: Ureter | Laterality: Right

## 2022-07-13 MED ORDER — FENTANYL CITRATE (PF) 100 MCG/2ML IJ SOLN
INTRAMUSCULAR | Status: DC | PRN
  Administered 2022-07-13: 100 ug via INTRAVENOUS

## 2022-07-13 MED ORDER — PROPOFOL 10 MG/ML IV BOLUS
INTRAVENOUS | Status: AC
  Filled 2022-07-13: qty 20

## 2022-07-13 MED ORDER — MIDAZOLAM HCL 5 MG/5ML IJ SOLN
INTRAMUSCULAR | Status: DC | PRN
  Administered 2022-07-13: 2 mg via INTRAVENOUS

## 2022-07-13 MED ORDER — MIDAZOLAM HCL 2 MG/2ML IJ SOLN
INTRAMUSCULAR | Status: AC
  Filled 2022-07-13: qty 2

## 2022-07-13 MED ORDER — ONDANSETRON HCL 4 MG/2ML IJ SOLN
INTRAMUSCULAR | Status: AC
  Filled 2022-07-13: qty 2

## 2022-07-13 MED ORDER — ARTIFICIAL TEARS OPHTHALMIC OINT
TOPICAL_OINTMENT | OPHTHALMIC | Status: AC
  Filled 2022-07-13: qty 3.5

## 2022-07-13 MED ORDER — DEXAMETHASONE SODIUM PHOSPHATE 10 MG/ML IJ SOLN
INTRAMUSCULAR | Status: AC
  Filled 2022-07-13: qty 1

## 2022-07-13 MED ORDER — LIDOCAINE HCL (PF) 2 % IJ SOLN
INTRAMUSCULAR | Status: AC
  Filled 2022-07-13: qty 5

## 2022-07-13 MED ORDER — PROPOFOL 10 MG/ML IV BOLUS
INTRAVENOUS | Status: DC | PRN
  Administered 2022-07-13: 100 mg via INTRAVENOUS

## 2022-07-13 MED ORDER — IOHEXOL 300 MG/ML  SOLN
INTRAMUSCULAR | Status: DC | PRN
  Administered 2022-07-13: 20 mL

## 2022-07-13 MED ORDER — MUPIROCIN 2 % EX OINT: 1.0000 | TOPICAL_OINTMENT | Freq: Two times a day (BID) | CUTANEOUS | Status: DC

## 2022-07-13 MED ORDER — FENTANYL CITRATE PF 50 MCG/ML IJ SOSY: 25.0000 ug | PREFILLED_SYRINGE | INTRAMUSCULAR | Status: DC | PRN

## 2022-07-13 MED ORDER — PHENYLEPHRINE 80 MCG/ML (10ML) SYRINGE FOR IV PUSH (FOR BLOOD PRESSURE SUPPORT)
PREFILLED_SYRINGE | INTRAVENOUS | Status: DC | PRN
  Administered 2022-07-13: 160 ug via INTRAVENOUS

## 2022-07-13 MED ORDER — DEXAMETHASONE SODIUM PHOSPHATE 10 MG/ML IJ SOLN
INTRAMUSCULAR | Status: DC | PRN
  Administered 2022-07-13: 6 mg via INTRAVENOUS

## 2022-07-13 MED ORDER — ONDANSETRON HCL 4 MG/2ML IJ SOLN
INTRAMUSCULAR | Status: DC | PRN
  Administered 2022-07-13: 4 mg via INTRAVENOUS

## 2022-07-13 MED ORDER — SODIUM CHLORIDE 0.9 % IR SOLN
Status: DC | PRN
  Administered 2022-07-13: 3000 mL via INTRAVESICAL

## 2022-07-13 MED ORDER — FENTANYL CITRATE (PF) 100 MCG/2ML IJ SOLN
INTRAMUSCULAR | Status: AC
  Filled 2022-07-13: qty 2

## 2022-07-13 SURGICAL SUPPLY — 23 items
BAG URO CATCHER STRL LF (MISCELLANEOUS) ×1 IMPLANT
BASKET LASER NITINOL 1.9FR (BASKET) IMPLANT
BASKET ZERO TIP NITINOL 2.4FR (BASKET) IMPLANT
CATH URETERAL DUAL LUMEN 10F (MISCELLANEOUS) IMPLANT
CATH URETL OPEN END 6FR 70 (CATHETERS) ×1 IMPLANT
CLOTH BEACON ORANGE TIMEOUT ST (SAFETY) ×1 IMPLANT
EXTRACTOR STONE 1.7FRX115CM (UROLOGICAL SUPPLIES) IMPLANT
GLOVE BIO SURGEON STRL SZ7.5 (GLOVE) ×1 IMPLANT
GOWN STRL REUS W/ TWL XL LVL3 (GOWN DISPOSABLE) ×1 IMPLANT
GOWN STRL REUS W/TWL XL LVL3 (GOWN DISPOSABLE) ×1
GUIDEWIRE ANG ZIPWIRE 038X150 (WIRE) IMPLANT
GUIDEWIRE STR DUAL SENSOR (WIRE) ×1 IMPLANT
KIT TURNOVER KIT A (KITS) IMPLANT
LASER FIB FLEXIVA PULSE ID 365 (Laser) IMPLANT
MANIFOLD NEPTUNE II (INSTRUMENTS) ×1 IMPLANT
PACK CYSTO (CUSTOM PROCEDURE TRAY) ×1 IMPLANT
SHEATH NAVIGATOR HD 11/13X28 (SHEATH) IMPLANT
SHEATH NAVIGATOR HD 11/13X36 (SHEATH) IMPLANT
STENT URET 6FRX26 CONTOUR (STENTS) IMPLANT
TRACTIP FLEXIVA PULS ID 200XHI (Laser) IMPLANT
TRACTIP FLEXIVA PULSE ID 200 (Laser)
TUBING CONNECTING 10 (TUBING) ×1 IMPLANT
TUBING UROLOGY SET (TUBING) ×1 IMPLANT

## 2022-07-13 NOTE — Transfer of Care (Signed)
Immediate Anesthesia Transfer of Care Note  Patient: Denise Hudson  Procedure(s) Performed: CYSTOSCOPY RIGHT URETERAL STENT PLACEMENT (Right: Ureter)  Patient Location: PACU  Anesthesia Type:General  Level of Consciousness: awake, alert , oriented, and patient cooperative  Airway & Oxygen Therapy: Patient Spontanous Breathing and Patient connected to nasal cannula oxygen  Post-op Assessment: Report given to RN, Post -op Vital signs reviewed and stable, and Patient moving all extremities X 4  Post vital signs: Reviewed and stable  Last Vitals:  Vitals Value Taken Time  BP 136/68 07/13/22 0822  Temp    Pulse 54 07/13/22 0824  Resp 7 07/13/22 0824  SpO2 99 % 07/13/22 0824  Vitals shown include unvalidated device data.  Last Pain:  Vitals:   07/13/22 0651  TempSrc:   PainSc: 0-No pain      Patients Stated Pain Goal: 1 ([id number])  Complications: No notable events documented.

## 2022-07-13 NOTE — Progress Notes (Signed)
Progress Note    Denise Hudson   [id number]  DOB: 1960-12-08  DOA: 07/12/2022     0 PCP: Patient, No Pcp Per  Initial CC: R flank pain, N/V  Hospital Course: Ms. Dabrowski is a 62 yo female with PMH ureterolithiasis, nephrolithiasis who presented with right flank pain, hematuria, nausea/vomiting.  She last had stones in 2018 requiring cystoscopy and lithotripsy. She was found to be febrile in the ER.  CT abdomen/pelvis was performed which showed multiple stones in the distal right ureter with right perinephric stranding. Urinalysis was consistent with UTI and she was started on Rocephin.  Blood cultures were also obtained. Urology was consulted and she underwent cystoscopy with right ureteral stent placement on 07/13/2022.  Interval History:  Seen in her room this morning after returning from cystoscopy.  Her abdominal pain had resolved and she has no nausea/vomiting.  She was still drowsy from sedation but appeared very comfortable.  She had no concerns or complaints.  Assessment and Plan: * Ureterolithiasis - Multiple stones noted on CT abdomen/pelvis involving right ureter with upstream hydroureteronephrosis noted on cystoscopy -Patient underwent right ureteral stent placement with urology on 07/13/2022 -Patient will follow-up with urology outpatient for eventual ureteroscopy  Pyelonephritis of right kidney - UA noted with large LE, negative nitrite, greater than 50 WBC, many bacteria - Continue Rocephin - Follow-up urine and blood cultures -Given right perinephric stranding, will target 10-day course for treatment  AKI (acute kidney injury) ([place]) - baseline creatinine ~ suspected normal - patient presents with increase in creat >0.3 mg/dL above baseline, creat increase >1.5x baseline presumed to have occurred within past 7 days PTA - creat 1.13 on admission but GFR also noted down to ~56. Unclear if underlying CKD but need to trend BMP; unable to view outpatient  labwork    Old records reviewed in assessment of this patient  Antimicrobials: Rocephin 07/12/22 >> current  DVT prophylaxis:  SCDs Start: 07/12/22 2135   Code Status:   Code Status: Full Code  Mobility Assessment (last 72 hours)     Mobility Assessment     Row Name 07/12/22 2145           Does patient have an order for bedrest or is patient medically unstable No - Continue assessment       What is the highest level of mobility based on the progressive mobility assessment? Level 6 (Walks independently in room and hall) - Balance while walking in room without assist - Complete                Barriers to discharge: none Disposition Plan:  Home 1-2 days Status is: Inpatient  Objective: Blood pressure (!) 141/59, pulse (!) 59, temperature 97.9 F (36.6 C), temperature source Oral, resp. rate 16, height 6' (1.829 m), weight 62 kg, last menstrual period 09/23/2015, SpO2 97 %.  Examination:  Physical Exam Constitutional:      General: She is not in acute distress.    Appearance: Normal appearance.  HENT:     Head: Normocephalic and atraumatic.     Mouth/Throat:     Mouth: Mucous membranes are moist.  Eyes:     Extraocular Movements: Extraocular movements intact.  Cardiovascular:     Rate and Rhythm: Regular rhythm. Bradycardia present.  Pulmonary:     Effort: Pulmonary effort is normal. No respiratory distress.     Breath sounds: Normal breath sounds. No wheezing.  Abdominal:     General: Bowel sounds are normal. There is  no distension.     Palpations: Abdomen is soft.     Tenderness: There is no abdominal tenderness.  Musculoskeletal:        General: No swelling. Normal range of motion.     Cervical back: Normal range of motion and neck supple.  Skin:    General: Skin is dry.  Neurological:     General: No focal deficit present.     Mental Status: She is alert.  Psychiatric:        Mood and Affect: Mood normal.        Behavior: Behavior normal.       Consultants:  Urology  Procedures:  07/13/22: Cystoscopy with right ureteral stent placement   Data Reviewed: Results for orders placed or performed during the hospital encounter of 07/12/22 (from the past 24 hour(s))  Comprehensive metabolic panel     Status: Abnormal   Collection Time: 07/12/22  5:10 PM  Result Value Ref Range   Sodium 135 135 - 145 mmol/L   Potassium 3.6 3.5 - 5.1 mmol/L   Chloride 99 98 - 111 mmol/L   CO2 23 22 - 32 mmol/L   Glucose, Bld 114 (H) 70 - 99 mg/dL   BUN 34 (H) 6 - 20 mg/dL   Creatinine, Ser 1.13 (H) 0.44 - 1.00 mg/dL   Calcium 9.0 8.9 - 10.3 mg/dL   Total Protein 8.2 (H) 6.5 - 8.1 g/dL   Albumin 3.1 (L) 3.5 - 5.0 g/dL   AST 78 (H) 15 - 41 U/L   ALT 93 (H) 0 - 44 U/L   Alkaline Phosphatase 182 (H) 38 - 126 U/L   Total Bilirubin 1.9 (H) 0.3 - 1.2 mg/dL   GFR, Estimated 56 (L) >60 mL/min   Anion gap 13 5 - 15  CBC with Differential     Status: Abnormal   Collection Time: 07/12/22  5:10 PM  Result Value Ref Range   WBC 10.5 4.0 - 10.5 K/uL   RBC 4.92 3.87 - 5.11 MIL/uL   Hemoglobin 11.6 (L) 12.0 - 15.0 g/dL   HCT 37.2 36.0 - 46.0 %   MCV 75.6 (L) 80.0 - 100.0 fL   MCH 23.6 (L) 26.0 - 34.0 pg   MCHC 31.2 30.0 - 36.0 g/dL   RDW 14.0 11.5 - 15.5 %   Platelets 279 150 - 400 K/uL   nRBC 0.0 0.0 - 0.2 %   Neutrophils Relative % 64 %   Neutro Abs 6.8 1.7 - 7.7 K/uL   Lymphocytes Relative 14 %   Lymphs Abs 1.5 0.7 - 4.0 K/uL   Monocytes Relative 18 %   Monocytes Absolute 1.9 (H) 0.1 - 1.0 K/uL   Eosinophils Relative 0 %   Eosinophils Absolute 0.0 0.0 - 0.5 K/uL   Basophils Relative 1 %   Basophils Absolute 0.1 0.0 - 0.1 K/uL   Immature Granulocytes 3 %   Abs Immature Granulocytes 0.31 (H) 0.00 - 0.07 K/uL  Urinalysis, Routine w reflex microscopic -Urine, Clean Catch     Status: Abnormal   Collection Time: 07/12/22  6:30 PM  Result Value Ref Range   Color, Urine AMBER (A) YELLOW   APPearance HAZY (A) CLEAR   Specific Gravity, Urine 1.032  (H) 1.005 - 1.030   pH 5.0 5.0 - 8.0   Glucose, UA NEGATIVE NEGATIVE mg/dL   Hgb urine dipstick MODERATE (A) NEGATIVE   Bilirubin Urine NEGATIVE NEGATIVE   Ketones, ur NEGATIVE NEGATIVE mg/dL   Protein, ur 30 (A)  NEGATIVE mg/dL   Nitrite NEGATIVE NEGATIVE   Leukocytes,Ua LARGE (A) NEGATIVE   RBC / HPF 21-50 0 - 5 RBC/hpf   WBC, UA >50 0 - 5 WBC/hpf   Bacteria, UA MANY (A) NONE SEEN   Squamous Epithelial / HPF 6-10 0 - 5 /HPF   Mucus PRESENT    Hyaline Casts, UA PRESENT   Blood culture (routine x 2)     Status: None (Preliminary result)   Collection Time: 07/12/22  9:11 PM   Specimen: BLOOD  Result Value Ref Range   Specimen Description      BLOOD BLOOD RIGHT FOREARM Performed at [facility] [address], [place], [place] [address]    Special Requests      BOTTLES DRAWN AEROBIC AND ANAEROBIC Blood Culture results may not be optimal due to an inadequate volume of blood received in culture bottles Performed at [facility], [place] [address] [place], [place] [address]    Culture      NO GROWTH < 12 HOURS Performed at [facility] [address], [place], [place] [address]    Report Status PENDING   Blood culture (routine x 2)     Status: None (Preliminary result)   Collection Time: 07/12/22  9:25 PM   Specimen: BLOOD  Result Value Ref Range   Specimen Description      BLOOD BLOOD LEFT FOREARM Performed at [facility] [address], [place], [place] [address]    Special Requests      BOTTLES DRAWN AEROBIC AND ANAEROBIC Blood Culture results may not be optimal due to an inadequate volume of blood received in culture bottles Performed at [facility], [place] [address] [place], [place] [address]    Culture      NO GROWTH < 12 HOURS Performed at [facility] [address] [place], [place] [address]    Report Status PENDING   Surgical PCR screen     Status: None   Collection Time:  07/13/22  3:10 AM   Specimen: Nasal Mucosa; Nasal Swab  Result Value Ref Range   MRSA, PCR NEGATIVE NEGATIVE   Staphylococcus aureus NEGATIVE NEGATIVE  Comprehensive metabolic panel     Status: Abnormal   Collection Time: 07/13/22  4:47 AM  Result Value Ref Range   Sodium 138 135 - 145 mmol/L   Potassium 4.2 3.5 - 5.1 mmol/L   Chloride 104 98 - 111 mmol/L   CO2 25 22 - 32 mmol/L   Glucose, Bld 108 (H) 70 - 99 mg/dL   BUN 28 (H) 6 - 20 mg/dL   Creatinine, Ser 0.99 0.44 - 1.00 mg/dL   Calcium 8.6 (L) 8.9 - 10.3 mg/dL   Total Protein 7.0 6.5 - 8.1 g/dL   Albumin 2.7 (L) 3.5 - 5.0 g/dL   AST 57 (H) 15 - 41 U/L   ALT 73 (H) 0 - 44 U/L   Alkaline Phosphatase 154 (H) 38 - 126 U/L   Total Bilirubin 1.2 0.3 - 1.2 mg/dL   GFR, Estimated >60 >60 mL/min   Anion gap 9 5 - 15  CBC with Differential/Platelet     Status: Abnormal   Collection Time: 07/13/22  4:47 AM  Result Value Ref Range   WBC 10.5 4.0 - 10.5 K/uL   RBC 4.62 3.87 - 5.11 MIL/uL   Hemoglobin 10.9 (L) 12.0 - 15.0 g/dL   HCT 35.8 (L) 36.0 - 46.0 %  MCV 77.5 (L) 80.0 - 100.0 fL   MCH 23.6 (L) 26.0 - 34.0 pg   MCHC 30.4 30.0 - 36.0 g/dL   RDW 14.6 11.5 - 15.5 %   Platelets 268 150 - 400 K/uL   nRBC 0.0 0.0 - 0.2 %   Neutrophils Relative % 79 %   Neutro Abs 8.4 (H) 1.7 - 7.7 K/uL   Lymphocytes Relative 8 %   Lymphs Abs 0.8 0.7 - 4.0 K/uL   Monocytes Relative 11 %   Monocytes Absolute 1.2 (H) 0.1 - 1.0 K/uL   Eosinophils Relative 0 %   Eosinophils Absolute 0.0 0.0 - 0.5 K/uL   Basophils Relative 1 %   Basophils Absolute 0.1 0.0 - 0.1 K/uL   Immature Granulocytes 1 %   Abs Immature Granulocytes 0.10 (H) 0.00 - 0.07 K/uL  Magnesium     Status: None   Collection Time: 07/13/22  4:47 AM  Result Value Ref Range   Magnesium 2.1 1.7 - 2.4 mg/dL    I have reviewed pertinent nursing notes, vitals, labs, and images as necessary. I have ordered labwork to follow up on as indicated.  I have reviewed the last notes from staff  over past 24 hours. I have discussed patient's care plan and test results with nursing staff, CM/SW, and other staff as appropriate.  Time spent: Greater than 50% of the 55 minute visit was spent in counseling/coordination of care for the patient as laid out in the A&P.   LOS: 0 days   Dwyane Dee, MD [facility] 07/13/2022, 10:01 AM

## 2022-07-13 NOTE — Assessment & Plan Note (Addendum)
-   baseline creatinine ~ suspected normal - patient presents with increase in creat >0.3 mg/dL above baseline, creat increase >1.5x baseline presumed to have occurred within past 7 days PTA - creat 1.13 on admission but GFR also noted down to ~56. Unclear if underlying CKD but need to trend BMP; unable to view outpatient labwork -Creatinine has normalized with fluids

## 2022-07-13 NOTE — Anesthesia Preprocedure Evaluation (Addendum)
Anesthesia Evaluation  Patient identified by MRN, date of birth, ID band Patient awake    Reviewed: Allergy & Precautions, NPO status , Patient's Chart, lab work & pertinent test results  Airway Mallampati: III  TM Distance: >3 FB Neck ROM: Full    Dental  (+) Poor Dentition, Chipped, Dental Advisory Given   Pulmonary former smoker   Pulmonary exam normal breath sounds clear to auscultation       Cardiovascular negative cardio ROS Normal cardiovascular exam Rhythm:Regular Rate:Normal     Neuro/Psych negative neurological ROS  negative psych ROS   GI/Hepatic negative GI ROS, Neg liver ROS,,,  Endo/Other  negative endocrine ROS    Renal/GU Renal disease  negative genitourinary   Musculoskeletal negative musculoskeletal ROS (+)    Abdominal   Peds  Hematology negative hematology ROS (+)   Anesthesia Other Findings Right ureteral stone  Reproductive/Obstetrics                             Anesthesia Physical Anesthesia Plan  ASA: 2  Anesthesia Plan: General   Post-op Pain Management:    Induction: Intravenous  PONV Risk Score and Plan: 3 and Ondansetron, Dexamethasone and Midazolam  Airway Management Planned: LMA  Additional Equipment:   Intra-op Plan:   Post-operative Plan: Extubation in OR  Informed Consent: I have reviewed the patients History and Physical, chart, labs and discussed the procedure including the risks, benefits and alternatives for the proposed anesthesia with the patient or authorized representative who has indicated his/her understanding and acceptance.     Dental advisory given  Plan Discussed with: CRNA  Anesthesia Plan Comments:        Anesthesia Quick Evaluation

## 2022-07-13 NOTE — Anesthesia Procedure Notes (Signed)
Procedure Name: LMA Insertion Date/Time: 07/13/2022 7:48 AM  Performed by: Jonna Munro, CRNAPre-anesthesia Checklist: Patient identified, Emergency Drugs available, Suction available, Patient being monitored and Timeout performed Patient Re-evaluated:Patient Re-evaluated prior to induction Oxygen Delivery Method: Circle system utilized Preoxygenation: Pre-oxygenation with 100% oxygen Induction Type: IV induction LMA: LMA inserted LMA Size: 4.0 Number of attempts: 1 Placement Confirmation: positive ETCO2, CO2 detector and breath sounds checked- equal and bilateral Tube secured with: Tape Dental Injury: Teeth and Oropharynx as per pre-operative assessment

## 2022-07-13 NOTE — Hospital Course (Addendum)
Denise Hudson is a 62 y.o. F with hx kidney stones who presented with right flank pain, hematuria, nausea/vomiting.   CT showed multiple stones in the distal right ureter with right perinephric stranding.   3/16: Admitted on antibiotics 3/17: Urology consulted, underwent cysto and RIGHT stent placement 3/18: Persistent pain 3/19: Recurrent fever, cultures of blood redrawn  3/20: Still with intense pain, again fever, US renal unremarkable 3/22: Still with intense pain, again fever, CT abdomen and pelvis normal

## 2022-07-13 NOTE — Discharge Instructions (Signed)

## 2022-07-13 NOTE — Op Note (Signed)
Operative Note  Preoperative diagnosis:  1.  Right ureteral calculus with urinary tract infection, pyelonephritis  Post operative diagnosis: 1.  Same  Procedure(s): 1.  Cystoscopy with right retrograde pyelogram and right ureteral stent placement  Surgeon: Link Snuffer, MD  Assistants: None  Anesthesia: General  Complications: None immediate  EBL: Minimal  Specimens: 1.  None  Drains/Catheters: 1.  6 X 26 double-J ureteral stent  Intraoperative findings: 1.  Normal urethra and bladder 2.  Right retrograde pyelogram revealed a filling defect at the level of the stone with upstream hydroureteronephrosis  Indication: 62 year old female with distal right ureteral calculi.  She had a fever overnight.  Therefore, presents for ureteral stent placement.  Description of procedure:  The patient was identified and consent was obtained.  The patient was taken to the operating room and placed in the supine position.  The patient was placed under general anesthesia.  Perioperative antibiotics were administered.  The patient was placed in dorsal lithotomy.  Patient was prepped and draped in a standard sterile fashion and a timeout was performed.  A 21 French rigid cystoscope was advanced into the urethra and into the bladder.  The right distal most portion of the ureter was cannulated with an open-ended ureteral catheter.  Retrograde pyelogram was performed with the findings noted above.  A sensor wire was then advanced up to the kidney under fluoroscopic guidance.  A 6 X 26 double-J ureteral stent was advanced up to the kidney under fluoroscopic guidance.  The wire was withdrawn and fluoroscopy confirmed good proximal placement and direct visualization confirmed a good coil within the bladder.  The bladder was drained and the scope withdrawn.  This concluded the operation.  Patient tolerated procedure well and was stable postoperatively.  Plan: Continue antibiotics until cultures speciate.   Follow-up in 1 to 2 weeks or so for ureteroscopy after appropriate treatment for her pyelonephritis.

## 2022-07-13 NOTE — H&P (Signed)
H&P Physician requesting consult: Kristopher Oppenheim  Chief Complaint: Right ureteral calculus  History of Present Illness: 62 year old female with history of nephrolithiasis presented with right-sided flank pain nausea and vomiting yesterday.  Has required ureteroscopy in the past.  She was afebrile with vital signs stable with no leukocytosis and creatinine 1.1.  Urinalysis with leukocyte esterase positive, nitrite negative, greater than 50 WBCs and many bacteria.  Also had some perinephric fat stranding.  CT showed multiple stacked stones in the distal right ureter and patchy nephrogram concerning for pyelonephritis in the right kidney.  Patient denies any irritative voiding complaints.  No dysuria.  Some hematuria.  She continues to have some flank pain.  She did have a fever overnight of 101.4.  Is now afebrile.  Past Medical History:  Diagnosis Date   History of kidney stones    Nephrolithiasis    left non-obstructive per CT 02-10-2017   Right ureteral stone    Wears contact lenses    Past Surgical History:  Procedure Laterality Date   APPENDECTOMY  age 89   CYSTOSCOPY WITH STENT PLACEMENT Right 02/10/2017   Procedure: CYSTOSCOPY WITH RIGHT STENT PLACEMENT;  Surgeon: Ceasar Mons, MD;  Location: [facility];  Service: Urology;  Laterality: Right;   CYSTOSCOPY/URETEROSCOPY/HOLMIUM LASER/STENT PLACEMENT Right 03/04/2017   Procedure: CYSTOSCOPY/URETEROSCOPY/HOLMIUM LASER/STENT PLACEMENT;  Surgeon: Ceasar Mons, MD;  Location: [facility];  Service: Urology;  Laterality: Right;  NEEDS 30 MIN FOR PROCEDURE   EXTRACORPOREAL SHOCK WAVE LITHOTRIPSY  2008    Home Medications:  No medications prior to admission.   Allergies: No Known Allergies  History reviewed. No pertinent family history. Social History:  reports that she quit smoking about 8 years ago. Her smoking use included cigarettes. She has never used smokeless tobacco. She reports that she does not drink  alcohol and does not use drugs.  ROS: A complete review of systems was performed.  All systems are negative except for pertinent findings as noted. ROS   Physical Exam:  Vital signs in last 24 hours: Temp:  [98.1 F (36.7 C)-101.4 F (38.6 C)] 98.1 F (36.7 C) (03/17 0651) Pulse Rate:  [64-94] 64 (03/17 0651) Resp:  [16-18] 16 (03/17 0602) BP: (161-187)/(68-82) 165/76 (03/17 0651) SpO2:  [89 %-99 %] 94 % (03/17 0651) Weight:  [62 kg-63.5 kg] 62 kg (03/17 0541) General:  Alert and oriented, No acute distress HEENT: Normocephalic, atraumatic Neck: No JVD or lymphadenopathy Cardiovascular: Regular rate and rhythm Lungs: Regular rate and effort Abdomen: Soft, nontender, nondistended, no abdominal masses Back: No CVA tenderness Extremities: No edema Neurologic: Grossly intact  Laboratory Data:  Results for orders placed or performed during the hospital encounter of 07/12/22 (from the past 24 hour(s))  Comprehensive metabolic panel     Status: Abnormal   Collection Time: 07/12/22  5:10 PM  Result Value Ref Range   Sodium 135 135 - 145 mmol/L   Potassium 3.6 3.5 - 5.1 mmol/L   Chloride 99 98 - 111 mmol/L   CO2 23 22 - 32 mmol/L   Glucose, Bld 114 (H) 70 - 99 mg/dL   BUN 34 (H) 6 - 20 mg/dL   Creatinine, Ser 1.13 (H) 0.44 - 1.00 mg/dL   Calcium 9.0 8.9 - 10.3 mg/dL   Total Protein 8.2 (H) 6.5 - 8.1 g/dL   Albumin 3.1 (L) 3.5 - 5.0 g/dL   AST 78 (H) 15 - 41 U/L   ALT 93 (H) 0 - 44 U/L   Alkaline Phosphatase  182 (H) 38 - 126 U/L   Total Bilirubin 1.9 (H) 0.3 - 1.2 mg/dL   GFR, Estimated 56 (L) >60 mL/min   Anion gap 13 5 - 15  CBC with Differential     Status: Abnormal   Collection Time: 07/12/22  5:10 PM  Result Value Ref Range   WBC 10.5 4.0 - 10.5 K/uL   RBC 4.92 3.87 - 5.11 MIL/uL   Hemoglobin 11.6 (L) 12.0 - 15.0 g/dL   HCT 37.2 36.0 - 46.0 %   MCV 75.6 (L) 80.0 - 100.0 fL   MCH 23.6 (L) 26.0 - 34.0 pg   MCHC 31.2 30.0 - 36.0 g/dL   RDW 14.0 11.5 - 15.5 %    Platelets 279 150 - 400 K/uL   nRBC 0.0 0.0 - 0.2 %   Neutrophils Relative % 64 %   Neutro Abs 6.8 1.7 - 7.7 K/uL   Lymphocytes Relative 14 %   Lymphs Abs 1.5 0.7 - 4.0 K/uL   Monocytes Relative 18 %   Monocytes Absolute 1.9 (H) 0.1 - 1.0 K/uL   Eosinophils Relative 0 %   Eosinophils Absolute 0.0 0.0 - 0.5 K/uL   Basophils Relative 1 %   Basophils Absolute 0.1 0.0 - 0.1 K/uL   Immature Granulocytes 3 %   Abs Immature Granulocytes 0.31 (H) 0.00 - 0.07 K/uL  Urinalysis, Routine w reflex microscopic -Urine, Clean Catch     Status: Abnormal   Collection Time: 07/12/22  6:30 PM  Result Value Ref Range   Color, Urine AMBER (A) YELLOW   APPearance HAZY (A) CLEAR   Specific Gravity, Urine 1.032 (H) 1.005 - 1.030   pH 5.0 5.0 - 8.0   Glucose, UA NEGATIVE NEGATIVE mg/dL   Hgb urine dipstick MODERATE (A) NEGATIVE   Bilirubin Urine NEGATIVE NEGATIVE   Ketones, ur NEGATIVE NEGATIVE mg/dL   Protein, ur 30 (A) NEGATIVE mg/dL   Nitrite NEGATIVE NEGATIVE   Leukocytes,Ua LARGE (A) NEGATIVE   RBC / HPF 21-50 0 - 5 RBC/hpf   WBC, UA >50 0 - 5 WBC/hpf   Bacteria, UA MANY (A) NONE SEEN   Squamous Epithelial / HPF 6-10 0 - 5 /HPF   Mucus PRESENT    Hyaline Casts, UA PRESENT   Surgical PCR screen     Status: None   Collection Time: 07/13/22  3:10 AM   Specimen: Nasal Mucosa; Nasal Swab  Result Value Ref Range   MRSA, PCR NEGATIVE NEGATIVE   Staphylococcus aureus NEGATIVE NEGATIVE  Comprehensive metabolic panel     Status: Abnormal   Collection Time: 07/13/22  4:47 AM  Result Value Ref Range   Sodium 138 135 - 145 mmol/L   Potassium 4.2 3.5 - 5.1 mmol/L   Chloride 104 98 - 111 mmol/L   CO2 25 22 - 32 mmol/L   Glucose, Bld 108 (H) 70 - 99 mg/dL   BUN 28 (H) 6 - 20 mg/dL   Creatinine, Ser 0.99 0.44 - 1.00 mg/dL   Calcium 8.6 (L) 8.9 - 10.3 mg/dL   Total Protein 7.0 6.5 - 8.1 g/dL   Albumin 2.7 (L) 3.5 - 5.0 g/dL   AST 57 (H) 15 - 41 U/L   ALT 73 (H) 0 - 44 U/L   Alkaline Phosphatase 154  (H) 38 - 126 U/L   Total Bilirubin 1.2 0.3 - 1.2 mg/dL   GFR, Estimated >60 >60 mL/min   Anion gap 9 5 - 15  CBC with Differential/Platelet  Status: Abnormal   Collection Time: 07/13/22  4:47 AM  Result Value Ref Range   WBC 10.5 4.0 - 10.5 K/uL   RBC 4.62 3.87 - 5.11 MIL/uL   Hemoglobin 10.9 (L) 12.0 - 15.0 g/dL   HCT 35.8 (L) 36.0 - 46.0 %   MCV 77.5 (L) 80.0 - 100.0 fL   MCH 23.6 (L) 26.0 - 34.0 pg   MCHC 30.4 30.0 - 36.0 g/dL   RDW 14.6 11.5 - 15.5 %   Platelets 268 150 - 400 K/uL   nRBC 0.0 0.0 - 0.2 %   Neutrophils Relative % 79 %   Neutro Abs 8.4 (H) 1.7 - 7.7 K/uL   Lymphocytes Relative 8 %   Lymphs Abs 0.8 0.7 - 4.0 K/uL   Monocytes Relative 11 %   Monocytes Absolute 1.2 (H) 0.1 - 1.0 K/uL   Eosinophils Relative 0 %   Eosinophils Absolute 0.0 0.0 - 0.5 K/uL   Basophils Relative 1 %   Basophils Absolute 0.1 0.0 - 0.1 K/uL   Immature Granulocytes 1 %   Abs Immature Granulocytes 0.10 (H) 0.00 - 0.07 K/uL  Magnesium     Status: None   Collection Time: 07/13/22  4:47 AM  Result Value Ref Range   Magnesium 2.1 1.7 - 2.4 mg/dL   Recent Results (from the past 240 hour(s))  Surgical PCR screen     Status: None   Collection Time: 07/13/22  3:10 AM   Specimen: Nasal Mucosa; Nasal Swab  Result Value Ref Range Status   MRSA, PCR NEGATIVE NEGATIVE Final   Staphylococcus aureus NEGATIVE NEGATIVE Final    Comment: (NOTE) The Xpert SA Assay (FDA approved for NASAL specimens in patients 76 years of age and older), is one component of a comprehensive surveillance program. It is not intended to diagnose infection nor to guide or monitor treatment. Performed at [facility], [place] [address] [place], [place] [address]    Creatinine: Recent Labs    07/12/22 1710 07/13/22 0447  CREATININE 1.13* 0.99   CT scan personally reviewed and is detailed in history of present illness  Impression/Assessment:  Right ureteral calculi Urinary tract infection,  possible pyelonephritis  Plan:  Proceed with cystoscopy with right retrograde pyelogram, right ureteral stent placement.  Hold off on ureteroscopy given her fever overnight and urinalysis with many bacteria.  Continue antibiotics until cultures speciate and she will need ureteroscopy down the road.  Marton Redwood, III 07/13/2022, 7:36 AM

## 2022-07-14 ENCOUNTER — Other Ambulatory Visit: Payer: Self-pay | Admitting: Urology

## 2022-07-14 ENCOUNTER — Encounter (HOSPITAL_COMMUNITY): Payer: Self-pay | Admitting: Urology

## 2022-07-14 DIAGNOSIS — D509 Iron deficiency anemia, unspecified: Secondary | ICD-10-CM | POA: Diagnosis present

## 2022-07-14 DIAGNOSIS — Z9049 Acquired absence of other specified parts of digestive tract: Secondary | ICD-10-CM | POA: Diagnosis not present

## 2022-07-14 DIAGNOSIS — B9620 Unspecified Escherichia coli [E. coli] as the cause of diseases classified elsewhere: Secondary | ICD-10-CM | POA: Diagnosis present

## 2022-07-14 DIAGNOSIS — R509 Fever, unspecified: Secondary | ICD-10-CM | POA: Diagnosis not present

## 2022-07-14 DIAGNOSIS — R7401 Elevation of levels of liver transaminase levels: Secondary | ICD-10-CM | POA: Diagnosis not present

## 2022-07-14 DIAGNOSIS — N136 Pyonephrosis: Secondary | ICD-10-CM | POA: Diagnosis present

## 2022-07-14 DIAGNOSIS — Z79899 Other long term (current) drug therapy: Secondary | ICD-10-CM | POA: Diagnosis not present

## 2022-07-14 DIAGNOSIS — N12 Tubulo-interstitial nephritis, not specified as acute or chronic: Secondary | ICD-10-CM | POA: Diagnosis not present

## 2022-07-14 DIAGNOSIS — Z87440 Personal history of urinary (tract) infections: Secondary | ICD-10-CM | POA: Diagnosis not present

## 2022-07-14 DIAGNOSIS — Z7982 Long term (current) use of aspirin: Secondary | ICD-10-CM | POA: Diagnosis not present

## 2022-07-14 DIAGNOSIS — I10 Essential (primary) hypertension: Secondary | ICD-10-CM | POA: Diagnosis present

## 2022-07-14 DIAGNOSIS — Z87891 Personal history of nicotine dependence: Secondary | ICD-10-CM | POA: Diagnosis not present

## 2022-07-14 DIAGNOSIS — N201 Calculus of ureter: Secondary | ICD-10-CM | POA: Diagnosis not present

## 2022-07-14 DIAGNOSIS — N202 Calculus of kidney with calculus of ureter: Secondary | ICD-10-CM | POA: Diagnosis present

## 2022-07-14 DIAGNOSIS — N179 Acute kidney failure, unspecified: Secondary | ICD-10-CM | POA: Diagnosis not present

## 2022-07-14 MED ORDER — OXYBUTYNIN CHLORIDE 5 MG PO TABS
5.0000 mg | ORAL_TABLET | Freq: Three times a day (TID) | ORAL | Status: DC
  Administered 2022-07-14 – 2022-07-19 (×16): 5 mg via ORAL
  Filled 2022-07-14 (×16): qty 1

## 2022-07-14 MED ORDER — OXYCODONE HCL 5 MG PO TABS
5.0000 mg | ORAL_TABLET | ORAL | Status: DC | PRN
  Administered 2022-07-14 – 2022-07-18 (×10): 5 mg via ORAL
  Filled 2022-07-14 (×10): qty 1

## 2022-07-14 NOTE — Progress Notes (Signed)
Progress Note    Denise Hudson   [id number]  DOB: [birth year]  DOA: 07/12/2022     0 PCP: Patient, No Pcp Per  Initial CC: R flank pain, N/V  Hospital Course: Denise Hudson is a 62 yo female with PMH ureterolithiasis, nephrolithiasis who presented with right flank pain, hematuria, nausea/vomiting.  She last had stones in 2018 requiring cystoscopy and lithotripsy. She was found to be febrile in the ER.  CT abdomen/pelvis was performed which showed multiple stones in the distal right ureter with right perinephric stranding. Urinalysis was consistent with UTI and she was started on Rocephin.  Blood cultures were also obtained. Urology was consulted and she underwent cystoscopy with right ureteral stent placement on 07/13/2022.  Interval History:  No events overnight.  She was having some significant right groin pain going down to her leg this morning.  From description, sounds like possible pain related to the recent stent as not having pain like this prior to admission.  Started her on oxybutynin and added oral oxycodone. She did not feel comfortable discharging home due to the pain, we will target tomorrow.  Assessment and Plan: * Ureterolithiasis - Multiple stones noted on CT abdomen/pelvis involving right ureter with upstream hydroureteronephrosis noted on cystoscopy -Patient underwent right ureteral stent placement with urology on 07/13/2022 -Patient will follow-up with urology outpatient for eventual ureteroscopy  Pyelonephritis of right kidney - UA noted with large LE, negative nitrite, greater than 50 WBC, many bacteria - Continue Rocephin - Follow-up urine and blood cultures -Urine culture currently growing E. coli, follow-up sensitivities -Given right perinephric stranding, will target 10-day course for treatment  AKI (acute kidney injury) (HCC)-resolved as of 07/14/2022 - baseline creatinine ~ suspected normal - patient presents with increase in creat >0.3 mg/dL above  baseline, creat increase >1.5x baseline presumed to have occurred within past 7 days PTA - creat 1.13 on admission but GFR also noted down to ~56. Unclear if underlying CKD but need to trend BMP; unable to view outpatient labwork -Creatinine has normalized with fluids    Old records reviewed in assessment of this patient  Antimicrobials: Rocephin 07/12/22 >> current  DVT prophylaxis:  SCDs Start: 07/12/22 2135   Code Status:   Code Status: Full Code  Mobility Assessment (last 72 hours)     Mobility Assessment     Row Name 07/14/22 0853 07/13/22 2010 07/13/22 1100 07/12/22 2145     Does patient have an order for bedrest or is patient medically unstable No - Continue assessment No - Continue assessment No - Continue assessment No - Continue assessment    What is the highest level of mobility based on the progressive mobility assessment? Level 6 (Walks independently in room and hall) - Balance while walking in room without assist - Complete Level 5 (Walks with assist in room/hall) - Balance while stepping forward/back and can walk in room with assist - Complete Level 5 (Walks with assist in room/hall) - Balance while stepping forward/back and can walk in room with assist - Complete Level 6 (Walks independently in room and hall) - Balance while walking in room without assist - Complete             Barriers to discharge: none Disposition Plan:  Home Tuesday Status is: Inpatient  Objective: Blood pressure (!) 160/69, pulse 66, temperature 99.6 F (37.6 C), temperature source Oral, resp. rate 18, height 6' (1.829 m), weight 67.2 kg, last menstrual period 09/23/2015, SpO2 98 %.  Examination:  Physical Exam  Constitutional:      General: She is not in acute distress.    Appearance: Normal appearance.  HENT:     Head: Normocephalic and atraumatic.     Mouth/Throat:     Mouth: Mucous membranes are moist.  Eyes:     Extraocular Movements: Extraocular movements intact.   Cardiovascular:     Rate and Rhythm: Regular rhythm. Bradycardia present.  Pulmonary:     Effort: Pulmonary effort is normal. No respiratory distress.     Breath sounds: Normal breath sounds. No wheezing.  Abdominal:     General: Bowel sounds are normal. There is no distension.     Palpations: Abdomen is soft.     Tenderness: There is no abdominal tenderness.  Musculoskeletal:        General: No swelling. Normal range of motion.     Cervical back: Normal range of motion and neck supple.  Skin:    General: Skin is dry.  Neurological:     General: No focal deficit present.     Mental Status: She is alert.  Psychiatric:        Mood and Affect: Mood normal.        Behavior: Behavior normal.      Consultants:  Urology  Procedures:  07/13/22: Cystoscopy with right ureteral stent placement   Data Reviewed: No results found for this or any previous visit (from the past 24 hour(s)).   I have reviewed pertinent nursing notes, vitals, labs, and images as necessary. I have ordered labwork to follow up on as indicated.  I have reviewed the last notes from staff over past 24 hours. I have discussed patient's care plan and test results with nursing staff, CM/SW, and other staff as appropriate.  Time spent: Greater than 50% of the 55 minute visit was spent in counseling/coordination of care for the patient as laid out in the A&P.   LOS: 0 days   Dwyane Dee, MD [facility] 07/14/2022, 1:48 PM

## 2022-07-14 NOTE — TOC CM/SW Note (Signed)
  Transition of Care [facility]) Screening Note   Patient Details  Name: Denise Hudson Date of Birth: [birth year]   Transition of [facility]) CM/SW Contact:    Lennart Pall, LCSW Phone Number: 07/14/2022, 11:51 AM    Transition of Care Department [facility]) has reviewed patient and no TOC needs have been identified at this time. We will continue to monitor patient advancement through interdisciplinary progression rounds. If new patient transition needs arise, please place a TOC consult.

## 2022-07-15 DIAGNOSIS — R509 Fever, unspecified: Secondary | ICD-10-CM | POA: Diagnosis not present

## 2022-07-15 DIAGNOSIS — N179 Acute kidney failure, unspecified: Secondary | ICD-10-CM | POA: Diagnosis not present

## 2022-07-15 DIAGNOSIS — N12 Tubulo-interstitial nephritis, not specified as acute or chronic: Secondary | ICD-10-CM | POA: Diagnosis not present

## 2022-07-15 DIAGNOSIS — N201 Calculus of ureter: Secondary | ICD-10-CM | POA: Diagnosis not present

## 2022-07-15 LAB — URINE CULTURE: Culture: >=100000 — AB

## 2022-07-15 MED ORDER — CEFADROXIL 500 MG PO CAPS
1000.0000 mg | ORAL_CAPSULE | Freq: Two times a day (BID) | ORAL | Status: DC
  Administered 2022-07-15 (×2): 1000 mg via ORAL
  Filled 2022-07-15 (×3): qty 2

## 2022-07-15 MED ORDER — SENNOSIDES-DOCUSATE SODIUM 8.6-50 MG PO TABS
1.0000 | ORAL_TABLET | Freq: Two times a day (BID) | ORAL | Status: DC
  Administered 2022-07-15 – 2022-07-19 (×9): 1 via ORAL
  Filled 2022-07-15 (×9): qty 1

## 2022-07-15 MED ORDER — IBUPROFEN 400 MG PO TABS: 400.0000 mg | ORAL_TABLET | Freq: Four times a day (QID) | ORAL | Status: DC | PRN

## 2022-07-15 MED ORDER — SODIUM CHLORIDE 0.9 % IV SOLN: INTRAVENOUS | Status: DC

## 2022-07-15 MED ORDER — POLYETHYLENE GLYCOL 3350 17 G PO PACK
17.0000 g | PACK | Freq: Every day | ORAL | Status: DC
  Administered 2022-07-15 – 2022-07-19 (×5): 17 g via ORAL
  Filled 2022-07-15 (×5): qty 1

## 2022-07-15 MED ORDER — ACETAMINOPHEN 325 MG PO TABS
650.0000 mg | ORAL_TABLET | ORAL | Status: DC | PRN
  Administered 2022-07-15 – 2022-07-17 (×4): 650 mg via ORAL
  Filled 2022-07-15 (×4): qty 2

## 2022-07-15 NOTE — Assessment & Plan Note (Addendum)
-   patient began developing fever on 3/18 s/p stent placement on 3/17. Admission blood cultures remain negative but she's been developing right groin spasms/pain felt to be related to stent placement -Given new onset of fevers, repeating blood cultures on 07/15/2022.  Urine culture growing pansensitive E. coli.  Continue on Rocephin for now - may need further imaging with CT A/P if no improvement by tomorrow

## 2022-07-15 NOTE — Progress Notes (Signed)
Progress Note    Denise Hudson   [id number]  DOB: [birth year]  DOA: 07/12/2022     1 PCP: Patient, No Pcp Per  Initial CC: R flank pain, N/V  Hospital Course: Denise Hudson is a 62 yo female with PMH ureterolithiasis, nephrolithiasis who presented with right flank pain, hematuria, nausea/vomiting.  She last had stones in 2018 requiring cystoscopy and lithotripsy. She was found to be febrile in the ER.  CT abdomen/pelvis was performed which showed multiple stones in the distal right ureter with right perinephric stranding. Urinalysis was consistent with UTI and she was started on Rocephin.  Blood cultures were also obtained. Urology was consulted and she underwent cystoscopy with right ureteral stent placement on 07/13/2022.  Interval History:  Continues to have ongoing fevers and uncontrolled right groin pain.  Repeating blood cultures today.  Assessment and Plan: * Ureterolithiasis - Multiple stones noted on CT abdomen/pelvis involving right ureter with upstream hydroureteronephrosis noted on cystoscopy -Patient underwent right ureteral stent placement with urology on 07/13/2022 -Patient will follow-up with urology outpatient for eventual ureteroscopy  Fever - patient began developing fever on 3/18 s/p stent placement on 3/17. Admission blood cultures remain negative but she's been developing right groin spasms/pain felt to be related to stent placement -Given new onset of fevers, repeating blood cultures on 07/15/2022.  Urine culture growing pansensitive E. coli.  Continue on Rocephin for now - may need further imaging with CT A/P if no improvement by tomorrow   Pyelonephritis of right kidney - UA noted with large LE, negative nitrite, greater than 50 WBC, many bacteria - Continue Rocephin - Follow-up urine and blood cultures -Urine culture currently growing pansensitive E. coli -Given right perinephric stranding, will target 10-day course for treatment -Following fever curve, see  above  AKI (acute kidney injury) (HCC)-resolved as of 07/14/2022 - baseline creatinine ~ suspected normal - patient presents with increase in creat >0.3 mg/dL above baseline, creat increase >1.5x baseline presumed to have occurred within past 7 days PTA - creat 1.13 on admission but GFR also noted down to ~56. Unclear if underlying CKD but need to trend BMP; unable to view outpatient labwork -Creatinine has normalized with fluids    Old records reviewed in assessment of this patient  Antimicrobials: Rocephin 07/12/22 >> current  DVT prophylaxis:  SCDs Start: 07/12/22 2135   Code Status:   Code Status: Full Code  Mobility Assessment (last 72 hours)     Mobility Assessment     Row Name 07/15/22 0925 07/14/22 0853 07/13/22 2010 07/13/22 1100 07/12/22 2145   Does patient have an order for bedrest or is patient medically unstable No - Continue assessment No - Continue assessment No - Continue assessment No - Continue assessment No - Continue assessment   What is the highest level of mobility based on the progressive mobility assessment? Level 5 (Walks with assist in room/hall) - Balance while stepping forward/back and can walk in room with assist - Complete Level 6 (Walks independently in room and hall) - Balance while walking in room without assist - Complete Level 5 (Walks with assist in room/hall) - Balance while stepping forward/back and can walk in room with assist - Complete Level 5 (Walks with assist in room/hall) - Balance while stepping forward/back and can walk in room with assist - Complete Level 6 (Walks independently in room and hall) - Balance while walking in room without assist - Complete            Barriers to  discharge: none Disposition Plan:  Home 2-3 days Status is: Inpatient  Objective: Blood pressure (!) 178/72, pulse 61, temperature 100 F (37.8 C), temperature source Oral, resp. rate 18, height 6' (1.829 m), weight 67.2 kg, last menstrual period 09/23/2015,  SpO2 100 %.  Examination:  Physical Exam Constitutional:      General: She is not in acute distress.    Appearance: Normal appearance.  HENT:     Head: Normocephalic and atraumatic.     Mouth/Throat:     Mouth: Mucous membranes are moist.  Eyes:     Extraocular Movements: Extraocular movements intact.  Cardiovascular:     Rate and Rhythm: Normal rate and regular rhythm.  Pulmonary:     Effort: Pulmonary effort is normal. No respiratory distress.     Breath sounds: Normal breath sounds. No wheezing.  Abdominal:     General: Bowel sounds are normal. There is no distension.     Palpations: Abdomen is soft.     Tenderness: There is abdominal tenderness (right groin mildly TTP).  Musculoskeletal:        General: No swelling. Normal range of motion.     Cervical back: Normal range of motion and neck supple.  Skin:    General: Skin is dry.  Neurological:     General: No focal deficit present.     Mental Status: She is alert.  Psychiatric:        Mood and Affect: Mood normal.        Behavior: Behavior normal.      Consultants:  Urology  Procedures:  07/13/22: Cystoscopy with right ureteral stent placement   Data Reviewed: No results found for this or any previous visit (from the past 24 hour(s)).   I have reviewed pertinent nursing notes, vitals, labs, and images as necessary. I have ordered labwork to follow up on as indicated.  I have reviewed the last notes from staff over past 24 hours. I have discussed patient's care plan and test results with nursing staff, CM/SW, and other staff as appropriate.  Time spent: Greater than 50% of the 55 minute visit was spent in counseling/coordination of care for the patient as laid out in the A&P.   LOS: 1 day   Dwyane Dee, MD [facility] 07/15/2022, 1:14 PM

## 2022-07-15 NOTE — Anesthesia Postprocedure Evaluation (Signed)
Anesthesia Post Note  Patient: Denise Hudson  Procedure(s) Performed: CYSTOSCOPY RIGHT URETERAL STENT PLACEMENT (Right: Ureter)     Patient location during evaluation: PACU Anesthesia Type: General Level of consciousness: awake and alert Pain management: pain level controlled Vital Signs Assessment: post-procedure vital signs reviewed and stable Respiratory status: spontaneous breathing, nonlabored ventilation, respiratory function stable and patient connected to nasal cannula oxygen Cardiovascular status: blood pressure returned to baseline and stable Postop Assessment: no apparent nausea or vomiting Anesthetic complications: no  No notable events documented.  Last Vitals:  Vitals:   07/15/22 0225 07/15/22 0522  BP:  (!) 178/79  Pulse:  73  Resp:  16  Temp: 36.7 C (!) 38.4 C  SpO2:  96%    Last Pain:  Vitals:   07/15/22 0612  TempSrc:   PainSc: Asleep                 Leane Loring L Kwabena Strutz

## 2022-07-16 ENCOUNTER — Inpatient Hospital Stay (HOSPITAL_COMMUNITY): Payer: BC Managed Care – PPO

## 2022-07-16 DIAGNOSIS — D649 Anemia, unspecified: Secondary | ICD-10-CM | POA: Insufficient documentation

## 2022-07-16 DIAGNOSIS — N201 Calculus of ureter: Secondary | ICD-10-CM | POA: Diagnosis not present

## 2022-07-16 DIAGNOSIS — D509 Iron deficiency anemia, unspecified: Secondary | ICD-10-CM | POA: Insufficient documentation

## 2022-07-16 DIAGNOSIS — R7401 Elevation of levels of liver transaminase levels: Secondary | ICD-10-CM | POA: Insufficient documentation

## 2022-07-16 LAB — BASIC METABOLIC PANEL
Anion gap: 7 (ref 5–15)
BUN: 17 mg/dL (ref 6–20)
CO2: 25 mmol/L (ref 22–32)
Calcium: 8.2 mg/dL — ABNORMAL LOW (ref 8.9–10.3)
Chloride: 104 mmol/L (ref 98–111)
Creatinine, Ser: 0.82 mg/dL (ref 0.44–1.00)
GFR, Estimated: >60 mL/min (ref >60)
Glucose, Bld: 102 mg/dL — ABNORMAL HIGH (ref 70–99)
Potassium: 3.6 mmol/L (ref 3.5–5.1)
Sodium: 136 mmol/L (ref 135–145)

## 2022-07-16 LAB — CBC WITH DIFFERENTIAL/PLATELET
Abs Immature Granulocytes: 0.09 10*3/uL — ABNORMAL HIGH (ref 0.00–0.07)
Basophils Absolute: 0.1 10*3/uL (ref 0.0–0.1)
Basophils Relative: 1 %
Eosinophils Absolute: 0.1 10*3/uL (ref 0.0–0.5)
Eosinophils Relative: 1 %
HCT: 28.4 % — ABNORMAL LOW (ref 36.0–46.0)
Hemoglobin: 9 g/dL — ABNORMAL LOW (ref 12.0–15.0)
Immature Granulocytes: 1 %
Lymphocytes Relative: 10 %
Lymphs Abs: 1 10*3/uL (ref 0.7–4.0)
MCH: 23.6 pg — ABNORMAL LOW (ref 26.0–34.0)
MCHC: 31.7 g/dL (ref 30.0–36.0)
MCV: 74.5 fL — ABNORMAL LOW (ref 80.0–100.0)
Monocytes Absolute: 1 10*3/uL (ref 0.1–1.0)
Monocytes Relative: 9 %
Neutro Abs: 8.3 10*3/uL — ABNORMAL HIGH (ref 1.7–7.7)
Neutrophils Relative %: 78 %
Platelets: 320 10*3/uL (ref 150–400)
RBC: 3.81 MIL/uL — ABNORMAL LOW (ref 3.87–5.11)
RDW: 14.4 % (ref 11.5–15.5)
WBC: 10.5 10*3/uL (ref 4.0–10.5)
nRBC: 0 % (ref 0.0–0.2)

## 2022-07-16 LAB — MAGNESIUM: Magnesium: 1.9 mg/dL (ref 1.7–2.4)

## 2022-07-16 MED ORDER — PHENAZOPYRIDINE HCL 100 MG PO TABS
200.0000 mg | ORAL_TABLET | Freq: Three times a day (TID) | ORAL | Status: AC
  Administered 2022-07-16 – 2022-07-18 (×6): 200 mg via ORAL
  Filled 2022-07-16 (×6): qty 2

## 2022-07-16 MED ORDER — NAPROXEN 375 MG PO TABS
375.0000 mg | ORAL_TABLET | Freq: Three times a day (TID) | ORAL | Status: DC | PRN
  Administered 2022-07-16: 375 mg via ORAL
  Filled 2022-07-16 (×3): qty 1

## 2022-07-16 MED ORDER — TIZANIDINE HCL 4 MG PO TABS
2.0000 mg | ORAL_TABLET | Freq: Once | ORAL | Status: AC
  Administered 2022-07-16: 2 mg via ORAL
  Filled 2022-07-16: qty 1

## 2022-07-16 MED ORDER — SODIUM CHLORIDE 0.9 % IV SOLN
2.0000 g | INTRAVENOUS | Status: DC
  Administered 2022-07-16 – 2022-07-18 (×3): 2 g via INTRAVENOUS
  Filled 2022-07-16 (×3): qty 20

## 2022-07-16 MED ORDER — LACTATED RINGERS IV SOLN: INTRAVENOUS | Status: DC

## 2022-07-16 NOTE — Progress Notes (Signed)
  Progress Note   Patient: Denise Hudson [id number] DOB: [birth year] DOA: 07/12/2022     2 DOS: the patient was seen and examined on 07/16/2022 at 11:50AM      Brief hospital course: Denise Hudson is a 62 y.o. F with hx kidney stones who presented with right flank pain, hematuria, nausea/vomiting.   CT showed multiple stones in the distal right ureter with right perinephric stranding.   3/16: Admitted on antibiotics 3/17: Urology consulted, underwent cysto and RIGHT stent placement 3/18: Persistent pain 3/19: Recurrent fever, cultures of blood redrawn  3/20: Still with intense pain, again fever, US renal obtained     Assessment and Plan: * Ureterolithiasis CT abdomen on admission showed multiple right ureteral stones, right kidney stranding.  Stent placement 3/17. Persistent fevers post placement, pain with movement Urine culture growing pansensitive E. coli - Continue Rocephin - Obtain renal ultrasound - Add Pyridium and naproxen    Pyelonephritis of right kidney See above  Normocytic anemia - Check retics, iron stores  Transaminitis Mild - Repeat LFTs  AKI (acute kidney injury) (HCC)-resolved as of 07/14/2022 - baseline creatinine ~ suspected normal - patient presents with increase in creat >0.3 mg/dL above baseline, creat increase >1.5x baseline presumed to have occurred within past 7 days PTA - creat 1.13 on admission but GFR also noted down to ~56. Unclear if underlying CKD but need to trend BMP; unable to view outpatient labwork -Creatinine has normalized with fluids          Subjective: No vomiting, but minimal appetite, still fever last night, has severe pain in the right side with any movement, CVA tenderness, and malaise.  No hematuria.     Physical Exam: BP (!) 153/75 (BP Location: Left Arm)   Pulse (!) 57   Temp 98.2 F (36.8 C) (Oral)   Resp 17   Ht 6' (1.829 m)   Wt 71.1 kg   LMP 09/23/2015   SpO2 100%   BMI 21.25 kg/m   Adult  female, lying in bed, appears weak and tired, half eaten lunch on the table RRR, no murmurs, no peripheral edema Respiratory rate normal, lungs clear without rales Abdomen with tenderness in the right side, guarding throughout, no rigidity or rebound CVA tenderness noted Attentive to conversation, affect blunted by pain, face symmetric, speech fluent, moves all extremities with symmetric strength and coordination generalized weakness, impaired by pain    Data Reviewed: Discussed with urology Sodium, potassium, creatinine normal LFTs slightly elevated White blood cell count normal Hemoglobin down to 9  Family Communication: None present    Disposition: Status is: Inpatient Patient has ongoing pain and nausea and fever, will require ongoing IV antibiotics for pyelonephritis        Author: Edwin Dada, MD 07/16/2022 2:12 PM  For on call review [email or web address].

## 2022-07-16 NOTE — Plan of Care (Signed)
  Problem: Coping: Goal: Level of anxiety will decrease Outcome: Progressing   Problem: Elimination: Goal: Will not experience complications related to bowel motility Outcome: Progressing Goal: Will not experience complications related to urinary retention Outcome: Progressing   Problem: Pain Managment: Goal: General experience of comfort will improve Outcome: Progressing   

## 2022-07-16 NOTE — Assessment & Plan Note (Signed)
Retics low, iron stores seem normal - Repeat with PCP in 4 weeks

## 2022-07-16 NOTE — Assessment & Plan Note (Signed)
Resolved

## 2022-07-17 DIAGNOSIS — D509 Iron deficiency anemia, unspecified: Secondary | ICD-10-CM | POA: Diagnosis not present

## 2022-07-17 DIAGNOSIS — N12 Tubulo-interstitial nephritis, not specified as acute or chronic: Secondary | ICD-10-CM | POA: Diagnosis not present

## 2022-07-17 DIAGNOSIS — N201 Calculus of ureter: Secondary | ICD-10-CM | POA: Diagnosis not present

## 2022-07-17 DIAGNOSIS — R7401 Elevation of levels of liver transaminase levels: Secondary | ICD-10-CM | POA: Diagnosis not present

## 2022-07-17 LAB — CBC WITH DIFFERENTIAL/PLATELET
Abs Immature Granulocytes: 0.11 10*3/uL — ABNORMAL HIGH (ref 0.00–0.07)
Basophils Absolute: 0.1 10*3/uL (ref 0.0–0.1)
Basophils Relative: 1 %
Eosinophils Absolute: 0.1 10*3/uL (ref 0.0–0.5)
Eosinophils Relative: 1 %
HCT: 29 % — ABNORMAL LOW (ref 36.0–46.0)
Hemoglobin: 9.4 g/dL — ABNORMAL LOW (ref 12.0–15.0)
Immature Granulocytes: 1 %
Lymphocytes Relative: 11 %
Lymphs Abs: 1.2 10*3/uL (ref 0.7–4.0)
MCH: 24.1 pg — ABNORMAL LOW (ref 26.0–34.0)
MCHC: 32.4 g/dL (ref 30.0–36.0)
MCV: 74.4 fL — ABNORMAL LOW (ref 80.0–100.0)
Monocytes Absolute: 1 10*3/uL (ref 0.1–1.0)
Monocytes Relative: 9 %
Neutro Abs: 8.2 10*3/uL — ABNORMAL HIGH (ref 1.7–7.7)
Neutrophils Relative %: 77 %
Platelets: 388 10*3/uL (ref 150–400)
RBC: 3.9 MIL/uL (ref 3.87–5.11)
RDW: 14.4 % (ref 11.5–15.5)
WBC: 10.7 10*3/uL — ABNORMAL HIGH (ref 4.0–10.5)
nRBC: 0 % (ref 0.0–0.2)

## 2022-07-17 LAB — COMPREHENSIVE METABOLIC PANEL
ALT: 40 U/L (ref 0–44)
AST: 27 U/L (ref 15–41)
Albumin: 2.1 g/dL — ABNORMAL LOW (ref 3.5–5.0)
Alkaline Phosphatase: 135 U/L — ABNORMAL HIGH (ref 38–126)
Anion gap: 8 (ref 5–15)
BUN: 14 mg/dL (ref 6–20)
CO2: 24 mmol/L (ref 22–32)
Calcium: 8.4 mg/dL — ABNORMAL LOW (ref 8.9–10.3)
Chloride: 106 mmol/L (ref 98–111)
Creatinine, Ser: 0.71 mg/dL (ref 0.44–1.00)
GFR, Estimated: >60 mL/min (ref >60)
Glucose, Bld: 102 mg/dL — ABNORMAL HIGH (ref 70–99)
Potassium: 3.5 mmol/L (ref 3.5–5.1)
Sodium: 138 mmol/L (ref 135–145)
Total Bilirubin: 1 mg/dL (ref 0.3–1.2)
Total Protein: 6.2 g/dL — ABNORMAL LOW (ref 6.5–8.1)

## 2022-07-17 LAB — IRON AND TIBC
Iron: 22 ug/dL — ABNORMAL LOW (ref 28–170)
Saturation Ratios: 12 % (ref 10.4–31.8)
TIBC: 182 ug/dL — ABNORMAL LOW (ref 250–450)
UIBC: 160 ug/dL

## 2022-07-17 LAB — MAGNESIUM: Magnesium: 1.7 mg/dL (ref 1.7–2.4)

## 2022-07-17 LAB — RETICULOCYTES
Immature Retic Fract: 28.6 % — ABNORMAL HIGH (ref 2.3–15.9)
RBC.: 3.9 MIL/uL (ref 3.87–5.11)
Retic Count, Absolute: 46.4 10*3/uL (ref 19.0–186.0)
Retic Ct Pct: 1.2 % (ref 0.4–3.1)

## 2022-07-17 LAB — FERRITIN: Ferritin: 261 ng/mL (ref 11–307)

## 2022-07-17 MED ORDER — HYDROMORPHONE HCL 1 MG/ML IJ SOLN
0.5000 mg | Freq: Once | INTRAMUSCULAR | Status: AC
  Administered 2022-07-17: 0.5 mg via INTRAVENOUS
  Filled 2022-07-17: qty 0.5

## 2022-07-17 MED ORDER — ENOXAPARIN SODIUM 40 MG/0.4ML IJ SOSY
40.0000 mg | PREFILLED_SYRINGE | Freq: Every day | INTRAMUSCULAR | Status: DC
  Administered 2022-07-17 – 2022-07-19 (×3): 40 mg via SUBCUTANEOUS
  Filled 2022-07-17 (×3): qty 0.4

## 2022-07-17 NOTE — Progress Notes (Signed)
  Progress Note   Patient: Denise Hudson [id number] DOB: [birth year] DOA: 07/12/2022     3 DOS: the patient was seen and examined on 07/17/2022 at 9:15AM      Brief hospital course: Denise Hudson is a 62 y.o. F with hx kidney stones who presented with right flank pain, hematuria, nausea/vomiting.   CT showed multiple stones in the distal right ureter with right perinephric stranding.   3/16: Admitted on antibiotics 3/17: Urology consulted, underwent cysto and RIGHT stent placement 3/18: Persistent pain 3/19: Recurrent fever, cultures of blood redrawn  3/20: Still with intense pain, again fever, US renal obtained     Assessment and Plan: * Ureterolithiasis CT abdomen on admission showed multiple right ureteral stones, right kidney stranding.  Stent placement 3/17. Renal US yesteerday showed expected mild hydronephrosis, known cyst, no abnormal findings.   AKI ruled out Still in considerable pain - Continue Rocephin - Continue Pyridium and naproxen    Pyelonephritis of right kidney See above  Microcytic anemia Retics low, iron stores seem normal - Repeat with PCP in 4 weeks  Transaminitis Resolved            Subjective: No fever, but still pain with any movement, no confusion, vomiting.     Physical Exam: BP (!) 182/76 (BP Location: Left Arm)   Pulse (!) 54   Temp 98.6 F (37 C) (Oral)   Resp 17   Ht 6' (1.829 m)   Wt 68.7 kg   LMP 09/23/2015   SpO2 99%   BMI 20.54 kg/m   General: Pt is alert, awake, in pain Cardiovascular: RRR, nl S1-S2, no murmurs appreciated.   No LE edema.   Respiratory: Normal respiratory rate and rhythm.  CTAB without rales or wheezes. Abdominal: Abdomen soft and non-tender.  No distension or HSM.   Neuro/Psych: Strength symmetric in upper and lower extremities.  Judgment and insight appear normal.   Data Reviewed: BMP normal LFTs resolved Blood counts shows anemia and mild leukocytosis Iron studies  normal      Disposition: Status is: Inpatient I feel the patient warrants  one additional day IV antibiotics for her pyelonephritis        Author: Edwin Dada, MD 07/17/2022 12:21 PM  For on call review [email or web address].

## 2022-07-18 ENCOUNTER — Inpatient Hospital Stay (HOSPITAL_COMMUNITY): Payer: BC Managed Care – PPO

## 2022-07-18 ENCOUNTER — Encounter (HOSPITAL_COMMUNITY): Payer: Self-pay | Admitting: Internal Medicine

## 2022-07-18 DIAGNOSIS — N201 Calculus of ureter: Secondary | ICD-10-CM | POA: Diagnosis not present

## 2022-07-18 LAB — CBC WITH DIFFERENTIAL/PLATELET
Abs Immature Granulocytes: 0.15 10*3/uL — ABNORMAL HIGH (ref 0.00–0.07)
Basophils Absolute: 0.1 10*3/uL (ref 0.0–0.1)
Basophils Relative: 1 %
Eosinophils Absolute: 0.1 10*3/uL (ref 0.0–0.5)
Eosinophils Relative: 1 %
HCT: 27.3 % — ABNORMAL LOW (ref 36.0–46.0)
Hemoglobin: 8.7 g/dL — ABNORMAL LOW (ref 12.0–15.0)
Immature Granulocytes: 2 %
Lymphocytes Relative: 11 %
Lymphs Abs: 1 10*3/uL (ref 0.7–4.0)
MCH: 23.7 pg — ABNORMAL LOW (ref 26.0–34.0)
MCHC: 31.9 g/dL (ref 30.0–36.0)
MCV: 74.4 fL — ABNORMAL LOW (ref 80.0–100.0)
Monocytes Absolute: 0.9 10*3/uL (ref 0.1–1.0)
Monocytes Relative: 10 %
Neutro Abs: 6.9 10*3/uL (ref 1.7–7.7)
Neutrophils Relative %: 75 %
Platelets: 388 10*3/uL (ref 150–400)
RBC: 3.67 MIL/uL — ABNORMAL LOW (ref 3.87–5.11)
RDW: 14.1 % (ref 11.5–15.5)
WBC: 9 10*3/uL (ref 4.0–10.5)
nRBC: 0 % (ref 0.0–0.2)

## 2022-07-18 LAB — CULTURE, BLOOD (ROUTINE X 2)
Culture: NO GROWTH
Culture: NO GROWTH

## 2022-07-18 LAB — MAGNESIUM: Magnesium: 1.9 mg/dL (ref 1.7–2.4)

## 2022-07-18 MED ORDER — SODIUM CHLORIDE (PF) 0.9 % IJ SOLN
INTRAMUSCULAR | Status: AC
  Filled 2022-07-18: qty 50

## 2022-07-18 MED ORDER — CEFADROXIL 500 MG PO CAPS: 1000.0000 mg | ORAL_CAPSULE | Freq: Two times a day (BID) | ORAL | Status: DC

## 2022-07-18 MED ORDER — CEFAZOLIN SODIUM-DEXTROSE 1-4 GM/50ML-% IV SOLN
1.0000 g | Freq: Three times a day (TID) | INTRAVENOUS | Status: DC
  Administered 2022-07-19: 1 g via INTRAVENOUS
  Filled 2022-07-18 (×2): qty 50

## 2022-07-18 MED ORDER — IOHEXOL 9 MG/ML PO SOLN
ORAL | Status: AC
  Administered 2022-07-18: 500 mL via ORAL
  Filled 2022-07-18: qty 1000

## 2022-07-18 MED ORDER — HYDROMORPHONE HCL 1 MG/ML IJ SOLN
0.5000 mg | Freq: Once | INTRAMUSCULAR | Status: AC
  Administered 2022-07-18: 0.5 mg via INTRAVENOUS

## 2022-07-18 MED ORDER — OXYCODONE-ACETAMINOPHEN 7.5-325 MG PO TABS
1.0000 | ORAL_TABLET | Freq: Four times a day (QID) | ORAL | Status: DC | PRN
  Administered 2022-07-18 – 2022-07-19 (×3): 1 via ORAL
  Filled 2022-07-18 (×3): qty 1

## 2022-07-18 MED ORDER — IOHEXOL 300 MG/ML  SOLN
100.0000 mL | Freq: Once | INTRAMUSCULAR | Status: AC | PRN
  Administered 2022-07-18: 100 mL via INTRAVENOUS

## 2022-07-18 MED ORDER — HYDROMORPHONE HCL 1 MG/ML IJ SOLN
INTRAMUSCULAR | Status: AC
  Filled 2022-07-18: qty 0.5

## 2022-07-18 MED ORDER — KETOROLAC TROMETHAMINE 30 MG/ML IJ SOLN
30.0000 mg | Freq: Three times a day (TID) | INTRAMUSCULAR | Status: DC
  Administered 2022-07-18 – 2022-07-19 (×3): 30 mg via INTRAVENOUS
  Filled 2022-07-18 (×3): qty 1

## 2022-07-18 MED ORDER — IOHEXOL 9 MG/ML PO SOLN
500.0000 mL | ORAL | Status: AC
  Administered 2022-07-18: 500 mL via ORAL

## 2022-07-18 NOTE — Progress Notes (Signed)
  Progress Note   Patient: Denise Hudson [id number] DOB: 1960-12-13 DOA: 07/12/2022     4 DOS: the patient was seen and examined on 07/18/2022 at 9:58AM and 2:15PM      Brief hospital course: Mrs. Osterkamp is a 62 y.o. F with hx kidney stones who presented with right flank pain, hematuria, nausea/vomiting.   CT showed multiple stones in the distal right ureter with right perinephric stranding.   3/16: Admitted on antibiotics 3/17: Urology consulted, underwent cysto and RIGHT stent placement 3/18: Persistent pain 3/19: Recurrent fever, cultures of blood redrawn  3/20: Still with intense pain, again fever, US renal unremarkable 3/22: Still with intense pain, again fever, CT abdomen and pelvis normal     Assessment and Plan: * Ureterolithiasis CT obtained again today, imaging again reviewed with Urology, all normal.  No intervention possible to allerviate pain - Start scheduled toradol - Titrate up oxycodone - Continue acetaminophen - Continue oxybutynin and Pyridium                  Subjective: Unrelenting pain, only relief is with IV Dilaudid.     Physical Exam: BP (!) 186/94 (BP Location: Right Arm)   Pulse 79   Temp (!) 100.8 F (38.2 C) (Oral)   Resp 18   Ht 6' (1.829 m)   Wt 68.7 kg   LMP 09/23/2015   SpO2 97%   BMI 20.54 kg/m   Adult female, sitting up in bed, painful, RRR, no murmurs, no peripheral edema Respiratory rate normal, lungs clear without rales or wheezes Abdomen less intense pain with palpation, throughout, more on the right side Attention normal, affect blunted by pain, judgment insight appear normal, oriented Gait shuffling due to pain/antalgic strength symmetric in upper and lower extremities bilaterally   Data Reviewed: White blood cell count normal, hemoglobin 8.7      Disposition: Status is: Inpatient Patient requires ongoing pain titration, inability to walk or eat due to pain, still favoring, will continue IV  antibiotics        Author: Edwin Dada, MD 07/18/2022 3:20 PM  For on call review [email or web address]

## 2022-07-19 ENCOUNTER — Other Ambulatory Visit (HOSPITAL_COMMUNITY): Payer: Self-pay

## 2022-07-19 DIAGNOSIS — N201 Calculus of ureter: Secondary | ICD-10-CM | POA: Diagnosis not present

## 2022-07-19 LAB — CBC WITH DIFFERENTIAL/PLATELET
Abs Immature Granulocytes: 0.15 10*3/uL — ABNORMAL HIGH (ref 0.00–0.07)
Basophils Absolute: 0.1 10*3/uL (ref 0.0–0.1)
Basophils Relative: 1 %
Eosinophils Absolute: 0.1 10*3/uL (ref 0.0–0.5)
Eosinophils Relative: 1 %
HCT: 31 % — ABNORMAL LOW (ref 36.0–46.0)
Hemoglobin: 9.8 g/dL — ABNORMAL LOW (ref 12.0–15.0)
Immature Granulocytes: 2 %
Lymphocytes Relative: 11 %
Lymphs Abs: 0.9 10*3/uL (ref 0.7–4.0)
MCH: 23.6 pg — ABNORMAL LOW (ref 26.0–34.0)
MCHC: 31.6 g/dL (ref 30.0–36.0)
MCV: 74.5 fL — ABNORMAL LOW (ref 80.0–100.0)
Monocytes Absolute: 0.7 10*3/uL (ref 0.1–1.0)
Monocytes Relative: 8 %
Neutro Abs: 6.2 10*3/uL (ref 1.7–7.7)
Neutrophils Relative %: 77 %
Platelets: 465 10*3/uL — ABNORMAL HIGH (ref 150–400)
RBC: 4.16 MIL/uL (ref 3.87–5.11)
RDW: 14.6 % (ref 11.5–15.5)
WBC: 8.1 10*3/uL (ref 4.0–10.5)
nRBC: 0 % (ref 0.0–0.2)

## 2022-07-19 LAB — MAGNESIUM: Magnesium: 2.1 mg/dL (ref 1.7–2.4)

## 2022-07-19 MED ORDER — AMLODIPINE BESYLATE 5 MG PO TABS
5.0000 mg | ORAL_TABLET | Freq: Every day | ORAL | Status: DC
  Administered 2022-07-19: 5 mg via ORAL
  Filled 2022-07-19: qty 1

## 2022-07-19 MED ORDER — OXYBUTYNIN CHLORIDE 5 MG PO TABS
5.0000 mg | ORAL_TABLET | Freq: Three times a day (TID) | ORAL | 0 refills | Status: DC
  Filled 2022-07-19: qty 42, 14d supply, fill #0

## 2022-07-19 MED ORDER — HYDRALAZINE HCL 20 MG/ML IJ SOLN
10.0000 mg | INTRAMUSCULAR | Status: DC | PRN
  Administered 2022-07-19: 10 mg via INTRAVENOUS
  Filled 2022-07-19: qty 1

## 2022-07-19 MED ORDER — AMLODIPINE BESYLATE 5 MG PO TABS
5.0000 mg | ORAL_TABLET | Freq: Every day | ORAL | 3 refills | Status: DC
  Filled 2022-07-19: qty 30, 30d supply, fill #0

## 2022-07-19 MED ORDER — HYDRALAZINE HCL 20 MG/ML IJ SOLN: 10.0000 mg | Freq: Four times a day (QID) | INTRAMUSCULAR | Status: DC | PRN

## 2022-07-19 MED ORDER — CEFADROXIL 500 MG PO CAPS
1.0000 g | ORAL_CAPSULE | Freq: Two times a day (BID) | ORAL | 0 refills | Status: AC
  Filled 2022-07-19: qty 28, 14d supply, fill #0
  Filled 2022-07-19: qty 56, 14d supply, fill #0

## 2022-07-19 MED ORDER — OXYCODONE-ACETAMINOPHEN 7.5-325 MG PO TABS
1.0000 | ORAL_TABLET | ORAL | 0 refills | Status: DC | PRN
  Filled 2022-07-19: qty 30, 5d supply, fill #0

## 2022-07-19 MED ORDER — HYDRALAZINE HCL 20 MG/ML IJ SOLN
10.0000 mg | Freq: Four times a day (QID) | INTRAMUSCULAR | Status: DC | PRN
  Filled 2022-07-19: qty 1

## 2022-07-19 MED ORDER — ONDANSETRON HCL 4 MG PO TABS
4.0000 mg | ORAL_TABLET | Freq: Four times a day (QID) | ORAL | 0 refills | Status: DC | PRN
  Filled 2022-07-19: qty 20, 5d supply, fill #0

## 2022-07-19 NOTE — Discharge Summary (Signed)
Physician Discharge Summary   Patient: Denise [place] MRN: [id number] DOB: 24-Feb-1961  Admit date:     07/12/2022  Discharge date: 07/19/22  Discharge Physician: Edwin Dada   PCP: Patient, No Pcp Per     Recommendations at discharge:  Follow up with Dr. Jackson Latino as soon as able for pyelonephritis with ureteral stone causing hydronephrosis  Establish with new PCP for hypertension New PCP: Please check Hgb and iron studies in 4-6 weeks Please adjust BP meds as needed     Discharge Diagnoses: Principal Problem:   Ureterolithiasis Active Problems:   Pyelonephritis of right kidney   Transaminitis   Microcytic anemia   Essential hypertension    Hospital Course: [name] is a 62 y.o. F with hx kidney stones who presented with right flank pain, hematuria, nausea/vomiting.   CT showed multiple stones in the distal right ureter with right perinephric stranding.      * Ureterolithiasis with ureteral obstruction requiring stent Pyelonephritis of right kidney CT abdomen on admission showed multiple right ureteral stones, right kidney stranding.  Urology consulted, stent placed 3/17.  Post-stenting, patient had more than expected pain and occasional fevers.    She underwent US renal that was unremarkable, then continued to have persistent unrelenting pain and so CT abdomen and pelvis with IV contrast obtained that showed no acute findings other than known stent, mild hydro (expected) and pyelonephritis (known).  I discussed with Dr. Felipa Eth and Dr. Abner Greenspan, who both felt her symptoms were within expected for her illnes and no further intevention was possible until her UTI had been treated and Dr. Gilford Rile could remove her stone in 1-2 weeks.  Analgesics maximized, discharged on 14 days cefadroxil for pan-sensitive E coli.      Microcytic anemia Retics low, iron stores seem normal - Repeat with PCP in 4 weeks  Transaminitis Due to pyelonephritis.  Resolved  AKI  ruled out  Hypertension BP in the chart going back to 2016 appears to be always stage II hypertension.  Here, likely exacerbated by pain, but given her long-standing history, she was started on amlodipine. - Needs PCP follow up          The [place] was reviewed for this patient prior to discharge.  Consultants: Urology Procedures performed:   Cystoscopy with right retrograde pyelogram and right ureteral stent placement    Disposition: Home Diet recommendation: Regular   DISCHARGE MEDICATION: Allergies as of 07/19/2022   No Known Allergies      Medication List     TAKE these medications    amLODipine 5 MG tablet Commonly known as: NORVASC Take 1 tablet (5 mg total) by mouth daily. Start taking on: July 20, 2022   cefadroxil 1 g tablet Commonly known as: DURICEF Take 1 tablet (1 g total) by mouth 2 (two) times daily for 14 days.   ondansetron 4 MG tablet Commonly known as: ZOFRAN Take 1 tablet (4 mg total) by mouth every 6 (six) hours as needed for nausea.   oxybutynin 5 MG tablet Commonly known as: DITROPAN Take 1 tablet (5 mg total) by mouth 3 (three) times daily.   oxyCODONE-acetaminophen 7.5-325 MG tablet Commonly known as: PERCOCET Take 1 tablet by mouth every 4 (four) hours as needed for moderate pain.        Follow-up Information     Ceasar Mons, MD. Schedule an appointment as soon as possible for a visit in 1 week(s).   Specialty: Urology Contact information:  [place]  [id number] [phone]                 Discharge Instructions     Discharge instructions   Complete by: As directed    **IMPORTANT DISCHARGE INSTRUCTIONS**   From Dr. Loleta Books: You were admitted for a kidney stone and kidney infection  You were treated with antibiotics and had a stent placed around the stone, which was blocking your kidney.  Go see Dr. Gilford Rile as soon as possible  to make arrangements to remove the stent and the stone.  In the meantime, take antibiotics and plan to take them for at least 2 weeks Take the antibiotic cefadroxil 1000 mg (two tabs) twice daily morning and night for 2 weeks  To prevent pain from ureter spasms, take oxybutynin three times daily  If you still have pain, take Percocet 7.5-325 mg as needed for pain  In addition to Percocet, you may take naproxen/Aleve 440 mg (two tabs) up to three times daily for the next 4 days  If you have nausea, you may take ondansetron/Zofran 4 mg up to every 8 hours   Lastly, your blood pressure was consistently high here.  I recommend you start amlodipine and it is very important you establish with a primary doctor as soon as possible   Increase activity slowly   Complete by: As directed        Discharge Exam: Filed Weights   07/14/22 0550 07/16/22 0616 07/17/22 0500  Weight: 67.2 kg 71.1 kg 68.7 kg    General: Pt is alert, awake, not in acute distress Cardiovascular: RRR, nl S1-S2, no murmurs appreciated.   No LE edema.   Respiratory: Normal respiratory rate and rhythm.  CTAB without rales or wheezes. Abdominal: Abdomen soft and non-tender.  No distension or HSM.   Neuro/Psych: Strength symmetric in upper and lower extremities.  Judgment and insight appear normal.   Condition at discharge: good  The results of significant diagnostics from this hospitalization (including imaging, microbiology, ancillary and laboratory) are listed below for reference.   Imaging Studies: CT ABDOMEN PELVIS W CONTRAST  Result Date: 07/18/2022 CLINICAL DATA:  History of right flank pain and hematuria, urinary calculi, stent placement EXAM: CT ABDOMEN AND PELVIS WITH CONTRAST TECHNIQUE: Multidetector CT imaging of the abdomen and pelvis was performed using the standard protocol following bolus administration of intravenous contrast. RADIATION DOSE REDUCTION: This exam was performed according to the  departmental dose-optimization program which includes automated exposure control, adjustment of the mA and/or kV according to patient size and/or use of iterative reconstruction technique. CONTRAST:  174mL OMNIPAQUE IOHEXOL 300 MG/ML  SOLN COMPARISON:  07/12/2022, 07/16/2022 FINDINGS: Lower chest: Trace bilateral pleural effusions and dependent lower lobe atelectasis. Hepatobiliary: No focal liver abnormality is seen. No gallstones, gallbladder wall thickening, or biliary dilatation. Pancreas: Unremarkable. No pancreatic ductal dilatation or surrounding inflammatory changes. Spleen: Normal in size without focal abnormality. Adrenals/Urinary Tract: Since the prior exam, a right ureteral stent has been placed, proximal aspect coiled at the right UPJ and distal aspect coiled within the bladder lumen. There is a persistent 7 mm calculus within the distal right ureter reference image 76/2. Two of the other distal right ureteral calculi seen on the previous exam have been displaced proximally, with an 8 mm calculus seen in the proximal right ureter reference image 46/2 and a 6 mm curvilinear calculus within the right renal pelvis reference image 37/2. There is persistent mild right-sided hydronephrosis. Striated right-sided nephrogram  and mucosal enhancement of the right renal pelvis consistent with superimposed infection and pyelonephritis. No evidence of renal abscess. Stable benign right renal cyst does not require follow-up. Stable appearance of the left kidney, with punctate nonobstructing left renal calculi identified. No obstruction. The adrenals and bladder are unremarkable. Stomach/Bowel: No bowel obstruction or ileus. No bowel wall thickening or inflammatory change. Vascular/Lymphatic: Stable aortic atherosclerosis. Subcentimeter retroperitoneal lymph nodes are stable. No pathologic adenopathy. Reproductive: Uterus and bilateral adnexa are unremarkable. Other: Trace free fluid within the pelvis. No free  intraperitoneal gas. Small fat containing right periumbilical hernia. Musculoskeletal: No acute or destructive bony lesions. Reconstructed images demonstrate no additional findings. IMPRESSION: 1. Interval placement of a right ureteral stent as above. 2 of the previously identified obstructing distal right ureteral calculi have migrated proximally, one within the right renal pelvis and a second within the proximal right ureter adjacent to the indwelling stent. The third calculus remains lodged within the distal right ureter just proximal to the UVJ. 2. Persistent mild right hydronephrosis, with abnormal striated nephrogram of the right kidney and right renal pelvis mucosal enhancement consistent with superimposed infection and pyelonephritis. No evidence of renal abscess. 3. Trace pelvic free fluid. 4. Stable punctate nonobstructing left renal calculi. 5. Trace bilateral pleural effusions with minimal dependent lower lobe atelectasis. 6.  Aortic Atherosclerosis (ICD10-I70.0). Electronically Signed   By: Randa Ngo M.D.   On: 07/18/2022 10:49   US RENAL  Result Date: 07/16/2022 CLINICAL DATA:  Hydronephrosis EXAM: RENAL / URINARY TRACT ULTRASOUND COMPLETE COMPARISON:  CT 07/12/2022 FINDINGS: Right Kidney: Renal measurements: 13.4 x 5.7 x 6.3 = volume: 250.3 mL. Heterogeneous echotexture. Mild perinephric fluid. Ectatic appearance of the renal pelvis and proximal ureter. Similar to the prior CT scan. Note is also made of an anechoic structure of the right kidney measuring 3.2 x 2.6 x 3.0 cm with smooth margins and through transmission consistent with a Bosniak 1 cyst. Left Kidney: Renal measurements: 12.8 x 6.5 x 7.2 = volume: 312.7 mL. No collecting system dilatation. Mild perinephric fluid Bladder: Bladder is mildly distended with fluid.  Stent in place. Other: None. IMPRESSION: Slight ectasia of the right renal pelvis. Distended bladder.  Stent in the bladder. Slightly heterogeneous right renal echotexture.  Electronically Signed   By: Jill Side M.D.   On: 07/16/2022 17:46   DG C-Arm 1-60 Min-No Report  Result Date: 07/13/2022 Fluoroscopy was utilized by the requesting physician.  No radiographic interpretation.   US Abdomen Limited RUQ (LIVER/GB)  Result Date: 07/12/2022 CLINICAL DATA:  Elevated LFTs EXAM: ULTRASOUND ABDOMEN LIMITED RIGHT UPPER QUADRANT COMPARISON:  CT today FINDINGS: Gallbladder: No gallstones or wall thickening visualized. No sonographic Murphy sign noted by sonographer. Common bile duct: Diameter: Normal caliber, 2 mm Liver: No focal lesion identified. Within normal limits in parenchymal echogenicity. Portal vein is patent on color Doppler imaging with normal direction of blood flow towards the liver. Other: None. IMPRESSION: Unremarkable study Electronically Signed   By: Rolm Baptise M.D.   On: 07/12/2022 19:19   CT ABDOMEN PELVIS W CONTRAST  Result Date: 07/12/2022 CLINICAL DATA:  Hematuria EXAM: CT ABDOMEN AND PELVIS WITH CONTRAST TECHNIQUE: Multidetector CT imaging of the abdomen and pelvis was performed using the standard protocol following bolus administration of intravenous contrast. RADIATION DOSE REDUCTION: This exam was performed according to the departmental dose-optimization program which includes automated exposure control, adjustment of the mA and/or kV according to patient size and/or use of iterative reconstruction technique. CONTRAST:  100 cc Omnipaque  300 intravenous COMPARISON:  CT 10/18/2019 FINDINGS: Lower chest: Lung bases demonstrate no acute airspace disease. Borderline to mild cardiomegaly with trace pericardial effusion. Hepatobiliary: No focal liver abnormality is seen. No gallstones, gallbladder wall thickening, or biliary dilatation. Pancreas: Unremarkable. No pancreatic ductal dilatation or surrounding inflammatory changes. Spleen: Subcentimeter hypodensities in the spleen too small to further characterize but probably benign Adrenals/Urinary Tract:  Adrenal glands are within normal limits. 3.6 cm cyst midpole right kidney, no imaging follow-up is recommended. Several punctate intrarenal stones on the left. Mild left renal pelvis dilatation with slight urothelial enhancement on delayed views. Minimal right hydronephrosis and slightly prominent right ureter, secondary to at least 3 stacked stones in the distal right ureter ovary 2.5 cm craniocaudal length. The largest stone measures 10 mm. There is right perinephric fat stranding. Delayed views demonstrate areas of heterogenous nephrogram on the right suspicious for pyelonephritis. Thick-walled appearance of right renal pelvis. Small focus of air in the bladder, correlate for recent instrumentation. No significant excretion of contrast on delayed views consistent with decreased renal function. Stomach/Bowel: The stomach is nonenlarged. No dilated small bowel. No acute bowel wall thickening. Vascular/Lymphatic: Moderate aortic atherosclerosis. No aneurysm. No suspicious lymph nodes. Reproductive: Uterus unremarkable.  No suspicious adnexal mass. Other: Clips or coils in the right pelvis. No free air. No significant free fluid. Small fat containing right periumbilical hernia. Musculoskeletal: No acute or suspicious osseous abnormality IMPRESSION: 1. Minimal right hydronephrosis and hydroureter, secondary to at least 3 stacked stones in the distal right ureter measuring up to 2.5 cm in craniocaudal length. The largest stone measures 10 mm. This is seen several cm proximal to the right UVJ. There is right perinephric fat stranding. Heterogeneous nephrogram of the right kidney on delayed views is suspect for pyelonephritis. Urothelial thickening of right renal pelvis and mild enhancement of left renal pelvis could be inflammatory or due to upper urinary tract infection. 2. Poor excretion of contrast from both kidneys on delayed views suggesting decreased renal function, correlate with appropriate laboratory values 3.  Multiple punctate left intrarenal stones. Small focus of air in the bladder, correlate for recent instrumentation. 4. Aortic atherosclerosis. Aortic Atherosclerosis (ICD10-I70.0). Electronically Signed   By: Donavan Foil M.D.   On: 07/12/2022 19:10    Microbiology: Results for orders placed or performed during the hospital encounter of 07/12/22  Urine Culture (for pregnant, neutropenic or urologic patients or patients with an indwelling urinary catheter)     Status: Abnormal   Collection Time: 07/12/22  6:30 PM   Specimen: Urine, Clean Catch  Result Value Ref Range Status   Specimen Description   Final    URINE, CLEAN CATCH Performed at [name], [place] [address], [place], [place] [address]    Special Requests   Final    NONE Performed at [facility], [place] [address] [place], [place] [address]    Culture >=100,000 COLONIES/mL ESCHERICHIA COLI (A)  Final   Report Status 07/15/2022 FINAL  Final   Organism ID, Bacteria ESCHERICHIA COLI (A)  Final      Susceptibility   Escherichia coli - MIC*    AMPICILLIN <=2 SENSITIVE Sensitive     CEFAZOLIN <=4 SENSITIVE Sensitive     CEFEPIME <=0.12 SENSITIVE Sensitive     CEFTRIAXONE <=0.25 SENSITIVE Sensitive     CIPROFLOXACIN <=0.25 SENSITIVE Sensitive     GENTAMICIN <=1 SENSITIVE Sensitive     IMIPENEM <=0.25 SENSITIVE Sensitive     NITROFURANTOIN <=16 SENSITIVE Sensitive  TRIMETH/SULFA <=20 SENSITIVE Sensitive     AMPICILLIN/SULBACTAM <=2 SENSITIVE Sensitive     PIP/TAZO <=4 SENSITIVE Sensitive     * >=100,000 COLONIES/mL ESCHERICHIA COLI  Blood culture (routine x 2)     Status: None   Collection Time: 07/12/22  9:11 PM   Specimen: BLOOD  Result Value Ref Range Status   Specimen Description   Final    BLOOD BLOOD RIGHT FOREARM Performed at [place] [address] [place], [place] [address]    Special Requests   Final    BOTTLES DRAWN AEROBIC AND ANAEROBIC Blood  Culture results may not be optimal due to an inadequate volume of blood received in culture bottles Performed at [place] [address], [place], [place] [address]    Culture   Final    NO GROWTH 5 DAYS Performed at [facility], [place] [address] [place], [place] [address]    Report Status 07/18/2022 FINAL  Final  Blood culture (routine x 2)     Status: None   Collection Time: 07/12/22  9:25 PM   Specimen: BLOOD  Result Value Ref Range Status   Specimen Description   Final    BLOOD BLOOD LEFT FOREARM Performed at [facility] [address] [place], [place] [address]    Special Requests   Final    BOTTLES DRAWN AEROBIC AND ANAEROBIC Blood Culture results may not be optimal due to an inadequate volume of blood received in culture bottles Performed at [facility] [address] [place], [place] [address]    Culture   Final    NO GROWTH 5 DAYS Performed at [facility], [place] [address], [place], [place] [address]    Report Status 07/18/2022 FINAL  Final  Surgical PCR screen     Status: None   Collection Time: 07/13/22  3:10 AM   Specimen: Nasal Mucosa; Nasal Swab  Result Value Ref Range Status   MRSA, PCR NEGATIVE NEGATIVE Final   Staphylococcus aureus NEGATIVE NEGATIVE Final    Comment: (NOTE) The Xpert SA Assay (FDA approved for NASAL specimens in patients 76 years of age and older), is one component of a comprehensive surveillance program. It is not intended to diagnose infection nor to guide or monitor treatment. Performed at [facility], [place] [address] [place], [place] [address]   Culture, blood (Routine X 2) w Reflex to ID Panel     Status: None (Preliminary result)   Collection Time: 07/15/22  8:58 AM   Specimen: BLOOD  Result Value Ref Range Status   Specimen Description   Final    BLOOD BLOOD RIGHT ARM AEROBIC BOTTLE ONLY Performed at [facility] [address], [place], [place] [address]    Special Requests   Final    BOTTLES DRAWN AEROBIC ONLY Blood Culture adequate volume Performed at [facility] [address], [place], [place] [address]    Culture   Final    NO GROWTH 4 DAYS Performed at [facility], [place] [address], [place],  [address]    Report Status PENDING  Incomplete  Culture, blood (Routine X 2) w Reflex to ID Panel     Status: None (Preliminary result)   Collection Time: 07/15/22  8:59 AM   Specimen: BLOOD  Result Value Ref Range Status   Specimen Description   Final    BLOOD BLOOD RIGHT ARM AEROBIC BOTTLE  ONLY Performed at [facility], [place] [address] [place], [place] [address]    Special Requests   Final    BOTTLES DRAWN AEROBIC ONLY Blood Culture results may not be optimal due to an excessive volume of blood received in culture bottles Performed at [facility] [address] [place], [place] [address]    Culture   Final    NO GROWTH 4 DAYS Performed at [facility], [place] [address] [place], [place] [address]    Report Status PENDING  Incomplete    Labs: CBC: Recent Labs  Lab 07/13/22 0447 07/16/22 0441 07/17/22 0456 07/18/22 0500 07/19/22 0438  WBC 10.5 10.5 10.7* 9.0 8.1  NEUTROABS 8.4* 8.3* 8.2* 6.9 6.2  HGB 10.9* 9.0* 9.4* 8.7* 9.8*  HCT 35.8* 28.4* 29.0* 27.3* 31.0*  MCV 77.5* 74.5* 74.4* 74.4* 74.5*  PLT 268 320 388 388 [id number]*   Basic Metabolic Panel: Recent Labs  Lab 07/12/22 1710 07/13/22 0447 07/16/22 0441 07/17/22 0456 07/18/22 0500 07/19/22 0438  NA 135 138 136 138  --   --   K 3.6 4.2 3.6 3.5  --   --   CL 99 104 104 106  --   --   CO2 23 25 25 24   --   --   GLUCOSE 114* 108* 102* 102*  --   --   BUN 34* 28* 17 14  --   --   CREATININE 1.13* 0.99 0.82 0.71  --   --   CALCIUM 9.0 8.6* 8.2* 8.4*  --   --   MG  --  2.1 1.9 1.7 1.9 2.1   Liver Function Tests: Recent Labs  Lab 07/12/22 1710  07/13/22 0447 07/17/22 0456  AST 78* 57* 27  ALT 93* 73* 40  ALKPHOS 182* 154* 135*  BILITOT 1.9* 1.2 1.0  PROT 8.2* 7.0 6.2*  ALBUMIN 3.1* 2.7* 2.1*   CBG: No results for input(s): "GLUCAP" in the last 168 hours.  Discharge time spent: approximately 35 minutes spent on discharge counseling, evaluation of patient on day of discharge, and coordination of discharge planning with nursing, social work, pharmacy and case management  Signed: Edwin Dada, MD [facility] 07/19/2022

## 2022-07-20 LAB — CULTURE, BLOOD (ROUTINE X 2)
Culture: NO GROWTH
Culture: NO GROWTH
Special Requests: ADEQUATE

## 2022-07-21 ENCOUNTER — Encounter (HOSPITAL_BASED_OUTPATIENT_CLINIC_OR_DEPARTMENT_OTHER): Payer: Self-pay | Admitting: Urology

## 2022-07-22 ENCOUNTER — Encounter (HOSPITAL_BASED_OUTPATIENT_CLINIC_OR_DEPARTMENT_OTHER): Payer: Self-pay | Admitting: Urology

## 2022-07-22 NOTE — Anesthesia Preprocedure Evaluation (Signed)
Anesthesia Evaluation  Patient identified by MRN, date of birth, ID band Patient awake    Reviewed: Allergy & Precautions, NPO status , Patient's Chart, lab work & pertinent test results  History of Anesthesia Complications Negative for: history of anesthetic complications  Airway Mallampati: II  TM Distance: >3 FB Neck ROM: Full    Dental  (+) Chipped, Poor Dentition   Pulmonary former smoker   Pulmonary exam normal        Cardiovascular hypertension, Pt. on medications Normal cardiovascular exam     Neuro/Psych negative neurological ROS  negative psych ROS   GI/Hepatic negative GI ROS, Neg liver ROS,,,  Endo/Other  negative endocrine ROS    Renal/GU negative Renal ROS     Musculoskeletal negative musculoskeletal ROS (+)    Abdominal   Peds  Hematology  (+) Blood dyscrasia, anemia   Anesthesia Other Findings   Reproductive/Obstetrics                              Anesthesia Physical Anesthesia Plan  ASA: 2  Anesthesia Plan: General   Post-op Pain Management: Tylenol PO (pre-op)*   Induction: Intravenous  PONV Risk Score and Plan: 3 and Treatment may vary due to age or medical condition, Ondansetron, Dexamethasone and Midazolam  Airway Management Planned: LMA  Additional Equipment: None  Intra-op Plan:   Post-operative Plan: Extubation in OR  Informed Consent: I have reviewed the patients History and Physical, chart, labs and discussed the procedure including the risks, benefits and alternatives for the proposed anesthesia with the patient or authorized representative who has indicated his/her understanding and acceptance.     Dental advisory given  Plan Discussed with: CRNA and Anesthesiologist  Anesthesia Plan Comments:          Anesthesia Quick Evaluation

## 2022-07-22 NOTE — Progress Notes (Signed)
Spoke w/ via phone for pre-op interview--- pt Lab needs dos----   ekg            Lab results------ current CBCdiff result dated 07-19-2022 in epic/ CMP result dated 07-17-2022 in epic COVID test -----patient states asymptomatic no test needed Arrive at ------- 0700 on 07-23-2022 NPO after MN NO Solid Food.  Clear liquids from MN until--- 0600 Med rec completed Medications to take morning of surgery ----- norvasc, may take pain/ nausea med if needed Diabetic medication ----- n/a Patient instructed no nail polish to be worn day of surgery Patient instructed to bring photo id and insurance card day of surgery Patient aware to have Driver (ride ) / caregiver    for 24 hours after surgery -- niece, nichole Patient Special Instructions ----- n/a Pre-Op special Istructions ----- n/a Patient verbalized understanding of instructions that were given at this phone interview. Patient denies shortness of breath, chest pain, fever, cough at this phone interview.

## 2022-07-23 ENCOUNTER — Ambulatory Visit (HOSPITAL_BASED_OUTPATIENT_CLINIC_OR_DEPARTMENT_OTHER): Payer: BC Managed Care – PPO | Admitting: Anesthesiology

## 2022-07-23 ENCOUNTER — Encounter (HOSPITAL_BASED_OUTPATIENT_CLINIC_OR_DEPARTMENT_OTHER): Payer: Self-pay | Admitting: Urology

## 2022-07-23 ENCOUNTER — Other Ambulatory Visit: Payer: Self-pay

## 2022-07-23 ENCOUNTER — Ambulatory Visit (HOSPITAL_BASED_OUTPATIENT_CLINIC_OR_DEPARTMENT_OTHER)
Admission: RE | Admit: 2022-07-23 | Discharge: 2022-07-23 | Disposition: A | Discharge status: Home / Self Care | Payer: BC Managed Care – PPO | Attending: Urology | Admitting: Urology

## 2022-07-23 ENCOUNTER — Encounter (HOSPITAL_BASED_OUTPATIENT_CLINIC_OR_DEPARTMENT_OTHER)
Admission: RE | Disposition: A | Discharge status: Home / Self Care | Payer: Self-pay | Source: Home / Self Care | Attending: Urology

## 2022-07-23 DIAGNOSIS — N12 Tubulo-interstitial nephritis, not specified as acute or chronic: Secondary | ICD-10-CM | POA: Diagnosis not present

## 2022-07-23 DIAGNOSIS — Z87891 Personal history of nicotine dependence: Secondary | ICD-10-CM | POA: Diagnosis not present

## 2022-07-23 DIAGNOSIS — N202 Calculus of kidney with calculus of ureter: Secondary | ICD-10-CM | POA: Insufficient documentation

## 2022-07-23 DIAGNOSIS — D649 Anemia, unspecified: Secondary | ICD-10-CM | POA: Insufficient documentation

## 2022-07-23 DIAGNOSIS — I10 Essential (primary) hypertension: Secondary | ICD-10-CM | POA: Diagnosis not present

## 2022-07-23 DIAGNOSIS — Z01818 Encounter for other preprocedural examination: Secondary | ICD-10-CM

## 2022-07-23 DIAGNOSIS — N132 Hydronephrosis with renal and ureteral calculous obstruction: Secondary | ICD-10-CM | POA: Diagnosis not present

## 2022-07-23 HISTORY — PX: CYSTOSCOPY/URETEROSCOPY/HOLMIUM LASER/STENT PLACEMENT: SHX6546

## 2022-07-23 HISTORY — DX: Constipation, unspecified: K59.00

## 2022-07-23 HISTORY — DX: Iron deficiency anemia, unspecified: D50.9

## 2022-07-23 SURGERY — CYSTOSCOPY/URETEROSCOPY/HOLMIUM LASER/STENT PLACEMENT
Anesthesia: General | Site: Ureter | Laterality: Right

## 2022-07-23 MED ORDER — LACTATED RINGERS IV SOLN
INTRAVENOUS | Status: DC
  Administered 2022-07-23: 1000 mL via INTRAVENOUS

## 2022-07-23 MED ORDER — LIDOCAINE 2% (20 MG/ML) 5 ML SYRINGE
INTRAMUSCULAR | Status: DC | PRN
  Administered 2022-07-23: 60 mg via INTRAVENOUS

## 2022-07-23 MED ORDER — MIDAZOLAM HCL 5 MG/5ML IJ SOLN
INTRAMUSCULAR | Status: DC | PRN
  Administered 2022-07-23: 2 mg via INTRAVENOUS

## 2022-07-23 MED ORDER — KETOROLAC TROMETHAMINE 30 MG/ML IJ SOLN
INTRAMUSCULAR | Status: DC | PRN
  Administered 2022-07-23: 30 mg via INTRAVENOUS

## 2022-07-23 MED ORDER — ACETAMINOPHEN 500 MG PO TABS
1000.0000 mg | ORAL_TABLET | Freq: Once | ORAL | Status: AC
  Administered 2022-07-23: 1000 mg via ORAL

## 2022-07-23 MED ORDER — DEXAMETHASONE SODIUM PHOSPHATE 10 MG/ML IJ SOLN
INTRAMUSCULAR | Status: DC | PRN
  Administered 2022-07-23: 10 mg via INTRAVENOUS

## 2022-07-23 MED ORDER — ACETAMINOPHEN 500 MG PO TABS
ORAL_TABLET | ORAL | Status: AC
  Filled 2022-07-23: qty 2

## 2022-07-23 MED ORDER — SODIUM CHLORIDE 0.9 % IR SOLN
Status: DC | PRN
  Administered 2022-07-23: 3000 mL
  Administered 2022-07-23: 500 mL

## 2022-07-23 MED ORDER — ONDANSETRON HCL 4 MG/2ML IJ SOLN
INTRAMUSCULAR | Status: AC
  Filled 2022-07-23: qty 2

## 2022-07-23 MED ORDER — MIDAZOLAM HCL 2 MG/2ML IJ SOLN
INTRAMUSCULAR | Status: AC
  Filled 2022-07-23: qty 2

## 2022-07-23 MED ORDER — PROPOFOL 10 MG/ML IV BOLUS
INTRAVENOUS | Status: AC
  Filled 2022-07-23: qty 20

## 2022-07-23 MED ORDER — CEFAZOLIN SODIUM-DEXTROSE 2-4 GM/100ML-% IV SOLN
INTRAVENOUS | Status: AC
  Filled 2022-07-23: qty 100

## 2022-07-23 MED ORDER — FENTANYL CITRATE (PF) 100 MCG/2ML IJ SOLN: 25.0000 ug | INTRAMUSCULAR | Status: DC | PRN

## 2022-07-23 MED ORDER — LIDOCAINE HCL (PF) 2 % IJ SOLN
INTRAMUSCULAR | Status: AC
  Filled 2022-07-23: qty 5

## 2022-07-23 MED ORDER — OXYCODONE HCL 5 MG PO TABS: 5.0000 mg | ORAL_TABLET | Freq: Once | ORAL | Status: DC | PRN

## 2022-07-23 MED ORDER — KETOROLAC TROMETHAMINE 30 MG/ML IJ SOLN
INTRAMUSCULAR | Status: AC
  Filled 2022-07-23: qty 1

## 2022-07-23 MED ORDER — FENTANYL CITRATE (PF) 100 MCG/2ML IJ SOLN
INTRAMUSCULAR | Status: AC
  Filled 2022-07-23: qty 2

## 2022-07-23 MED ORDER — PROMETHAZINE HCL 25 MG/ML IJ SOLN: 6.2500 mg | INTRAMUSCULAR | Status: DC | PRN

## 2022-07-23 MED ORDER — PROPOFOL 10 MG/ML IV BOLUS
INTRAVENOUS | Status: DC | PRN
  Administered 2022-07-23: 160 mg via INTRAVENOUS

## 2022-07-23 MED ORDER — ONDANSETRON HCL 4 MG/2ML IJ SOLN
INTRAMUSCULAR | Status: DC | PRN
  Administered 2022-07-23: 4 mg via INTRAVENOUS

## 2022-07-23 MED ORDER — DEXAMETHASONE SODIUM PHOSPHATE 10 MG/ML IJ SOLN
INTRAMUSCULAR | Status: AC
  Filled 2022-07-23: qty 1

## 2022-07-23 MED ORDER — OXYCODONE HCL 5 MG/5ML PO SOLN: 5.0000 mg | Freq: Once | ORAL | Status: DC | PRN

## 2022-07-23 MED ORDER — CEFAZOLIN SODIUM-DEXTROSE 2-4 GM/100ML-% IV SOLN
2.0000 g | INTRAVENOUS | Status: AC
  Administered 2022-07-23: 2 g via INTRAVENOUS

## 2022-07-23 MED ORDER — FENTANYL CITRATE (PF) 100 MCG/2ML IJ SOLN
INTRAMUSCULAR | Status: DC | PRN
  Administered 2022-07-23: 25 ug via INTRAVENOUS
  Administered 2022-07-23: 50 ug via INTRAVENOUS
  Administered 2022-07-23: 25 ug via INTRAVENOUS

## 2022-07-23 SURGICAL SUPPLY — 31 items
APL SKNCLS STERI-STRIP NONHPOA (GAUZE/BANDAGES/DRESSINGS)
BAG DRAIN URO-CYSTO SKYTR STRL (DRAIN) ×1 IMPLANT
BAG DRN UROCATH (DRAIN) ×1
BASKET STONE 1.7 NGAGE (UROLOGICAL SUPPLIES) IMPLANT
BASKET ZERO TIP NITINOL 2.4FR (BASKET) ×1 IMPLANT
BENZOIN TINCTURE PRP APPL 2/3 (GAUZE/BANDAGES/DRESSINGS) IMPLANT
BSKT STON RTRVL ZERO TP 2.4FR (BASKET) ×1
CATH URETERAL DUAL LUMEN 10F (MISCELLANEOUS) IMPLANT
CATH URETL OPEN 5X70 (CATHETERS) IMPLANT
CLOTH BEACON ORANGE TIMEOUT ST (SAFETY) ×1 IMPLANT
GLOVE BIO SURGEON STRL SZ7.5 (GLOVE) ×1 IMPLANT
GOWN STRL REUS W/TWL XL LVL3 (GOWN DISPOSABLE) ×1 IMPLANT
GUIDEWIRE STR DUAL SENSOR (WIRE) IMPLANT
GUIDEWIRE ZIPWRE .038 STRAIGHT (WIRE) ×1 IMPLANT
IV NS IRRIG 3000ML ARTHROMATIC (IV SOLUTION) ×2 IMPLANT
KIT TURNOVER CYSTO (KITS) ×1 IMPLANT
LASER FIB FLEXIVA PULSE ID 365 (Laser) IMPLANT
MANIFOLD NEPTUNE II (INSTRUMENTS) ×1 IMPLANT
NS IRRIG 500ML POUR BTL (IV SOLUTION) ×1 IMPLANT
PACK CYSTO (CUSTOM PROCEDURE TRAY) ×1 IMPLANT
SHEATH NAVIGATOR HD 12/14X36 (SHEATH) IMPLANT
SLEEVE SCD COMPRESS KNEE MED (STOCKING) ×1 IMPLANT
STENT URET 6FRX24 CONTOUR (STENTS) IMPLANT
STRIP CLOSURE SKIN 1/2X4 (GAUZE/BANDAGES/DRESSINGS) IMPLANT
SYR 10ML LL (SYRINGE) ×1 IMPLANT
SYR TOOMEY IRRIG 70ML (MISCELLANEOUS) ×1
SYRINGE TOOMEY IRRIG 70ML (MISCELLANEOUS) IMPLANT
TRACTIP FLEXIVA PULS ID 200XHI (Laser) IMPLANT
TRACTIP FLEXIVA PULSE ID 200 (Laser)
TUBE CONNECTING 12X1/4 (SUCTIONS) IMPLANT
TUBING UROLOGY SET (TUBING) ×1 IMPLANT

## 2022-07-23 NOTE — Discharge Instructions (Addendum)
Post Anesthesia Home Care Instructions  Activity: Get plenty of rest for the remainder of the day. A responsible individual must stay with you for 24 hours following the procedure.  For the next 24 hours, DO NOT: -Drive a car -Paediatric nurse -Drink alcoholic beverages -Take any medication unless instructed by your physician -Make any legal decisions or sign important papers.  Meals: Start with liquid foods such as gelatin or soup. Progress to regular foods as tolerated. Avoid greasy, spicy, heavy foods. If nausea and/or vomiting occur, drink only clear liquids until the nausea and/or vomiting subsides. Call your physician if vomiting continues.  Special Instructions/Symptoms: Your throat may feel dry or sore from the anesthesia or the breathing tube placed in your throat during surgery. If this causes discomfort, gargle with warm salt water. The discomfort should disappear within 24 hours.  [facility] [phone] Post Ureteroscopy With [facility]  Definitions:  Ureter: The duct that transports urine from the kidney to the bladder. Stent:   A plastic hollow tube that is placed into the ureter, from the kidney to the bladder to prevent the ureter from swelling shut.  GENERAL INSTRUCTIONS:  Despite the fact that no skin incisions were used, the area around the ureter and bladder is raw and irritated. The stent is a foreign body which will further irritate the bladder wall. This irritation is manifested by increased frequency of urination, both day and night, and by an increase in the urge to urinate. In some, the urge to urinate is present almost always. Sometimes the urge is strong enough that you may not be able to stop yourself from urinating. The only real cure is to remove the stent and then give time for the bladder wall to heal which can't be done until the danger of the ureter swelling shut has passed, which varies.  You may see some blood  in your urine while the stent is in place and a few days afterwards. Do not be alarmed, even if the urine was clear for a while. Get off your feet and drink lots of fluids until clearing occurs. If you start to pass clots or don't improve, call us.  DIET: You may return to your normal diet immediately. Because of the raw surface of your bladder, alcohol, spicy foods, acid type foods and drinks with caffeine may cause irritation or frequency and should be used in moderation. To keep your urine flowing freely and to avoid constipation, drink plenty of fluids during the day ( 8-10 glasses ). Tip: Avoid cranberry juice because it is very acidic.  ACTIVITY: Your physical activity doesn't need to be restricted. However, if you are very active, you may see some blood in your urine. We suggest that you reduce your activity under these circumstances until the bleeding has stopped.  BOWELS: It is important to keep your bowels regular during the postoperative period. Straining with bowel movements can cause bleeding. A bowel movement every other day is reasonable. Use a mild laxative if needed, such as Milk of Magnesia 2-3 tablespoons, or 2 Dulcolax tablets. Call if you continue to have problems. If you have been taking narcotics for pain, before, during or after your surgery, you may be constipated. Take a laxative if necessary.   MEDICATION: You should resume your pre-surgery medications unless told not to. In addition you will often be given an antibiotic to prevent infection. These should be taken as prescribed until the bottles are finished unless you are having an unusual  reaction to one of the drugs.  PROBLEMS YOU SHOULD REPORT TO [place]: Fevers over 100.5 Fahrenheit. Heavy bleeding, or clots ( See above notes about blood in urine ). Inability to urinate. Drug reactions ( hives, rash, nausea, vomiting, diarrhea ). Severe burning or pain with urination that is not improving.  FOLLOW-UP: You will need  a follow-up appointment to monitor your progress. Call for this appointment at the number listed above. Usually the first appointment will be about three to fourteen days after your surgery.  May take Tylenol beginning at 2 PM as needed for discomfort/soreness. May take Ibuprofen (Advil) beginning at 4:30 PM as needed for discomfort/soreness.  Bring small container with the stone fragment to your follow up appointment.

## 2022-07-23 NOTE — Transfer of Care (Signed)
Immediate Anesthesia Transfer of Care Note  Patient: Denise Hudson  Procedure(s) Performed: CYSTOSCOPY RIGHT URETEROSCOPY, HOLMIUM LASER LITHOTRIPSY, AND RIGHT URETERAL STENT PLACEMENT (Right: Ureter)  Patient Location: PACU  Anesthesia Type:General  Level of Consciousness: awake, alert , oriented, and patient cooperative  Airway & Oxygen Therapy: Patient Spontanous Breathing and Patient connected to nasal cannula oxygen  Post-op Assessment: Report given to RN, Post -op Vital signs reviewed and stable, and Patient moving all extremities X 4  Post vital signs: Reviewed and stable  Last Vitals:  Vitals Value Taken Time  BP 166/67 07/23/22 1034  Temp 36.8 C 07/23/22 1034  Pulse 63 07/23/22 1041  Resp 15 07/23/22 1041  SpO2 100 % 07/23/22 1041  Vitals shown include unvalidated device data.  Last Pain:  Vitals:   07/23/22 0747  TempSrc:   PainSc: 10-Worst pain ever      Patients Stated Pain Goal: 2 ([id number])  Complications: No notable events documented.

## 2022-07-23 NOTE — H&P (Signed)
Urology Preoperative H&P   Chief Complaint: Right ureteral stones  History of Present Illness: Denise Hudson is a 62 y.o. female with multiple obstructing right ureteral stones with concomitant pansensitive E. coli UTI requiring urgent stent placement on 07/13/2022 with Dr. Gloriann Loan.  The patient is here today for definitive stone treatment.  She reports stent related discomfort, but denies nausea/vomiting, fever/chills or significant gross hematuria.  She has a long history of kidney stones and has required surgical intervention in the past.    Past Medical History:  Diagnosis Date   Constipation    History of acute pyelonephritis 07/12/2022   secondary to uropathy obstruction due to kidney stones   History of kidney stones    Microcytic anemia 07/12/2022   Nephrolithiasis    left side calculi non-obstructive per CT 07-18-2022   Ureteral calculi 07/12/2022   right side   Wears contact lenses     Past Surgical History:  Procedure Laterality Date   APPENDECTOMY  age 53   [place] Right 02/10/2017   Procedure: CYSTOSCOPY WITH RIGHT STENT PLACEMENT;  Surgeon: Ceasar Mons, MD;  Location: [facility];  Service: Urology;  Laterality: Right;   CYSTOSCOPY/URETEROSCOPY/HOLMIUM LASER/STENT PLACEMENT Right 03/04/2017   Procedure: CYSTOSCOPY/URETEROSCOPY/HOLMIUM LASER/STENT PLACEMENT;  Surgeon: Ceasar Mons, MD;  Location: [facility];  Service: Urology;  Laterality: Right;  NEEDS 30 MIN FOR PROCEDURE   CYSTOSCOPY/URETEROSCOPY/HOLMIUM LASER/STENT PLACEMENT Right 07/13/2022   Procedure: CYSTOSCOPY RIGHT URETERAL STENT PLACEMENT;  Surgeon: Lucas Mallow, MD;  Location: [facility];  Service: Urology;  Laterality: Right;   EXTRACORPOREAL SHOCK WAVE LITHOTRIPSY  2008    Allergies: No Known Allergies  History reviewed. No pertinent family history.  Social History:  reports that she quit smoking about 8 years ago. Her smoking use included  cigarettes. She has never used smokeless tobacco. She reports that she does not drink alcohol and does not use drugs.  ROS: A complete review of systems was performed.  All systems are negative except for pertinent findings as noted.  Physical Exam:  Vital signs in last 24 hours: Temp:  [97.1 F (36.2 C)] 97.1 F (36.2 C) (03/27 0720) Pulse Rate:  [103] 103 (03/27 0720) Resp:  [18] 18 (03/27 0720) BP: (176)/(87) 176/87 (03/27 0720) SpO2:  [100 %] 100 % (03/27 0720) Weight:  [60.7 kg-68 kg] 60.7 kg (03/27 0720) Constitutional:  Alert and oriented, No acute distress Cardiovascular: Regular rate and rhythm, No JVD Respiratory: Normal respiratory effort, Lungs clear bilaterally GI: Abdomen is soft, nontender, nondistended, no abdominal masses GU: No CVA tenderness Lymphatic: No lymphadenopathy Neurologic: Grossly intact, no focal deficits Psychiatric: Normal mood and affect  Laboratory Data:  No results for input(s): "WBC", "HGB", "HCT", "PLT" in the last 72 hours.  No results for input(s): "NA", "K", "CL", "GLUCOSE", "BUN", "CALCIUM", "CREATININE" in the last 72 hours.  Invalid input(s): "CO3"   No results found for this or any previous visit (from the past 24 hour(s)). Recent Results (from the past 240 hour(s))  Culture, blood (Routine X 2) w Reflex to ID Panel     Status: None   Collection Time: 07/15/22  8:58 AM   Specimen: BLOOD  Result Value Ref Range Status   Specimen Description   Final    BLOOD BLOOD RIGHT ARM AEROBIC BOTTLE ONLY Performed at [facility], [place] [address], [place], [place] [address]    Special Requests   Final    BOTTLES DRAWN AEROBIC ONLY Blood  Culture adequate volume Performed at [place] [address] [place], [place] [address]    Culture   Final    NO GROWTH 5 DAYS Performed at [facility], [place] [address], [place], [place] [address]    Report Status 07/20/2022 FINAL  Final  Culture, blood  (Routine X 2) w Reflex to ID Panel     Status: None   Collection Time: 07/15/22  8:59 AM   Specimen: BLOOD  Result Value Ref Range Status   Specimen Description   Final    BLOOD BLOOD RIGHT ARM AEROBIC BOTTLE ONLY Performed at [place] [address] [place], [place] [address]    Special Requests   Final    BOTTLES DRAWN AEROBIC ONLY Blood Culture results may not be optimal due to an excessive volume of blood received in culture bottles Performed at [place] [address], [place], [place] [address]    Culture   Final    NO GROWTH 5 DAYS Performed at [facility], [place] [address] [place], [place] [address]    Report Status 07/20/2022 FINAL  Final    Renal Function: Recent Labs    07/17/22 0456  CREATININE 0.71   Estimated Creatinine Clearance: 71.7 mL/min (by C-G formula based on SCr of 0.71 mg/dL).  Radiologic Imaging: No results found.  I independently reviewed the above imaging studies.  Assessment and Plan Denise Hudson is a 62 y.o. female with multiple obstructing right ureteral stones  The risks, benefits and alternatives of cystoscopy with RIGHT ureteroscopy, laser lithotripsy and ureteral stent placement was discussed the patient.  Risks included, but are not limited to: bleeding, urinary tract infection, ureteral injury/avulsion, ureteral stricture formation, retained stone fragments, the possibility that multiple surgeries may be required to treat the stone(s), MI, stroke, PE and the inherent risks of general anesthesia.  The patient voices understanding and wishes to proceed.      Ellison Hughs, MD 07/23/2022, 8:03 AM  [facility] Pager: [phone]

## 2022-07-23 NOTE — Op Note (Signed)
Operative Note  Preoperative diagnosis:  1.  Multiple right renal and ureteral calculi  Postoperative diagnosis: 1.  Multiple right renal and ureteral calculi  Procedure(s): 1.  Cystoscopy with right ureteral stent exchange, right ureteroscopy and right holmium laser lithotripsy  Surgeon: Ellison Hughs, MD  Assistants:  None  Anesthesia:  General  Complications:  None  EBL: Less than 5 mL  Specimens: 1.  Previously placed right ureteral stent was removed intact, inspected and discarded 2.  Right ureteral stone fragments  Drains/Catheters: 1.  Right 6 French, 24 cm JJ stent without tether  Intraoperative findings:   Multiple large right distal ureteral calculi along with scattered nonobstructing right renal stone fragments  Indication:  Denise Hudson is a 62 y.o. female with multiple right ureteral and renal calculi.  She required urgent ureteral stent placement with Dr. Gloriann Loan on [id number] due to renal colic from her obstructing stones and a pansensitive E. coli UTI.  She is here today for definitive stone treatment.  She has been consented for the above procedures, voices understanding and wishes to proceed.  Description of procedure:  After informed consent was obtained, the patient was brought to the operating room and general LMA anesthesia was administered. The patient was then placed in the dorsolithotomy position and prepped and draped in the usual sterile fashion. A timeout was performed. A 23 French rigid cystoscope was then inserted into the urethral meatus and advanced into the bladder under direct vision. A complete bladder survey revealed no intravesical pathology.  Her previously placed right ureteral stent was grasped at its distal curl, removed intact and discarded.  A Glidewire was then advanced up the right ureter to the right renal pelvis, under fluoroscopic guidance.  A semirigid ureteroscope was then advanced into the distal aspects of her right ureter  where 2 large stones were identified.  A 200 m holmium laser was then used to fracture the stone into numerous smaller pieces, which were then swept out of the lumen of the ureter with a 0 tip basket.  A flexible ureteroscope was then advanced up the right ureter to the right renal pelvis where multiple small stone fragments were identified.  The 200 m holmium laser was then used to dust the stone fragments into 2 mm or less pieces.  The flexible ureteroscope was then removed, identifying no evidence of ureteral trauma or significant luminal stone burden.  A new 6 [place], 24 cm JJ stent was then advanced over the wire and into good position within the right collecting system, confirming placement via fluoroscopy.  Plan: Follow-up in 1 week for office cystoscopy and stent removal

## 2022-07-23 NOTE — Anesthesia Postprocedure Evaluation (Signed)
Anesthesia Post Note  Patient: Denise Hudson  Procedure(s) Performed: CYSTOSCOPY RIGHT URETEROSCOPY, HOLMIUM LASER LITHOTRIPSY, AND RIGHT URETERAL STENT PLACEMENT (Right: Ureter)     Patient location during evaluation: PACU Anesthesia Type: General Level of consciousness: awake and alert Pain management: pain level controlled Vital Signs Assessment: post-procedure vital signs reviewed and stable Respiratory status: spontaneous breathing, nonlabored ventilation and respiratory function stable Cardiovascular status: stable and blood pressure returned to baseline Anesthetic complications: no   No notable events documented.  Last Vitals:  Vitals:   07/23/22 1115 07/23/22 1140  BP: (!) 168/75 (!) 171/80  Pulse: (!) 56 (!) 55  Resp: 12 14  Temp:  (!) 36.3 C  SpO2: 98% 95%    Last Pain:  Vitals:   07/23/22 1140  TempSrc:   PainSc: 0-No pain                 Audry Pili

## 2022-07-23 NOTE — Anesthesia Procedure Notes (Signed)
Procedure Name: LMA Insertion Date/Time: 07/23/2022 9:21 AM  Performed by: Zechariah Bissonnette D, CRNAPre-anesthesia Checklist: Patient identified, Emergency Drugs available, Suction available and Patient being monitored Patient Re-evaluated:Patient Re-evaluated prior to induction Oxygen Delivery Method: Circle system utilized Preoxygenation: Pre-oxygenation with 100% oxygen Induction Type: IV induction Ventilation: Mask ventilation without difficulty LMA: LMA inserted LMA Size: 4.0 Tube type: Oral Number of attempts: 1 Placement Confirmation: positive ETCO2 and breath sounds checked- equal and bilateral Tube secured with: Tape Dental Injury: Teeth and Oropharynx as per pre-operative assessment

## 2022-07-24 ENCOUNTER — Encounter (HOSPITAL_BASED_OUTPATIENT_CLINIC_OR_DEPARTMENT_OTHER): Payer: Self-pay | Admitting: Urology

## 2022-07-30 DIAGNOSIS — R8271 Bacteriuria: Secondary | ICD-10-CM | POA: Diagnosis not present

## 2022-07-30 DIAGNOSIS — N202 Calculus of kidney with calculus of ureter: Secondary | ICD-10-CM | POA: Diagnosis not present

## 2022-08-09 ENCOUNTER — Encounter (HOSPITAL_COMMUNITY): Payer: Self-pay

## 2022-08-09 ENCOUNTER — Inpatient Hospital Stay (HOSPITAL_COMMUNITY)
Admission: EM | Admit: 2022-08-09 | Discharge: 2022-08-18 | DRG: 539 | Disposition: A | Discharge status: Home Health | Payer: BC Managed Care – PPO | Attending: Family Medicine | Admitting: Family Medicine

## 2022-08-09 ENCOUNTER — Other Ambulatory Visit: Payer: Self-pay

## 2022-08-09 DIAGNOSIS — M4644 Discitis, unspecified, thoracic region: Secondary | ICD-10-CM | POA: Diagnosis present

## 2022-08-09 DIAGNOSIS — Z87442 Personal history of urinary calculi: Secondary | ICD-10-CM

## 2022-08-09 DIAGNOSIS — E871 Hypo-osmolality and hyponatremia: Secondary | ICD-10-CM | POA: Diagnosis not present

## 2022-08-09 DIAGNOSIS — D509 Iron deficiency anemia, unspecified: Secondary | ICD-10-CM | POA: Diagnosis present

## 2022-08-09 DIAGNOSIS — R001 Bradycardia, unspecified: Secondary | ICD-10-CM | POA: Diagnosis present

## 2022-08-09 DIAGNOSIS — Z681 Body mass index (BMI) 19 or less, adult: Secondary | ICD-10-CM

## 2022-08-09 DIAGNOSIS — M4647 Discitis, unspecified, lumbosacral region: Secondary | ICD-10-CM | POA: Diagnosis not present

## 2022-08-09 DIAGNOSIS — R7881 Bacteremia: Secondary | ICD-10-CM | POA: Diagnosis present

## 2022-08-09 DIAGNOSIS — M4626 Osteomyelitis of vertebra, lumbar region: Secondary | ICD-10-CM | POA: Diagnosis not present

## 2022-08-09 DIAGNOSIS — Z79899 Other long term (current) drug therapy: Secondary | ICD-10-CM

## 2022-08-09 DIAGNOSIS — R636 Underweight: Secondary | ICD-10-CM | POA: Insufficient documentation

## 2022-08-09 DIAGNOSIS — R911 Solitary pulmonary nodule: Secondary | ICD-10-CM | POA: Diagnosis not present

## 2022-08-09 DIAGNOSIS — M545 Low back pain, unspecified: Secondary | ICD-10-CM | POA: Diagnosis not present

## 2022-08-09 DIAGNOSIS — I10 Essential (primary) hypertension: Secondary | ICD-10-CM | POA: Diagnosis not present

## 2022-08-09 DIAGNOSIS — M549 Dorsalgia, unspecified: Secondary | ICD-10-CM | POA: Diagnosis not present

## 2022-08-09 DIAGNOSIS — B9620 Unspecified Escherichia coli [E. coli] as the cause of diseases classified elsewhere: Secondary | ICD-10-CM | POA: Diagnosis present

## 2022-08-09 DIAGNOSIS — E876 Hypokalemia: Secondary | ICD-10-CM | POA: Diagnosis not present

## 2022-08-09 DIAGNOSIS — N12 Tubulo-interstitial nephritis, not specified as acute or chronic: Secondary | ICD-10-CM | POA: Diagnosis present

## 2022-08-09 DIAGNOSIS — M4627 Osteomyelitis of vertebra, lumbosacral region: Secondary | ICD-10-CM | POA: Diagnosis not present

## 2022-08-09 DIAGNOSIS — M4646 Discitis, unspecified, lumbar region: Secondary | ICD-10-CM | POA: Diagnosis present

## 2022-08-09 DIAGNOSIS — T465X6A Underdosing of other antihypertensive drugs, initial encounter: Secondary | ICD-10-CM | POA: Diagnosis present

## 2022-08-09 DIAGNOSIS — N201 Calculus of ureter: Secondary | ICD-10-CM | POA: Diagnosis not present

## 2022-08-09 DIAGNOSIS — G061 Intraspinal abscess and granuloma: Secondary | ICD-10-CM | POA: Diagnosis not present

## 2022-08-09 DIAGNOSIS — Z87891 Personal history of nicotine dependence: Secondary | ICD-10-CM | POA: Diagnosis not present

## 2022-08-09 DIAGNOSIS — E43 Unspecified severe protein-calorie malnutrition: Secondary | ICD-10-CM | POA: Diagnosis not present

## 2022-08-09 DIAGNOSIS — K5900 Constipation, unspecified: Secondary | ICD-10-CM | POA: Diagnosis not present

## 2022-08-09 DIAGNOSIS — N281 Cyst of kidney, acquired: Secondary | ICD-10-CM | POA: Diagnosis not present

## 2022-08-09 DIAGNOSIS — R109 Unspecified abdominal pain: Secondary | ICD-10-CM | POA: Diagnosis not present

## 2022-08-09 DIAGNOSIS — N200 Calculus of kidney: Secondary | ICD-10-CM | POA: Diagnosis not present

## 2022-08-09 LAB — URINALYSIS, ROUTINE W REFLEX MICROSCOPIC
Bilirubin Urine: NEGATIVE
Glucose, UA: NEGATIVE mg/dL
Ketones, ur: NEGATIVE mg/dL
Nitrite: NEGATIVE
Protein, ur: 30 mg/dL — AB
Specific Gravity, Urine: 1.018 (ref 1.005–1.030)
WBC, UA: >50 WBC/hpf (ref 0–5)
pH: 5 (ref 5.0–8.0)

## 2022-08-09 LAB — CBC WITH DIFFERENTIAL/PLATELET
Abs Immature Granulocytes: 0.1 10*3/uL — ABNORMAL HIGH (ref 0.00–0.07)
Basophils Absolute: 0.1 10*3/uL (ref 0.0–0.1)
Basophils Relative: 1 %
Eosinophils Absolute: 0 10*3/uL (ref 0.0–0.5)
Eosinophils Relative: 0 %
HCT: 28.4 % — ABNORMAL LOW (ref 36.0–46.0)
Hemoglobin: 8.5 g/dL — ABNORMAL LOW (ref 12.0–15.0)
Immature Granulocytes: 1 %
Lymphocytes Relative: 14 %
Lymphs Abs: 1.4 10*3/uL (ref 0.7–4.0)
MCH: 22.4 pg — ABNORMAL LOW (ref 26.0–34.0)
MCHC: 29.9 g/dL — ABNORMAL LOW (ref 30.0–36.0)
MCV: 74.7 fL — ABNORMAL LOW (ref 80.0–100.0)
Monocytes Absolute: 0.8 10*3/uL (ref 0.1–1.0)
Monocytes Relative: 9 %
Neutro Abs: 7.4 10*3/uL (ref 1.7–7.7)
Neutrophils Relative %: 75 %
Platelets: 384 10*3/uL (ref 150–400)
RBC: 3.8 MIL/uL — ABNORMAL LOW (ref 3.87–5.11)
RDW: 15.6 % — ABNORMAL HIGH (ref 11.5–15.5)
WBC: 9.8 10*3/uL (ref 4.0–10.5)
nRBC: 0 % (ref 0.0–0.2)

## 2022-08-09 LAB — COMPREHENSIVE METABOLIC PANEL
ALT: 18 U/L (ref 0–44)
AST: 17 U/L (ref 15–41)
Albumin: 2.7 g/dL — ABNORMAL LOW (ref 3.5–5.0)
Alkaline Phosphatase: 161 U/L — ABNORMAL HIGH (ref 38–126)
Anion gap: 9 (ref 5–15)
BUN: 17 mg/dL (ref 6–20)
CO2: 23 mmol/L (ref 22–32)
Calcium: 8.8 mg/dL — ABNORMAL LOW (ref 8.9–10.3)
Chloride: 101 mmol/L (ref 98–111)
Creatinine, Ser: 0.94 mg/dL (ref 0.44–1.00)
GFR, Estimated: >60 mL/min (ref >60)
Glucose, Bld: 126 mg/dL — ABNORMAL HIGH (ref 70–99)
Potassium: 3 mmol/L — ABNORMAL LOW (ref 3.5–5.1)
Sodium: 133 mmol/L — ABNORMAL LOW (ref 135–145)
Total Bilirubin: 0.6 mg/dL (ref 0.3–1.2)
Total Protein: 8.2 g/dL — ABNORMAL HIGH (ref 6.5–8.1)

## 2022-08-09 LAB — LIPASE, BLOOD: Lipase: 25 U/L (ref 11–51)

## 2022-08-09 MED ORDER — POTASSIUM CHLORIDE CRYS ER 20 MEQ PO TBCR
40.0000 meq | EXTENDED_RELEASE_TABLET | Freq: Once | ORAL | Status: AC
  Administered 2022-08-10: 40 meq via ORAL
  Filled 2022-08-09: qty 2

## 2022-08-09 MED ORDER — LACTATED RINGERS IV BOLUS
1000.0000 mL | Freq: Once | INTRAVENOUS | Status: AC
  Administered 2022-08-10: 1000 mL via INTRAVENOUS

## 2022-08-09 MED ORDER — ONDANSETRON HCL 4 MG/2ML IJ SOLN
4.0000 mg | Freq: Once | INTRAMUSCULAR | Status: AC
  Administered 2022-08-10: 4 mg via INTRAVENOUS
  Filled 2022-08-09: qty 2

## 2022-08-09 MED ORDER — FENTANYL CITRATE PF 50 MCG/ML IJ SOSY
50.0000 ug | PREFILLED_SYRINGE | Freq: Once | INTRAMUSCULAR | Status: AC
  Administered 2022-08-10: 50 ug via INTRAVENOUS
  Filled 2022-08-09: qty 1

## 2022-08-09 NOTE — ED Triage Notes (Addendum)
Pt. Arrives POV c/o abdominal pain that radiates to the back, pelvis and legs. Pt. States that she had surgery about a month ago to have a stent placed in her kidney and stones removed. She thinks her pain is associated with her surgery.  Pt. States that her pain is so bad that it makes her short of breath.

## 2022-08-09 NOTE — ED Provider Notes (Signed)
[place] EMERGENCY DEPARTMENT AT [facility] Provider Note   CSN: [id number] Arrival date & time: 08/09/22  2103     History {Add pertinent medical, surgical, social history, OB history to HPI:1} Chief Complaint  Patient presents with   Abdominal Pain   Back Pain    Denise Hudson is a 62 y.o. female.  The history is provided by the patient and medical records.  Abdominal Pain Back Pain Associated symptoms: abdominal pain   Denise Hudson is a 62 y.o. female who presents to the Emergency Department complaining of *** Pain Kidney stone surgery a few weeks ago. Stent pulled two weeks ago.  Pain in [place] back, slight on left.  Pain is spasm, comes and goes. Radiates to abdomen  Had chills today 101. Started yesterday.  Has vomiting - small amount. No dysuria.       Home Medications Prior to Admission medications   Medication Sig Start Date End Date Taking? Authorizing Provider  amLODipine (NORVASC) 5 MG tablet Take 1 tablet (5 mg total) by mouth daily. 07/20/22   Danford, Earl Lites, MD  ondansetron (ZOFRAN) 4 MG tablet Take 1 tablet (4 mg total) by mouth every 6 (six) hours as needed for nausea. 07/19/22   Danford, Earl Lites, MD  oxybutynin (DITROPAN) 5 MG tablet Take 1 tablet (5 mg total) by mouth 3 (three) times daily. 07/19/22   Danford, Earl Lites, MD  oxyCODONE-acetaminophen (PERCOCET) 7.5-325 MG tablet Take 1 tablet by mouth every 4 (four) hours as needed for moderate pain. 07/19/22   Danford, Earl Lites, MD      Allergies    Patient has no known allergies.    Review of Systems   Review of Systems  Gastrointestinal:  Positive for abdominal pain.  Musculoskeletal:  Positive for back pain.  All other systems reviewed and are negative.   Physical Exam Updated Vital Signs BP (!) 138/94   Pulse (!) 119   Temp 98.3 F (36.8 C) (Oral)   Resp 16   Ht 6' (1.829 m)   Wt 59 kg   LMP 09/23/2015   SpO2 100%   BMI 17.63 kg/m  Physical  Exam Vitals and nursing note reviewed.  Constitutional:      Appearance: She is well-developed.  HENT:     Head: Normocephalic and atraumatic.  Cardiovascular:     Rate and Rhythm: Normal rate and regular rhythm.     Heart sounds: No murmur heard. Pulmonary:     Effort: Pulmonary effort is normal. No respiratory distress.     Breath sounds: Normal breath sounds.  Abdominal:     Palpations: Abdomen is soft.     Tenderness: There is no guarding or rebound.     Comments: Moderate lower abdominal tenderness, greatest in the LLQ  Musculoskeletal:        General: No tenderness.  Skin:    General: Skin is warm and dry.  Neurological:     Mental Status: She is alert and oriented to person, place, and time.  Psychiatric:        Behavior: Behavior normal.     ED Results / Procedures / Treatments   Labs (all labs ordered are listed, but only abnormal results are displayed) Labs Reviewed  COMPREHENSIVE METABOLIC PANEL - Abnormal; Notable for the following components:      Result Value   Sodium 133 (*)    Potassium 3.0 (*)    Glucose, Bld 126 (*)    Calcium 8.8 (*)  Total Protein 8.2 (*)    Albumin 2.7 (*)    Alkaline Phosphatase 161 (*)    All other components within normal limits  CBC WITH DIFFERENTIAL/PLATELET - Abnormal; Notable for the following components:   RBC 3.80 (*)    Hemoglobin 8.5 (*)    HCT 28.4 (*)    MCV 74.7 (*)    MCH 22.4 (*)    MCHC 29.9 (*)    RDW 15.6 (*)    Abs Immature Granulocytes 0.10 (*)    All other components within normal limits  URINALYSIS, ROUTINE W REFLEX MICROSCOPIC - Abnormal; Notable for the following components:   APPearance HAZY (*)    Hgb urine dipstick SMALL (*)    Protein, ur 30 (*)    Leukocytes,Ua LARGE (*)    Bacteria, UA RARE (*)    All other components within normal limits  LIPASE, BLOOD    EKG None  Radiology No results found.  Procedures Procedures  {Document cardiac monitor, telemetry assessment procedure when  appropriate:1}  Medications Ordered in ED Medications - No data to display  ED Course/ Medical Decision Making/ A&P   {   Click here for ABCD2, HEART and other calculatorsREFRESH Note before signing :1}                          Medical Decision Making Amount and/or Complexity of Data Reviewed Labs: ordered.   ***  {Document critical care time when appropriate:1} {Document review of labs and clinical decision tools ie heart score, Chads2Vasc2 etc:1}  {Document your independent review of radiology images, and any outside records:1} {Document your discussion with family members, caretakers, and with consultants:1} {Document social determinants of health affecting pt's care:1} {Document your decision making why or why not admission, treatments were needed:1} Final Clinical Impression(s) / ED Diagnoses Final diagnoses:  None    Rx / DC Orders ED Discharge Orders     None

## 2022-08-10 ENCOUNTER — Emergency Department (HOSPITAL_COMMUNITY): Payer: BC Managed Care – PPO

## 2022-08-10 DIAGNOSIS — Z87891 Personal history of nicotine dependence: Secondary | ICD-10-CM | POA: Diagnosis not present

## 2022-08-10 DIAGNOSIS — M4647 Discitis, unspecified, lumbosacral region: Secondary | ICD-10-CM | POA: Diagnosis present

## 2022-08-10 DIAGNOSIS — M4646 Discitis, unspecified, lumbar region: Secondary | ICD-10-CM

## 2022-08-10 DIAGNOSIS — Z79899 Other long term (current) drug therapy: Secondary | ICD-10-CM | POA: Diagnosis not present

## 2022-08-10 DIAGNOSIS — E876 Hypokalemia: Secondary | ICD-10-CM | POA: Diagnosis present

## 2022-08-10 DIAGNOSIS — B9620 Unspecified Escherichia coli [E. coli] as the cause of diseases classified elsewhere: Secondary | ICD-10-CM | POA: Diagnosis present

## 2022-08-10 DIAGNOSIS — G061 Intraspinal abscess and granuloma: Secondary | ICD-10-CM | POA: Diagnosis present

## 2022-08-10 DIAGNOSIS — M4627 Osteomyelitis of vertebra, lumbosacral region: Secondary | ICD-10-CM | POA: Diagnosis present

## 2022-08-10 DIAGNOSIS — Z87442 Personal history of urinary calculi: Secondary | ICD-10-CM | POA: Diagnosis not present

## 2022-08-10 DIAGNOSIS — E43 Unspecified severe protein-calorie malnutrition: Secondary | ICD-10-CM | POA: Diagnosis present

## 2022-08-10 DIAGNOSIS — Z681 Body mass index (BMI) 19 or less, adult: Secondary | ICD-10-CM | POA: Diagnosis not present

## 2022-08-10 DIAGNOSIS — K5900 Constipation, unspecified: Secondary | ICD-10-CM | POA: Diagnosis not present

## 2022-08-10 DIAGNOSIS — E871 Hypo-osmolality and hyponatremia: Secondary | ICD-10-CM | POA: Diagnosis present

## 2022-08-10 DIAGNOSIS — N12 Tubulo-interstitial nephritis, not specified as acute or chronic: Secondary | ICD-10-CM | POA: Diagnosis present

## 2022-08-10 DIAGNOSIS — T465X6A Underdosing of other antihypertensive drugs, initial encounter: Secondary | ICD-10-CM | POA: Diagnosis present

## 2022-08-10 DIAGNOSIS — M4644 Discitis, unspecified, thoracic region: Secondary | ICD-10-CM | POA: Diagnosis present

## 2022-08-10 DIAGNOSIS — R7881 Bacteremia: Secondary | ICD-10-CM | POA: Diagnosis present

## 2022-08-10 DIAGNOSIS — D509 Iron deficiency anemia, unspecified: Secondary | ICD-10-CM | POA: Diagnosis present

## 2022-08-10 DIAGNOSIS — I10 Essential (primary) hypertension: Secondary | ICD-10-CM | POA: Diagnosis present

## 2022-08-10 DIAGNOSIS — N201 Calculus of ureter: Secondary | ICD-10-CM | POA: Diagnosis not present

## 2022-08-10 DIAGNOSIS — R911 Solitary pulmonary nodule: Secondary | ICD-10-CM | POA: Diagnosis present

## 2022-08-10 DIAGNOSIS — R001 Bradycardia, unspecified: Secondary | ICD-10-CM | POA: Diagnosis present

## 2022-08-10 LAB — BLOOD CULTURE ID PANEL (REFLEXED) - BCID2

## 2022-08-10 LAB — CBC
HCT: 24.9 % — ABNORMAL LOW (ref 36.0–46.0)
Hemoglobin: 7.4 g/dL — ABNORMAL LOW (ref 12.0–15.0)
MCH: 22.2 pg — ABNORMAL LOW (ref 26.0–34.0)
MCHC: 29.7 g/dL — ABNORMAL LOW (ref 30.0–36.0)
MCV: 74.6 fL — ABNORMAL LOW (ref 80.0–100.0)
Platelets: 346 10*3/uL (ref 150–400)
RBC: 3.34 MIL/uL — ABNORMAL LOW (ref 3.87–5.11)
RDW: 15.5 % (ref 11.5–15.5)
WBC: 11.8 10*3/uL — ABNORMAL HIGH (ref 4.0–10.5)
nRBC: 0 % (ref 0.0–0.2)

## 2022-08-10 LAB — CREATININE, SERUM
Creatinine, Ser: 0.9 mg/dL (ref 0.44–1.00)
GFR, Estimated: >60 mL/min (ref >60)

## 2022-08-10 LAB — LACTIC ACID, PLASMA: Lactic Acid, Venous: 0.9 mmol/L (ref 0.5–1.9)

## 2022-08-10 MED ORDER — ONDANSETRON HCL 4 MG/2ML IJ SOLN
4.0000 mg | Freq: Four times a day (QID) | INTRAMUSCULAR | Status: DC | PRN
  Filled 2022-08-10: qty 2

## 2022-08-10 MED ORDER — OXYCODONE-ACETAMINOPHEN 7.5-325 MG PO TABS
1.0000 | ORAL_TABLET | ORAL | Status: DC | PRN
  Administered 2022-08-10 – 2022-08-18 (×27): 1 via ORAL
  Filled 2022-08-10 (×27): qty 1

## 2022-08-10 MED ORDER — SODIUM CHLORIDE 0.9 % IV SOLN: 1.0000 g | Freq: Three times a day (TID) | INTRAVENOUS | Status: DC

## 2022-08-10 MED ORDER — VANCOMYCIN HCL IN DEXTROSE 1-5 GM/200ML-% IV SOLN
1000.0000 mg | INTRAVENOUS | Status: DC
  Filled 2022-08-10: qty 200

## 2022-08-10 MED ORDER — VANCOMYCIN HCL IN DEXTROSE 1-5 GM/200ML-% IV SOLN
1000.0000 mg | Freq: Once | INTRAVENOUS | Status: AC
  Administered 2022-08-10: 1000 mg via INTRAVENOUS
  Filled 2022-08-10: qty 200

## 2022-08-10 MED ORDER — ALBUTEROL SULFATE (2.5 MG/3ML) 0.083% IN NEBU: 2.5000 mg | INHALATION_SOLUTION | RESPIRATORY_TRACT | Status: DC | PRN

## 2022-08-10 MED ORDER — SODIUM CHLORIDE 0.9 % IV SOLN
2.0000 g | Freq: Once | INTRAVENOUS | Status: AC
  Administered 2022-08-10: 2 g via INTRAVENOUS
  Filled 2022-08-10: qty 12.5

## 2022-08-10 MED ORDER — POLYETHYLENE GLYCOL 3350 17 G PO PACK
17.0000 g | PACK | Freq: Every day | ORAL | Status: DC | PRN
  Administered 2022-08-11: 17 g via ORAL
  Filled 2022-08-10: qty 1

## 2022-08-10 MED ORDER — TRAZODONE HCL 50 MG PO TABS
25.0000 mg | ORAL_TABLET | Freq: Every evening | ORAL | Status: DC | PRN
  Administered 2022-08-11: 25 mg via ORAL
  Filled 2022-08-10: qty 1

## 2022-08-10 MED ORDER — SODIUM CHLORIDE 0.9 % IV SOLN
2.0000 g | INTRAVENOUS | Status: DC
  Administered 2022-08-10 – 2022-08-18 (×9): 2 g via INTRAVENOUS
  Filled 2022-08-10 (×9): qty 20

## 2022-08-10 MED ORDER — FENTANYL CITRATE PF 50 MCG/ML IJ SOSY
50.0000 ug | PREFILLED_SYRINGE | Freq: Once | INTRAMUSCULAR | Status: AC
  Administered 2022-08-10: 50 ug via INTRAVENOUS
  Filled 2022-08-10: qty 1

## 2022-08-10 MED ORDER — ENOXAPARIN SODIUM 40 MG/0.4ML IJ SOSY
40.0000 mg | PREFILLED_SYRINGE | INTRAMUSCULAR | Status: DC
  Administered 2022-08-10: 40 mg via SUBCUTANEOUS
  Filled 2022-08-10 (×2): qty 0.4

## 2022-08-10 MED ORDER — DOCUSATE SODIUM 100 MG PO CAPS
100.0000 mg | ORAL_CAPSULE | Freq: Two times a day (BID) | ORAL | Status: DC
  Administered 2022-08-10 – 2022-08-17 (×14): 100 mg via ORAL
  Filled 2022-08-10 (×14): qty 1

## 2022-08-10 MED ORDER — ONDANSETRON HCL 4 MG PO TABS: 4.0000 mg | ORAL_TABLET | Freq: Four times a day (QID) | ORAL | Status: DC | PRN

## 2022-08-10 MED ORDER — GADOBUTROL 1 MMOL/ML IV SOLN
6.0000 mL | Freq: Once | INTRAVENOUS | Status: AC | PRN
  Administered 2022-08-10: 6 mL via INTRAVENOUS

## 2022-08-10 MED ORDER — VANCOMYCIN HCL 1250 MG/250ML IV SOLN: 1250.0000 mg | INTRAVENOUS | Status: DC

## 2022-08-10 MED ORDER — IBUPROFEN 200 MG PO TABS
400.0000 mg | ORAL_TABLET | Freq: Four times a day (QID) | ORAL | Status: DC | PRN
  Administered 2022-08-10 – 2022-08-12 (×3): 400 mg via ORAL
  Filled 2022-08-10 (×3): qty 2

## 2022-08-10 MED ORDER — SODIUM CHLORIDE 0.9 % IV SOLN: 2.0000 g | Freq: Three times a day (TID) | INTRAVENOUS | Status: DC

## 2022-08-10 MED ORDER — SODIUM CHLORIDE 0.9 % IV SOLN: 2.0000 g | Freq: Two times a day (BID) | INTRAVENOUS | Status: DC

## 2022-08-10 MED ORDER — HYDROMORPHONE HCL 1 MG/ML IJ SOLN
1.0000 mg | Freq: Once | INTRAMUSCULAR | Status: AC
  Administered 2022-08-10: 1 mg via INTRAVENOUS
  Filled 2022-08-10: qty 1

## 2022-08-10 MED ORDER — OXYBUTYNIN CHLORIDE 5 MG PO TABS
5.0000 mg | ORAL_TABLET | Freq: Once | ORAL | Status: AC
  Administered 2022-08-10: 5 mg via ORAL
  Filled 2022-08-10: qty 1

## 2022-08-10 MED ORDER — IOHEXOL 300 MG/ML  SOLN
100.0000 mL | Freq: Once | INTRAMUSCULAR | Status: AC | PRN
  Administered 2022-08-10: 100 mL via INTRAVENOUS

## 2022-08-10 MED ORDER — METRONIDAZOLE 500 MG/100ML IV SOLN
500.0000 mg | Freq: Two times a day (BID) | INTRAVENOUS | Status: DC
  Administered 2022-08-10: 500 mg via INTRAVENOUS
  Filled 2022-08-10: qty 100

## 2022-08-10 MED ORDER — AMLODIPINE BESYLATE 5 MG PO TABS
5.0000 mg | ORAL_TABLET | Freq: Every day | ORAL | Status: DC
  Administered 2022-08-10 – 2022-08-18 (×9): 5 mg via ORAL
  Filled 2022-08-10 (×9): qty 1

## 2022-08-10 MED ORDER — METOPROLOL TARTRATE 5 MG/5ML IV SOLN: 5.0000 mg | Freq: Four times a day (QID) | INTRAVENOUS | Status: DC | PRN

## 2022-08-10 NOTE — Progress Notes (Addendum)
Pharmacy Antibiotic Note  Denise Hudson is a 62 y.o. female admitted on 08/09/2022 with discitis of lumbar region. Pharmacy has been consulted for Vancomycin dosing.  Plan: Vancomycin 1g IV x 1 given in the ED. Continue with Vancomycin 1g IV q24h. Expected AUC 508. Goal AUC 400-550. Vancomycin levels at steady state, as indicated. Adjust Cefepime to 2g IV q12h. Metronidazole per MD. Monitor renal function, cultures, clinical course.   Height: 6' (182.9 cm) Weight: 54.7 kg (120 lb 9.6 oz) IBW/kg (Calculated) : 73.1  Temp (24hrs), Avg:99.8 F (37.7 C), Min:98.3 F (36.8 C), Max:100.9 F (38.3 C)  Recent Labs  Lab 08/09/22 2154 08/10/22 0034  WBC 9.8  --   CREATININE 0.94  --   LATICACIDVEN  --  0.9    Estimated Creatinine Clearance: 55 mL/min (by C-G formula based on SCr of 0.94 mg/dL).    No Known Allergies  Antimicrobials this admission: 4/14 Vancomycin >> 4/14 Cefepime >> 4/14 Metronidazole >>  Dose adjustments this admission: --  Microbiology results: 4/14 BCx: ngtd 4/14 UCx:     Thank you for allowing pharmacy to be a part of this patient's care.   Greer Pickerel, PharmD, BCPS Clinical Pharmacist 08/10/2022 3:16 PM

## 2022-08-10 NOTE — ED Notes (Signed)
Patient used the bed pan. Linens changed.

## 2022-08-10 NOTE — ED Notes (Signed)
[facility] has been called for pt transfer to [facility].

## 2022-08-10 NOTE — Progress Notes (Signed)
Pharmacy Note   A consult was received from an ED physician for vancomycin per pharmacy dosing.    The patient's profile has been reviewed for ht/wt/allergies/indication/available labs.    A one time order has been placed for vancomycin 1000 mg IV x1 .    Further antibiotics/pharmacy consults should be ordered by admitting physician if indicated.                       Thank you,  Adalberto Cole, PharmD, BCPS 08/10/2022 9:05 AM

## 2022-08-10 NOTE — Progress Notes (Deleted)
Pharmacy Antibiotic Note  Denise Hudson is a 62 y.o. female admitted on 08/09/2022 with  discitis .  Pharmacy has been consulted for vancomycin dosing.  Plan: Vancomycin 1 g load in [facility] ED, then vancomycin 1250 mg IV q24h F/u renal function, clinical s/sx of improvement, and cx results.  Height: 6' (182.9 cm) Weight: 59 kg (130 lb) IBW/kg (Calculated) : 73.1  Temp (24hrs), Avg:99.8 F (37.7 C), Min:98.3 F (36.8 C), Max:100.9 F (38.3 C)  Recent Labs  Lab 08/09/22 2154 08/10/22 0034  WBC 9.8  --   CREATININE 0.94  --   LATICACIDVEN  --  0.9    Estimated Creatinine Clearance: 59.3 mL/min (by C-G formula based on SCr of 0.94 mg/dL).    No Known Allergies  Antimicrobials this admission: Vanc 4/14 >> Cefepime 4/14 >> Flagyl 4/14 >>  Dose adjustments this admission: None.  Microbiology results: 4/14 BCx: NGTD 4/13 UCx: sent    Thank you for allowing pharmacy to be a part of this patient's care.  Jamal Collin, PharmD 08/10/2022 2:58 PM

## 2022-08-10 NOTE — H&P (Signed)
History and Physical  Denise Hudson:[id number] DOB: [birth year] DOA: 08/09/2022  PCP: Oneita Hurt, No   Chief Complaint: Back pain  HPI: Denise Hudson is a 62 y.o. female with medical history significant for uncontrolled hypertension, kidney stones, recent admission for pyelonephritis and obstructive uropathy status post stent placement and removal couple of weeks ago being admitted to the hospital with lumbosacral discitis.  Patient states that she completed the course of cefadroxil prescribed at discharge from this facility on 07/19/2022.  Overall she has been doing well, but since stent was placed during that hospital stay, she has been having intermittent low back and vague lower abdominal pain.  She had lithotripsy and stent exchange on 3/27, and stent was later removed.  She was told her back pain should improve after stent removal, but it did not.  In any case, over the last 2 to 3 days, she developed worsening low back pain, pain is nonradiating.  She developed low-grade fevers, and nausea without vomiting.  Due to this, she came to the ER for evaluation.  ED Course: In the emergency department, she was found to be hypertensive and tachycardic.  Lab work was done which shows stable chronic microcytic anemia, no leukocytosis.  CT scan of the abdomen and pelvis, and later lumbar MRI confirms evidence of L5-S1 discitis/osteomyelitis with phlegmon but no obvious abscess.  The patient was given empiric IV cefepime, Flagyl, and vancomycin.  ER provider discussed with neurosurgery on-call, who recommends medical management with IV antibiotics for now, no surgical intervention indicated.  Review of Systems: Please see HPI for pertinent positives and negatives. A complete 10 system review of systems are otherwise negative.  Past Medical History:  Diagnosis Date   Constipation    History of acute pyelonephritis 07/12/2022   secondary to uropathy obstruction due to kidney stones   History of kidney stones     Microcytic anemia 07/12/2022   Nephrolithiasis    left side calculi non-obstructive per CT 07-18-2022   Ureteral calculi 07/12/2022   right side   Wears contact lenses    Past Surgical History:  Procedure Laterality Date   APPENDECTOMY  age 40   CYSTOSCOPY WITH STENT PLACEMENT Right 02/10/2017   Procedure: CYSTOSCOPY WITH RIGHT STENT PLACEMENT;  Surgeon: Rene Paci, MD;  Location: [facility];  Service: Urology;  Laterality: Right;   CYSTOSCOPY/URETEROSCOPY/HOLMIUM LASER/STENT PLACEMENT Right 03/04/2017   Procedure: CYSTOSCOPY/URETEROSCOPY/HOLMIUM LASER/STENT PLACEMENT;  Surgeon: Rene Paci, MD;  Location: [facility];  Service: Urology;  Laterality: Right;  NEEDS 30 MIN FOR PROCEDURE   CYSTOSCOPY/URETEROSCOPY/HOLMIUM LASER/STENT PLACEMENT Right 07/13/2022   Procedure: CYSTOSCOPY RIGHT URETERAL STENT PLACEMENT;  Surgeon: Crista Elliot, MD;  Location: [facility];  Service: Urology;  Laterality: Right;   CYSTOSCOPY/URETEROSCOPY/HOLMIUM LASER/STENT PLACEMENT Right 07/23/2022   Procedure: CYSTOSCOPY RIGHT URETEROSCOPY, HOLMIUM LASER LITHOTRIPSY, AND RIGHT URETERAL STENT PLACEMENT;  Surgeon: Rene Paci, MD;  Location: [facility];  Service: Urology;  Laterality: Right;  90 MINUTES   EXTRACORPOREAL SHOCK WAVE LITHOTRIPSY  2008    Social History:  reports that she quit smoking about 8 years ago. Her smoking use included cigarettes. She has never used smokeless tobacco. She reports that she does not drink alcohol and does not use drugs.   No Known Allergies  History reviewed. No pertinent family history.   Prior to Admission medications   Medication Sig Start Date End Date Taking? Authorizing Provider  amLODipine (NORVASC) 5 MG tablet Take 1 tablet (5 mg  total) by mouth daily. Patient not taking: Reported on 08/10/2022 07/20/22   Alberteen Sam, MD  ondansetron (ZOFRAN) 4 MG tablet Take 1 tablet (4 mg total) by mouth  every 6 (six) hours as needed for nausea. Patient not taking: Reported on 08/10/2022 07/19/22   Alberteen Sam, MD  oxybutynin (DITROPAN) 5 MG tablet Take 1 tablet (5 mg total) by mouth 3 (three) times daily. Patient not taking: Reported on 08/10/2022 07/19/22   Alberteen Sam, MD  oxyCODONE-acetaminophen (PERCOCET) 7.5-325 MG tablet Take 1 tablet by mouth every 4 (four) hours as needed for moderate pain. Patient not taking: Reported on 08/10/2022 07/19/22   Alberteen Sam, MD    Physical Exam: BP (!) 182/78 (BP Location: Left Arm)   Pulse 88   Temp 100.1 F (37.8 C) (Oral)   Resp 16   Ht 6' (1.829 m)   Wt 59 kg   LMP 09/23/2015   SpO2 100%   BMI 17.63 kg/m   General:  Alert, oriented, calm, looks slightly uncomfortable from back pain, nontoxic Eyes: EOMI, clear conjuctivae, white sclerea Neck: supple, no masses, trachea mildline  Cardiovascular: RRR, no murmurs or rubs, no peripheral edema  Respiratory: clear to auscultation bilaterally, no wheezes, no crackles  Abdomen: soft, nontender, nondistended, normal bowel tones heard  Skin: dry, no rashes  Musculoskeletal: no joint effusions, normal range of motion  Psychiatric: appropriate affect, normal speech  Neurologic: extraocular muscles intact, clear speech, moving all extremities with intact sensorium          Labs on Admission:  Basic Metabolic Panel: Recent Labs  Lab 08/09/22 2154  NA 133*  K 3.0*  CL 101  CO2 23  GLUCOSE 126*  BUN 17  CREATININE 0.94  CALCIUM 8.8*   Liver Function Tests: Recent Labs  Lab 08/09/22 2154  AST 17  ALT 18  ALKPHOS 161*  BILITOT 0.6  PROT 8.2*  ALBUMIN 2.7*   Recent Labs  Lab 08/09/22 2154  LIPASE 25   No results for input(s): "AMMONIA" in the last 168 hours. CBC: Recent Labs  Lab 08/09/22 2154  WBC 9.8  NEUTROABS 7.4  HGB 8.5*  HCT 28.4*  MCV 74.7*  PLT 384   Cardiac Enzymes: No results for input(s): "CKTOTAL", "CKMB", "CKMBINDEX",  "TROPONINI" in the last 168 hours.  BNP (last 3 results) No results for input(s): "BNP" in the last 8760 hours.  ProBNP (last 3 results) No results for input(s): "PROBNP" in the last 8760 hours.  CBG: No results for input(s): "GLUCAP" in the last 168 hours.  Radiological Exams on Admission: MR Lumbar Spine W Wo Contrast  Result Date: 08/10/2022 CLINICAL DATA:  Lower back pain with infection suspected. Positive x-ray EXAM: MRI LUMBAR SPINE WITHOUT AND WITH CONTRAST TECHNIQUE: Multiplanar and multiecho pulse sequences of the lumbar spine were obtained without and with intravenous contrast. CONTRAST:  6mL GADAVIST GADOBUTROL 1 MMOL/ML IV SOLN COMPARISON:  Abdominal CT from earlier today FINDINGS: Segmentation:  5 lumbar type vertebrae Alignment: Straightening of lumbar lordosis with slight L3-4 anterolisthesis Vertebrae: Marrow edema and endplate destruction at L5-S1 with paravertebral inflammation. The disc space is nonenhancing, likely purulence. Ventral epidural enhancement without collection. Partially covered corner edema at T12 anteriorly and superiorly. Conus medullaris and cauda equina: Conus extends to the L1 level. Conus and cauda equina appear normal. Paraspinal and other soft tissues: As above Disc levels: Generalized disc desiccation and narrowing with bulging. Limited facet degeneration. Disc bulging and height loss at L5-S1 causes moderate  biforaminal stenosis. IMPRESSION: 1. 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 No spinal canal abscess. 2. Partially covered inflammation at the T12 anterior superior corner, likely additional site of discitis at T11-12 based on preceding CT. Electronically Signed   By: Tiburcio Pea M.D.   On: 08/10/2022 08:42   CT ABDOMEN PELVIS W CONTRAST  Result Date: 08/10/2022 CLINICAL DATA:  Sepsis, right flank pain, left lower quadrant pain, and fever. Recent pyelonephritis with kidney stone. EXAM: CT ABDOMEN  AND PELVIS WITH CONTRAST TECHNIQUE: Multidetector CT imaging of the abdomen and pelvis was performed using the standard protocol following bolus administration of intravenous contrast. RADIATION DOSE REDUCTION: This exam was performed according to the departmental dose-optimization program which includes automated exposure control, adjustment of the mA and/or kV according to patient size and/or use of iterative reconstruction technique. CONTRAST:  OMNIPAQUE IOHEXOL 300 MG/ML  SOLN COMPARISON:  07/18/2022. FINDINGS: Lower chest: The heart is enlarged. There is atelectasis at the lung bases. A 5 mm pleural-based nodule is present in the left lower lobe, axial image 1. Hepatobiliary: No focal liver abnormality is seen. No gallstones, gallbladder wall thickening, or biliary dilatation. Pancreas: Unremarkable. No pancreatic ductal dilatation or surrounding inflammatory changes. Spleen: Spleen is normal in size. Scattered subcentimeter hypodensities are present in the spleen, possible cysts or hemangiomas. Adrenals/Urinary Tract: The adrenal glands are within normal limits. The kidneys enhance symmetrically. Nonobstructive renal calculi are noted bilaterally. There is a cyst in the upper pole of the right kidney. No hydroureteronephrosis bilaterally. The bladder is within normal limits. Stomach/Bowel: Stomach is within normal limits. Appendix is not seen. No evidence of bowel wall thickening, distention, or inflammatory changes. No free air or pneumatosis. Vascular/Lymphatic: Aortic atherosclerosis. A nonspecific enlarged lymph node is present along the iliac chain on the right measuring 1.1 cm. Reproductive: Uterus and bilateral adnexa are unremarkable. Other: No abdominopelvic ascites. Musculoskeletal: Degenerative changes are present in the thoracolumbar spine. There are bony erosions along the endplates at L5-S1 with increased soft tissue density in the region and likely disc herniation. No acute fracture.  IMPRESSION: 1. Bilateral nephrolithiasis with no obstructive uropathy bilaterally. 2. New erosions at the inferior endplate at L5 and superior endplate as S1 with with surrounding soft tissue thickening, concerning for osteomyelitis/discitis. MRI with contrast is recommended for further evaluation. 3. 5 mm pleural-based nodule in the left lower lobe. No follow-up needed if patient is low-risk.This recommendation follows the consensus statement: Guidelines for Management of Incidental Pulmonary Nodules Detected on CT Images: From the Fleischner Society 2017; Radiology 2017; 284:228-243. Electronically Signed   By: Thornell Sartorius M.D.   On: 08/10/2022 01:38    Assessment/Plan Principal Problem:   Discitis of lumbar region-most likely related to her recent UTI and pyelonephritis, though blood cultures from that hospital stay remain negative.  Patient is hemodynamically stable, not septic.  Normal lactate noted. -Inpatient admission -Follow blood cultures from this admission -Continue empiric IV vancomycin, IV cefepime, IV Flagyl -Consider ID consultation in the coming days pending blood cultures and clinical response; no need to treat empirically Active Problems:   Ureterolithiasis-status post lithotripsy and stent exchange 3/27   Pyelonephritis of right kidney-treated and resolved   Microcytic anemia-stable   Hypokalemia-repleted with oral potassium, recheck with morning labs   Benign essential HTN-blood pressure remains uncontrolled, was discharged on amlodipine but has not been taking this medication, will continue this for now.  Blood pressure is likely also exacerbated by her current discomfort.  DVT prophylaxis:  Lovenox     Code Status: Full Code  Consults called: None  Admission status: The appropriate patient status for this patient is INPATIENT. Inpatient status is judged to be reasonable and necessary in order to provide the required intensity of service to ensure the patient's safety.  The patient's presenting symptoms, physical exam findings, and initial radiographic and laboratory data in the context of their chronic comorbidities is felt to place them at high risk for further clinical deterioration. Furthermore, it is not anticipated that the patient will be medically stable for discharge from the hospital within 2 midnights of admission.    I certify that at the point of admission it is my clinical judgment that the patient will require inpatient hospital care spanning beyond 2 midnights from the point of admission due to high intensity of service, high risk for further deterioration and high frequency of surveillance required   Time spent: 56 minutes  Saidee Geremia Sharlette Dense MD [facility] Pager [phone]  If 7PM-7AM, please contact night-coverage [email or web address] Password [facility]  08/10/2022, 11:54 AM

## 2022-08-10 NOTE — ED Notes (Signed)
Provdier notified of pts elevated temp.

## 2022-08-10 NOTE — ED Notes (Signed)
ED TO INPATIENT HANDOFF REPORT  ED Nurse Name and Phone #: Beckie Viscardi   S Name/Age/Gender Laren Everts 62 y.o. female Room/Bed: WA13/WA13  Code Status   Code Status: Full Code  Home/SNF/Other  Patient oriented to: self, place, time, and situation Is this baseline? 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 Arrives POV c/o abdominal pain that radiates to the back, pelvis and legs. Pt. States that she had surgery about a month ago to have a stent placed in her kidney and stones removed. She thinks her pain is associated with her surgery.  Pt. States that her pain is so bad that it makes her short of breath.   Allergies No Known Allergies  Level of Care/Admitting Diagnosis ED Disposition     ED Disposition  Admit   Condition  --   Comment  Hospital Area: [facility] [[id number]]  Level of Care: Med-Surg [16]  May admit patient to [facility] or [facility] if equivalent level of care is available:: Yes  Covid Evaluation: Asymptomatic - no recent exposure (last 10 days) testing not required  Diagnosis: Discitis of lumbosacral region [phone]  Admitting Physician: Maryln Gottron [[id number]]  Attending Physician: Kirby Crigler, MIR [place] [[id number]]  Certification:: I certify this patient will need inpatient services for at least 2 midnights  Estimated Length of Stay: 3          B Medical/Surgery History Past Medical History:  Diagnosis Date   Constipation    History of acute pyelonephritis 07/12/2022   secondary to uropathy obstruction due to kidney stones   History of kidney stones    Microcytic anemia 07/12/2022   Nephrolithiasis    left side calculi non-obstructive per CT 07-18-2022   Ureteral calculi 07/12/2022   right side   Wears contact lenses    Past Surgical History:  Procedure Laterality Date   APPENDECTOMY  age 7   CYSTOSCOPY WITH STENT PLACEMENT Right 02/10/2017    Procedure: CYSTOSCOPY WITH RIGHT STENT PLACEMENT;  Surgeon: Rene Paci, MD;  Location: [facility];  Service: Urology;  Laterality: Right;   CYSTOSCOPY/URETEROSCOPY/HOLMIUM LASER/STENT PLACEMENT Right 03/04/2017   Procedure: CYSTOSCOPY/URETEROSCOPY/HOLMIUM LASER/STENT PLACEMENT;  Surgeon: Rene Paci, MD;  Location: [facility];  Service: Urology;  Laterality: Right;  NEEDS 30 MIN FOR PROCEDURE   CYSTOSCOPY/URETEROSCOPY/HOLMIUM LASER/STENT PLACEMENT Right 07/13/2022   Procedure: CYSTOSCOPY RIGHT URETERAL STENT PLACEMENT;  Surgeon: Crista Elliot, MD;  Location: [facility];  Service: Urology;  Laterality: Right;   CYSTOSCOPY/URETEROSCOPY/HOLMIUM LASER/STENT PLACEMENT Right 07/23/2022   Procedure: CYSTOSCOPY RIGHT URETEROSCOPY, HOLMIUM LASER LITHOTRIPSY, AND RIGHT URETERAL STENT PLACEMENT;  Surgeon: Rene Paci, MD;  Location: [facility];  Service: Urology;  Laterality: Right;  90 MINUTES   EXTRACORPOREAL SHOCK WAVE LITHOTRIPSY  2008     A IV Location/Drains/Wounds Patient Lines/Drains/Airways Status     Active Line/Drains/Airways     Name Placement date Placement time Site Days   Peripheral IV 08/10/22 20 G 1" Left;Posterior Forearm 08/10/22  0032  Forearm  less than 1   Peripheral IV 08/10/22 20 G 1" Anterior;Left Forearm 08/10/22  --  Forearm  less than 1   Ureteral Drain/Stent Right ureter 6 Fr. 03/04/17  1150  Right ureter  1985   Ureteral Drain/Stent Right ureter 6 Fr. 07/13/22  0803  Right ureter  28   Ureteral Drain/Stent Right ureter 6 Fr. 07/23/22  1018  Right ureter  18   Wound / Incision (Open or Dehisced) 09/23/15 Puncture Finger (Comment which one) Left 09/23/15  2005  Finger (Comment which one)  2513            Intake/Output Last 24 hours No intake or output data in the 24 hours ending 08/10/22 1302  Labs/Imaging Results for orders placed or performed during the hospital encounter of 08/09/22 (from the  past 48 hour(s))  Comprehensive metabolic panel     Status: Abnormal   Collection Time: 08/09/22  9:54 PM  Result Value Ref Range   Sodium 133 (L) 135 - 145 mmol/L   Potassium 3.0 (L) 3.5 - 5.1 mmol/L   Chloride 101 98 - 111 mmol/L   CO2 23 22 - 32 mmol/L   Glucose, Bld 126 (H) 70 - 99 mg/dL    Comment: Glucose reference range applies only to samples taken after fasting for at least 8 hours.   BUN 17 6 - 20 mg/dL   Creatinine, Ser 1.91 0.44 - 1.00 mg/dL   Calcium 8.8 (L) 8.9 - 10.3 mg/dL   Total Protein 8.2 (H) 6.5 - 8.1 g/dL   Albumin 2.7 (L) 3.5 - 5.0 g/dL   AST 17 15 - 41 U/L   ALT 18 0 - 44 U/L   Alkaline Phosphatase 161 (H) 38 - 126 U/L   Total Bilirubin 0.6 0.3 - 1.2 mg/dL   GFR, Estimated >47 >82 mL/min    Comment: (NOTE) Calculated using the CKD-EPI Creatinine Equation (2021)    Anion gap 9 5 - 15    Comment: Performed at [facility], [address] [place], [place] [address]  Lipase, blood     Status: None   Collection Time: 08/09/22  9:54 PM  Result Value Ref Range   Lipase 25 11 - 51 U/L    Comment: Performed at [facility], [address] [place], [place] [address]  CBC with Diff     Status: Abnormal   Collection Time: 08/09/22  9:54 PM  Result Value Ref Range   WBC 9.8 4.0 - 10.5 K/uL   RBC 3.80 (L) 3.87 - 5.11 MIL/uL   Hemoglobin 8.5 (L) 12.0 - 15.0 g/dL    Comment: Reticulocyte Hemoglobin testing may be clinically indicated, consider ordering this additional test [id number]    HCT 28.4 (L) 36.0 - 46.0 %   MCV 74.7 (L) 80.0 - 100.0 fL   MCH 22.4 (L) 26.0 - 34.0 pg   MCHC 29.9 (L) 30.0 - 36.0 g/dL   RDW 52.8 (H) 41.3 - 24.4 %   Platelets 384 150 - 400 K/uL   nRBC 0.0 0.0 - 0.2 %   Neutrophils Relative % 75 %   Neutro Abs 7.4 1.7 - 7.7 K/uL   Lymphocytes Relative 14 %   Lymphs Abs 1.4 0.7 - 4.0 K/uL   Monocytes Relative 9 %   Monocytes Absolute 0.8 0.1 - 1.0 K/uL   Eosinophils Relative 0 %   Eosinophils  Absolute 0.0 0.0 - 0.5 K/uL   Basophils Relative 1 %   Basophils Absolute 0.1 0.0 - 0.1 K/uL   Immature Granulocytes 1 %   Abs Immature Granulocytes 0.10 (H) 0.00 - 0.07 K/uL    Comment: Performed at [facility], [address], [place], [place] [address]  Urinalysis, Routine w reflex microscopic -Urine, Clean Catch     Status: Abnormal   Collection Time: 08/09/22  9:59 PM  Result Value Ref Range  Color, Urine YELLOW YELLOW   APPearance HAZY (A) CLEAR   Specific Gravity, Urine 1.018 1.005 - 1.030   pH 5.0 5.0 - 8.0   Glucose, UA NEGATIVE NEGATIVE mg/dL   Hgb urine dipstick SMALL (A) NEGATIVE   Bilirubin Urine NEGATIVE NEGATIVE   Ketones, ur NEGATIVE NEGATIVE mg/dL   Protein, ur 30 (A) NEGATIVE mg/dL   Nitrite NEGATIVE NEGATIVE   Leukocytes,Ua LARGE (A) NEGATIVE   RBC / HPF 11-20 0 - 5 RBC/hpf   WBC, UA >50 0 - 5 WBC/hpf   Bacteria, UA RARE (A) NONE SEEN   Squamous Epithelial / HPF 6-10 0 - 5 /HPF   Mucus PRESENT    Hyaline Casts, UA PRESENT     Comment: Performed at [facility], [address], [place], [place] [address]  Lactic acid, plasma     Status: None   Collection Time: 08/10/22 12:34 AM  Result Value Ref Range   Lactic Acid, Venous 0.9 0.5 - 1.9 mmol/L    Comment: Performed at [facility], [address], [place], [place] [address]  Culture, blood (routine x 2)     Status: None (Preliminary result)   Collection Time: 08/10/22 12:34 AM   Specimen: BLOOD LEFT FOREARM  Result Value Ref Range   Specimen Description      BLOOD LEFT FOREARM Performed at [facility], [address], [place], [place] [address]    Special Requests      BOTTLES DRAWN AEROBIC AND ANAEROBIC Blood Culture adequate volume Performed at [facility], [address], [place], [place] [address]    Culture      NO GROWTH < 12 HOURS Performed at [facility], [address] [place], [place]  [address]    Report Status PENDING   Culture, blood (routine x 2)     Status: None (Preliminary result)   Collection Time: 08/10/22  1:31 AM   Specimen: BLOOD  Result Value Ref Range   Specimen Description BLOOD RIGHT ANTECUBITAL    Special Requests      BOTTLES DRAWN AEROBIC AND ANAEROBIC Blood Culture adequate volume   Culture      NO GROWTH < 12 HOURS Performed at [facility], [address], [place], [place] [address]    Report Status PENDING    MR Lumbar Spine W Wo Contrast  Result Date: 08/10/2022 CLINICAL DATA:  Lower back pain with infection suspected. Positive x-ray EXAM: MRI LUMBAR SPINE WITHOUT AND WITH CONTRAST TECHNIQUE: Multiplanar and multiecho pulse sequences of the lumbar spine were obtained without and with intravenous contrast. CONTRAST:  6mL GADAVIST GADOBUTROL 1 MMOL/ML IV SOLN COMPARISON:  Abdominal CT from earlier today FINDINGS: Segmentation:  5 lumbar type vertebrae Alignment: Straightening of lumbar lordosis with slight L3-4 anterolisthesis Vertebrae: Marrow edema and endplate destruction at L5-S1 with paravertebral inflammation. The disc space is nonenhancing, likely purulence. Ventral epidural enhancement without collection. Partially covered corner edema at T12 anteriorly and superiorly. Conus medullaris and cauda equina: Conus extends to the L1 level. Conus and cauda equina appear normal. Paraspinal and other soft tissues: As above Disc levels: Generalized disc desiccation and narrowing with bulging. Limited facet degeneration. Disc bulging and height loss at L5-S1 causes moderate biforaminal stenosis. IMPRESSION: 1. 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 No spinal canal abscess. 2. Partially covered inflammation at the T12 anterior superior corner, likely additional site of discitis at T11-12 based on preceding CT.  Electronically Signed   By: Tiburcio Pea M.D.   On: 08/10/2022 08:42   CT ABDOMEN PELVIS W  CONTRAST  Result Date: 08/10/2022 CLINICAL DATA:  Sepsis, right flank pain, left lower quadrant pain, and fever. Recent pyelonephritis with kidney stone. EXAM: CT ABDOMEN AND PELVIS WITH CONTRAST TECHNIQUE: Multidetector CT imaging of the abdomen and pelvis was performed using the standard protocol following bolus administration of intravenous contrast. RADIATION DOSE REDUCTION: This exam was performed according to the departmental dose-optimization program which includes automated exposure control, adjustment of the mA and/or kV according to patient size and/or use of iterative reconstruction technique. CONTRAST:  OMNIPAQUE IOHEXOL 300 MG/ML  SOLN COMPARISON:  07/18/2022. FINDINGS: Lower chest: The heart is enlarged. There is atelectasis at the lung bases. A 5 mm pleural-based nodule is present in the left lower lobe, axial image 1. Hepatobiliary: No focal liver abnormality is seen. No gallstones, gallbladder wall thickening, or biliary dilatation. Pancreas: Unremarkable. No pancreatic ductal dilatation or surrounding inflammatory changes. Spleen: Spleen is normal in size. Scattered subcentimeter hypodensities are present in the spleen, possible cysts or hemangiomas. Adrenals/Urinary Tract: The adrenal glands are within normal limits. The kidneys enhance symmetrically. Nonobstructive renal calculi are noted bilaterally. There is a cyst in the upper pole of the right kidney. No hydroureteronephrosis bilaterally. The bladder is within normal limits. Stomach/Bowel: Stomach is within normal limits. Appendix is not seen. No evidence of bowel wall thickening, distention, or inflammatory changes. No free air or pneumatosis. Vascular/Lymphatic: Aortic atherosclerosis. A nonspecific enlarged lymph node is present along the iliac chain on the right measuring 1.1 cm. Reproductive: Uterus and bilateral adnexa are unremarkable. Other: No abdominopelvic ascites. Musculoskeletal: Degenerative changes are present in the  thoracolumbar spine. There are bony erosions along the endplates at L5-S1 with increased soft tissue density in the region and likely disc herniation. No acute fracture. IMPRESSION: 1. Bilateral nephrolithiasis with no obstructive uropathy bilaterally. 2. New erosions at the inferior endplate at L5 and superior endplate as S1 with with surrounding soft tissue thickening, concerning for osteomyelitis/discitis. MRI with contrast is recommended for further evaluation. 3. 5 mm pleural-based nodule in the left lower lobe. No follow-up needed if patient is low-risk.This recommendation follows the consensus statement: Guidelines for Management of Incidental Pulmonary Nodules Detected on CT Images: From the Fleischner Society 2017; Radiology 2017; 284:228-243. Electronically Signed   By: Thornell Sartorius M.D.   On: 08/10/2022 01:38    Pending Labs Unresulted Labs (From admission, onward)     Start     Ordered   08/17/22 0500  Creatinine, serum  (enoxaparin (LOVENOX)    CrCl >/= 30 ml/min)  Weekly,   R     Comments: while on enoxaparin therapy    08/10/22 1152   08/11/22 0500  Basic metabolic panel  Tomorrow morning,   R        08/10/22 1152   08/11/22 0500  CBC  Tomorrow morning,   R        08/10/22 1152   08/10/22 1151  CBC  (enoxaparin (LOVENOX)    CrCl >/= 30 ml/min)  Once,   R       Comments: Baseline for enoxaparin therapy IF NOT ALREADY DRAWN.  Notify MD if PLT < 100 K.    08/10/22 1152   08/10/22 1151  Creatinine, serum  (enoxaparin (LOVENOX)    CrCl >/= 30 ml/min)  Once,   R       Comments: Baseline for enoxaparin therapy IF NOT ALREADY  DRAWN.    08/10/22 1152   08/09/22 2357  Urine Culture  Once,   URGENT       Question:  Indication  Answer:  Sepsis   08/09/22 2356            Vitals/Pain Today's Vitals   08/10/22 0845 08/10/22 0956 08/10/22 1100 08/10/22 1102  BP:   (!) 191/69 (!) 182/78  Pulse:   99 88  Resp:    16  Temp:    100.1 F (37.8 C)  TempSrc:    Oral  SpO2:   100%  100%  Weight:      Height:      PainSc: 10-Worst pain ever 7       Isolation Precautions No active isolations  Medications Medications  metroNIDAZOLE (FLAGYL) IVPB 500 mg (0 mg Intravenous Stopped 08/10/22 1050)  oxyCODONE-acetaminophen (PERCOCET) 7.5-325 MG per tablet 1 tablet (has no administration in time range)  amLODipine (NORVASC) tablet 5 mg (has no administration in time range)  enoxaparin (LOVENOX) injection 40 mg (has no administration in time range)  ibuprofen (ADVIL) tablet 400 mg (has no administration in time range)  traZODone (DESYREL) tablet 25 mg (has no administration in time range)  docusate sodium (COLACE) capsule 100 mg (has no administration in time range)  polyethylene glycol (MIRALAX / GLYCOLAX) packet 17 g (has no administration in time range)  ondansetron (ZOFRAN) tablet 4 mg (has no administration in time range)    Or  ondansetron (ZOFRAN) injection 4 mg (has no administration in time range)  albuterol (PROVENTIL) (2.5 MG/3ML) 0.083% nebulizer solution 2.5 mg (has no administration in time range)  metoprolol tartrate (LOPRESSOR) injection 5 mg (has no administration in time range)  ceFEPIme (MAXIPIME) 2 g in sodium chloride 0.9 % 100 mL IVPB (has no administration in time range)  fentaNYL (SUBLIMAZE) injection 50 mcg (50 mcg Intravenous Given 08/10/22 0032)  ondansetron (ZOFRAN) injection 4 mg (4 mg Intravenous Given 08/10/22 0032)  lactated ringers bolus 1,000 mL (0 mLs Intravenous Stopped 08/10/22 0630)  potassium chloride SA (KLOR-CON M) CR tablet 40 mEq (40 mEq Oral Given 08/10/22 0122)  iohexol (OMNIPAQUE) 300 MG/ML solution 100 mL (100 mLs Intravenous Contrast Given 08/10/22 0107)  fentaNYL (SUBLIMAZE) injection 50 mcg (50 mcg Intravenous Given 08/10/22 0130)  fentaNYL (SUBLIMAZE) injection 50 mcg (50 mcg Intravenous Given 08/10/22 0734)  gadobutrol (GADAVIST) 1 MMOL/ML injection 6 mL (6 mLs Intravenous Contrast Given 08/10/22 0809)  ceFEPIme (MAXIPIME) 2 g in  sodium chloride 0.9 % 100 mL IVPB (0 g Intravenous Stopped 08/10/22 0940)  vancomycin (VANCOCIN) IVPB 1000 mg/200 mL premix (0 mg Intravenous Stopped 08/10/22 1050)  HYDROmorphone (DILAUDID) injection 1 mg (1 mg Intravenous Given 08/10/22 0932)  oxybutynin (DITROPAN) tablet 5 mg (5 mg Oral Given 08/10/22 0932)    Mobility walks     Focused Assessments    R Recommendations: See Admitting Provider Note  Report given to:   Additional Notes:

## 2022-08-10 NOTE — ED Provider Notes (Signed)
Clinical Course as of 08/10/22 1046  Sun Aug 10, 2022  0730 Pyelo and obstructed stone s/p stent here with complaints of fever and bilateral low back pain radiating to abdomen. Findings at L5 on CT c/f discitis/osteomyelitis. Questionable UTI- previous cx pan sensitive E coli. Follow up MRI/patient reassessment. [VB]  [id number] Reassessed patient, she is still complaining of severe spasm-like pains in her legs and lower back.  She has no spinal point tenderness.  Her neuroexam is limited due to pain but she is able to lift bilateral lower extremities against gravity.  She is denying any sensation deficits.  MRI concerning for discitis osteomyelitis with epidural phlegmon.  I have waiting on neurosurgery consult and I have ordered broad-spectrum antibiotics. [VB]  1012 S/w Leo Grosser on-call FNP of neurosurgery she just recommends continued IV antibiotics and medical admission.  They do recommend a thoracic MRI.  They do not require a formal neurosurgery consult.  Patient could get IR biopsy inpatient. [VB]  1046 Spoke with on-call hospitalist Dr. Erenest Blank who will admit patient for continued IV antibiotics and further workup of new discitis osteomyelitis of the spine. [VB]    Clinical Course User Index [VB] Mardene Sayer, MD   CRITICAL CARE Performed by: Mardene Sayer  New discitis osteomyelitis with epidural phlegmon requiring IV broad-spectrum antibiotics.  Neurosurgery consult made.  Medicine admission. Total critical care time: 35 minutes  Critical care time was exclusive of separately billable procedures and treating other patients.  Critical care was necessary to treat or prevent imminent or life-threatening deterioration.  Critical care was time spent personally by me on the following activities: development of treatment plan with patient and/or surrogate as well as nursing, discussions with consultants, evaluation of patient's response to treatment, examination of patient, obtaining  history from patient or surrogate, ordering and performing treatments and interventions, ordering and review of laboratory studies, ordering and review of radiographic studies, pulse oximetry and re-evaluation of patient's condition.    Mardene Sayer, MD 08/10/22 1047

## 2022-08-10 NOTE — Progress Notes (Signed)
PHARMACY - PHYSICIAN COMMUNICATION CRITICAL VALUE ALERT - BLOOD CULTURE IDENTIFICATION (BCID)  Denise Hudson is an 62 y.o. female who [facility] with a chief complaint of back pain.   Assessment: MRI finding L5-S1 discitis/OM w/ phlegmon but no obvious abscess. Bcx 1/4 finding GNR - BCID showing Ecoli (no resistance).   Name of physician (or Provider) Contacted: Dr Avie Arenas  Current antibiotics: Cefepime, metronidazole, and vancomycin  Changes to prescribed antibiotics recommended:  Change regimen to ceftriaxone after discussion with MD.  Results for orders placed or performed during the hospital encounter of 08/09/22  Blood Culture ID Panel (Reflexed) (Collected: 08/10/2022  1:31 AM)  Result Value Ref Range   Enterococcus faecalis NOT DETECTED NOT DETECTED   Enterococcus Faecium NOT DETECTED NOT DETECTED   Listeria monocytogenes NOT DETECTED NOT DETECTED   Staphylococcus species NOT DETECTED NOT DETECTED   Staphylococcus aureus (BCID) NOT DETECTED NOT DETECTED   Staphylococcus epidermidis NOT DETECTED NOT DETECTED   Staphylococcus lugdunensis NOT DETECTED NOT DETECTED   Streptococcus species NOT DETECTED NOT DETECTED   Streptococcus agalactiae NOT DETECTED NOT DETECTED   Streptococcus pneumoniae NOT DETECTED NOT DETECTED   Streptococcus pyogenes NOT DETECTED NOT DETECTED   A.calcoaceticus-baumannii NOT DETECTED NOT DETECTED   Bacteroides fragilis NOT DETECTED NOT DETECTED   Enterobacterales DETECTED (A) NOT DETECTED   Enterobacter cloacae complex NOT DETECTED NOT DETECTED   Escherichia coli DETECTED (A) NOT DETECTED   Klebsiella aerogenes NOT DETECTED NOT DETECTED   Klebsiella oxytoca NOT DETECTED NOT DETECTED   Klebsiella pneumoniae NOT DETECTED NOT DETECTED   Proteus species NOT DETECTED NOT DETECTED   Salmonella species NOT DETECTED NOT DETECTED   Serratia marcescens NOT DETECTED NOT DETECTED   Haemophilus influenzae NOT DETECTED NOT DETECTED    Neisseria meningitidis NOT DETECTED NOT DETECTED   Pseudomonas aeruginosa NOT DETECTED NOT DETECTED   Stenotrophomonas maltophilia NOT DETECTED NOT DETECTED   Candida albicans NOT DETECTED NOT DETECTED   Candida auris NOT DETECTED NOT DETECTED   Candida glabrata NOT DETECTED NOT DETECTED   Candida krusei NOT DETECTED NOT DETECTED   Candida parapsilosis NOT DETECTED NOT DETECTED   Candida tropicalis NOT DETECTED NOT DETECTED   Cryptococcus neoformans/gattii NOT DETECTED NOT DETECTED   CTX-M ESBL NOT DETECTED NOT DETECTED   Carbapenem resistance IMP NOT DETECTED NOT DETECTED   Carbapenem resistance KPC NOT DETECTED NOT DETECTED   Carbapenem resistance NDM NOT DETECTED NOT DETECTED   Carbapenem resist OXA 48 LIKE NOT DETECTED NOT DETECTED   Carbapenem resistance VIM NOT DETECTED NOT DETECTED    Thank you for allowing pharmacy to participate in this patient's care,  Sherron Monday, PharmD, BCCCP Clinical Pharmacist  Phone: [phone] 08/10/2022 6:07 PM  Please check AMION for all [facility] phone numbers After 10:00 PM, call Main Pharmacy [phone]

## 2022-08-11 DIAGNOSIS — I10 Essential (primary) hypertension: Secondary | ICD-10-CM

## 2022-08-11 DIAGNOSIS — M4627 Osteomyelitis of vertebra, lumbosacral region: Secondary | ICD-10-CM | POA: Diagnosis not present

## 2022-08-11 DIAGNOSIS — N201 Calculus of ureter: Secondary | ICD-10-CM | POA: Diagnosis not present

## 2022-08-11 DIAGNOSIS — R7881 Bacteremia: Secondary | ICD-10-CM

## 2022-08-11 DIAGNOSIS — N12 Tubulo-interstitial nephritis, not specified as acute or chronic: Secondary | ICD-10-CM | POA: Diagnosis not present

## 2022-08-11 DIAGNOSIS — B9620 Unspecified Escherichia coli [E. coli] as the cause of diseases classified elsewhere: Secondary | ICD-10-CM

## 2022-08-11 DIAGNOSIS — M4646 Discitis, unspecified, lumbar region: Secondary | ICD-10-CM | POA: Diagnosis not present

## 2022-08-11 DIAGNOSIS — M4647 Discitis, unspecified, lumbosacral region: Secondary | ICD-10-CM

## 2022-08-11 DIAGNOSIS — M4644 Discitis, unspecified, thoracic region: Secondary | ICD-10-CM

## 2022-08-11 DIAGNOSIS — R636 Underweight: Secondary | ICD-10-CM

## 2022-08-11 DIAGNOSIS — E876 Hypokalemia: Secondary | ICD-10-CM

## 2022-08-11 DIAGNOSIS — E871 Hypo-osmolality and hyponatremia: Secondary | ICD-10-CM

## 2022-08-11 DIAGNOSIS — D509 Iron deficiency anemia, unspecified: Secondary | ICD-10-CM

## 2022-08-11 LAB — CBC
HCT: 24.8 % — ABNORMAL LOW (ref 36.0–46.0)
Hemoglobin: 7.5 g/dL — ABNORMAL LOW (ref 12.0–15.0)
MCH: 22.2 pg — ABNORMAL LOW (ref 26.0–34.0)
MCHC: 30.2 g/dL (ref 30.0–36.0)
MCV: 73.4 fL — ABNORMAL LOW (ref 80.0–100.0)
Platelets: 304 10*3/uL (ref 150–400)
RBC: 3.38 MIL/uL — ABNORMAL LOW (ref 3.87–5.11)
RDW: 15.8 % — ABNORMAL HIGH (ref 11.5–15.5)
WBC: 8.5 10*3/uL (ref 4.0–10.5)
nRBC: 0 % (ref 0.0–0.2)

## 2022-08-11 LAB — BASIC METABOLIC PANEL
Anion gap: 8 (ref 5–15)
BUN: 18 mg/dL (ref 6–20)
CO2: 25 mmol/L (ref 22–32)
Calcium: 9 mg/dL (ref 8.9–10.3)
Chloride: 104 mmol/L (ref 98–111)
Creatinine, Ser: 0.84 mg/dL (ref 0.44–1.00)
GFR, Estimated: >60 mL/min (ref >60)
Glucose, Bld: 107 mg/dL — ABNORMAL HIGH (ref 70–99)
Potassium: 3.5 mmol/L (ref 3.5–5.1)
Sodium: 137 mmol/L (ref 135–145)

## 2022-08-11 LAB — RETICULOCYTES
Immature Retic Fract: 26.4 % — ABNORMAL HIGH (ref 2.3–15.9)
RBC.: 3.27 MIL/uL — ABNORMAL LOW (ref 3.87–5.11)
Retic Count, Absolute: 52.3 10*3/uL (ref 19.0–186.0)
Retic Ct Pct: 1.6 % (ref 0.4–3.1)

## 2022-08-11 LAB — URINE CULTURE: Culture: NO GROWTH

## 2022-08-11 LAB — TSH: TSH: 1.831 u[IU]/mL (ref 0.350–4.500)

## 2022-08-11 LAB — IRON AND TIBC
Iron: 7 ug/dL — ABNORMAL LOW (ref 28–170)
Saturation Ratios: 5 % — ABNORMAL LOW (ref 10.4–31.8)
TIBC: 150 ug/dL — ABNORMAL LOW (ref 250–450)
UIBC: 143 ug/dL

## 2022-08-11 LAB — VITAMIN B12: Vitamin B-12: 273 pg/mL (ref 180–914)

## 2022-08-11 LAB — FERRITIN: Ferritin: 335 ng/mL — ABNORMAL HIGH (ref 11–307)

## 2022-08-11 LAB — FOLATE: Folate: 7.2 ng/mL (ref >5.9)

## 2022-08-11 MED ORDER — ENSURE ENLIVE PO LIQD
237.0000 mL | Freq: Three times a day (TID) | ORAL | Status: DC
  Administered 2022-08-11 – 2022-08-17 (×18): 237 mL via ORAL
  Filled 2022-08-11: qty 237

## 2022-08-11 MED ORDER — HYDRALAZINE HCL 25 MG PO TABS
25.0000 mg | ORAL_TABLET | Freq: Four times a day (QID) | ORAL | Status: DC | PRN
  Administered 2022-08-12: 25 mg via ORAL
  Filled 2022-08-11: qty 1

## 2022-08-11 NOTE — Plan of Care (Signed)

## 2022-08-11 NOTE — Consult Note (Signed)
[facility]                                                                                        Patient Identification: Patient Name: Denise Hudson MRN: [id number] Admit Date: 08/09/2022  9:30 PM Today's Date: 08/11/2022 Reason for consult: Bacteremia, discitis and osteomyelitis Requesting provider: Dr. Alanda Slim  Principal Problem:   Discitis of lumbar region Active Problems:   Ureterolithiasis   Pyelonephritis of right kidney   Microcytic anemia   Hypokalemia   Discitis of lumbosacral region   Benign essential HTN   Antibiotics:  Vancomycin 4/14 Cefepime 4/14, ceftriaxone 4/14 Metronidazole 4/14  Lines/Hardware:  Assessment 62 Y O female with PMH as below including history of kidney stones, status post March admission with pyelonephritis secondary to obstructing stone s/p  cystoscopy with right ureteral stent placement on March 17, discharged on 14 days of cefadroxil ( 3/16 urine culture growing E. 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 coli in blood is likely the cause given recent urologic instrumentation and possible occult bacteremia  Monitor CBC and CMP  Rest of the management as per the primary team. Please call with questions or concerns.  Thank you for the consult  Odette Fraction, MD Infectious Disease Physician [facility]  [facility] Infect[facility] [address] [place], [place] [address] Phone: [phone]  Fax: [phone]  __________________________________________________________________________________________________________ HPI and Hospital Course: 71 Y O female with PMH as below including history of kidney stones with pyelonephritis who presented to ED on 4/13 with complaints of 2 weeks of rt sided LBP radiating to the abdomen. She had fevers 1 day PTA associated with nausea and small NBNB emesis at home. Denies dysuria.  Admitted end of March for concerns for pyelonephritis with obstructing stone and had cystoscopy with right ureteral stent placement on March 17.  Urine culture grew E. coli on 3/16.  She completed 14 days course of cefadroxil as prescribed during discharge on 3/23.  Overall she was doing well but since stent was placed, she was having intermittent low back and vague lower abdominal pain.  This was followed by lithotripsy with cystoscopy with right ureteral stent exchange on 3/27.  She eventually had her stent removed [place] weeks ago.  She was told her back pain would improve after stent removal but he did not and hence presented to the ED.   She reports a long history of kidney stones.  She is an ex-smoker, denies alcohol and IVDU.  She works at [employer] and lives alone   At ED , t max 100.9, hyper tensive, tachycardic Labs remarkable for WBC 9.8 Imaging  as below  Blood cx 4/14 2/2 sets GNR, BCID as E coli, no resistance markers  EDP discussed case with Neurosurgery and did not recommend neurosurgical intervention  ROS: General- Denies chills, loss of appetite and loss of weight HEENT - Denies headache, blurry vision, neck pain, sinus pain Chest - Denies any chest pain, SOB or cough CVS- Denies any dizziness/lightheadedness, syncopal attacks, palpitations Abdomen- Denies  hematochezia and diarrhea Neuro - Denies any weakness, numbness, tingling sensation Psych - Denies any  changes in mood irritability or depressive symptoms GU- Denies any burning, dysuria, hematuria or increased frequency of urination Skin - denies any rashes/lesions MSK - denies any joint pain/swelling or restricted ROM, lower back pain +   Past Medical History:  Diagnosis Date   Constipation    History of acute pyelonephritis 07/12/2022   secondary to uropathy obstruction due to kidney stones   History of kidney stones    Microcytic anemia 07/12/2022   Nephrolithiasis    left side calculi non-obstructive per CT 07-18-2022   Ureteral calculi 07/12/2022   right side   Wears contact lenses    Past Surgical History:  Procedure Laterality Date   APPENDECTOMY  age 51   CYSTOSCOPY WITH STENT PLACEMENT Right 02/10/2017   Procedure: CYSTOSCOPY WITH RIGHT STENT PLACEMENT;  Surgeon: Rene Paci, MD;  Location: [facility];  Service: Urology;  Laterality: Right;   CYSTOSCOPY/URETEROSCOPY/HOLMIUM LASER/STENT PLACEMENT Right 03/04/2017   Procedure: CYSTOSCOPY/URETEROSCOPY/HOLMIUM LASER/STENT PLACEMENT;  Surgeon: Rene Paci, MD;  Location[facility];  Service: Urology;  Laterality: Right;  NEEDS 30 MIN FOR PROCEDURE   CYSTOSCOPY/URETEROSCOPY/HOLMIUM LASER/STENT PLACEMENT Right 07/13/2022   Procedure: CYSTOSCOPY RIGHT URETERAL STENT PLACEMENT;  Surgeon: Crista Elliot, MD;  Location: [facility];  Service: Urology;  Laterality: Right;   CYSTOSCOPY/URETEROSCOPY/HOLMIUM LASER/STENT PLACEMENT Right 07/23/2022   Procedure: CYSTOSCOPY RIGHT URETEROSCOPY, HOLMIUM LASER LITHOTRIPSY, AND RIGHT URETERAL STENT PLACEMENT;  Surgeon: Rene Paci, MD;  Location: [facility];  Service: Urology;  Laterality: Right;  90 MINUTES   EXTRACORPOREAL SHOCK WAVE LITHOTRIPSY  2008     Scheduled Meds:  amLODipine  5 mg Oral Daily   docusate sodium  100 mg Oral BID   enoxaparin (LOVENOX) injection  40 mg Subcutaneous Q24H   Continuous Infusions:   cefTRIAXone (ROCEPHIN)  IV Stopped (08/10/22 2018)   PRN Meds:.albuterol, ibuprofen, metoprolol tartrate, ondansetron **OR** ondansetron (ZOFRAN) IV, oxyCODONE-acetaminophen, polyethylene glycol, traZODone  No Known Allergies  Social History   Socioeconomic History   Marital status: Divorced    Spouse name: Not on file   Number of children: Not on file   Years of education: Not on file   Highest education level: Not on file  Occupational History   Not on file  Tobacco Use   Smoking status: Former    Years: 20    Types: Cigarettes    Quit date: 02/25/2014    Years since quitting: 8.4   Smokeless tobacco: Never  Vaping Use   Vaping Use: Never used  Substance and Sexual Activity   Alcohol use: No   Drug use: No   Sexual activity: Not on file  Other Topics Concern   Not on file  Social History Narrative   Not on file   Social Determinants of Health   Financial Resource Strain: Not on file  Food Insecurity: No Food Insecurity (08/10/2022)   Hunger Vital Sign    Worried About Running Out of Food in the Last Year: Never true  Ran Out of Food in the Last Year: Never true  Transportation Needs: No Transportation Needs (08/10/2022)   PRAPARE - Administrator, Civil Service (Medical): No    Lack of Transportation (Non-Medical): No  Physical Activity: Not on file  Stress: Not on file  Social Connections: Not on file  Intimate Partner Violence: Not At Risk (08/10/2022)   Humiliation, Afraid, Rape, and Kick questionnaire    Fear of Current or Ex-Partner: No    Emotionally Abused: No    Physically Abused: No    Sexually Abused: No   History reviewed. No pertinent family history.   Vitals BP 130/60   Pulse (!) 54   Temp 98 F (36.7 C) (Oral)   Resp 18   Ht 6' (1.829 m)   Wt 54.7 kg   LMP 09/23/2015   SpO2 99%   BMI 16.36 kg/m    Physical Exam Constitutional: Adult female lying in the bed and appears comfortable    Comments: HEENT within normal  limit  Cardiovascular:     Rate and Rhythm: Normal rate and regular rhythm.     Heart sounds: S1 and S2  Pulmonary:     Effort: Pulmonary effort is normal on room air    Comments: Normal breath sounds  Abdominal:     Palpations: Abdomen is soft.     Tenderness: Nondistended and nontender  Musculoskeletal:        General: No swelling or tenderness in peripheral joints  Skin:    Comments: No rashes  Neurological:     General: Awake, alert and oriented.  Grossly nonfocal.  Power 5 x 5 in all extremities.  Ambulatory  Psychiatric:        Mood and Affect: Mood normal.    Pertinent Microbiology Results for orders placed or performed during the hospital encounter of 08/09/22  Culture, blood (routine x 2)     Status: None (Preliminary result)   Collection Time: 08/10/22 12:34 AM   Specimen: BLOOD LEFT FOREARM  Result Value Ref Range Status   Specimen Description   Final    BLOOD LEFT FOREARM Performed at [facility], [address] [place], [place] [address]    Special Requests   Final    BOTTLES DRAWN AEROBIC AND ANAEROBIC Blood Culture adequate volume Performed at [facility], [address], [place], [place] [address]    Culture  Setup Time   Final    GRAM NEGATIVE RODS ANAEROBIC BOTTLE ONLY CRITICAL VALUE NOTED.  VALUE IS CONSISTENT WITH PREVIOUSLY REPORTED AND CALLED VALUE. Performed at [facility], [address], [place], [place] [address]    Culture GRAM NEGATIVE RODS  Final   Report Status PENDING  Incomplete  Culture, blood (routine x 2)     Status: Abnormal (Preliminary result)   Collection Time: 08/10/22  1:31 AM   Specimen: BLOOD  Result Value Ref Range Status   Specimen Description BLOOD RIGHT ANTECUBITAL  Final   Special Requests   Final    BOTTLES DRAWN AEROBIC AND ANAEROBIC Blood Culture adequate volume   Culture  Setup Time   Final    GRAM NEGATIVE RODS IN BOTH AEROBIC AND ANAEROBIC BOTTLES CRITICAL  RESULT CALLED TO, READ BACK BY AND VERIFIED WITH: PHARMD Malva Cogan [id number] AT 1738 BY EC    Culture (A)  Final    ESCHERICHIA COLI SUSCEPTIBILITIES TO FOLLOW Performed at [facility], [address] [place], [place] [address]  Report Status PENDING  Incomplete  Blood Culture ID Panel (Reflexed)     Status: Abnormal   Collection Time: 08/10/22  1:31 AM  Result Value Ref Range Status   Enterococcus faecalis NOT DETECTED NOT DETECTED Final   Enterococcus Faecium NOT DETECTED NOT DETECTED Final   Listeria monocytogenes NOT DETECTED NOT DETECTED Final   Staphylococcus species NOT DETECTED NOT DETECTED Final   Staphylococcus aureus (BCID) NOT DETECTED NOT DETECTED Final   Staphylococcus epidermidis NOT DETECTED NOT DETECTED Final   Staphylococcus lugdunensis NOT DETECTED NOT DETECTED Final   Streptococcus species NOT DETECTED NOT DETECTED Final   Streptococcus agalactiae NOT DETECTED NOT DETECTED Final   Streptococcus pneumoniae NOT DETECTED NOT DETECTED Final   Streptococcus pyogenes NOT DETECTED NOT DETECTED Final   A.calcoaceticus-baumannii NOT DETECTED NOT DETECTED Final   Bacteroides fragilis NOT DETECTED NOT DETECTED Final   Enterobacterales DETECTED (A) NOT DETECTED Final    Comment: Enterobacterales represent a large order of gram negative bacteria, not a single organism. CRITICAL RESULT CALLED TO, READ BACK BY AND VERIFIED WITH: PHARMD Malva Cogan [id number] AT 1738 BY EC    Enterobacter cloacae complex NOT DETECTED NOT DETECTED Final   Escherichia coli DETECTED (A) NOT DETECTED Final    Comment: CRITICAL RESULT CALLED TO, READ BACK BY AND VERIFIED WITH: PHARMD Malva Cogan [id number] AT 1738 BY EC    Klebsiella aerogenes NOT DETECTED NOT DETECTED Final   Klebsiella oxytoca NOT DETECTED NOT DETECTED Final   Klebsiella pneumoniae NOT DETECTED NOT DETECTED Final   Proteus species NOT DETECTED NOT DETECTED Final   Salmonella species NOT DETECTED NOT DETECTED Final   Serratia  marcescens NOT DETECTED NOT DETECTED Final   Haemophilus influenzae NOT DETECTED NOT DETECTED Final   Neisseria meningitidis NOT DETECTED NOT DETECTED Final   Pseudomonas aeruginosa NOT DETECTED NOT DETECTED Final   Stenotrophomonas maltophilia NOT DETECTED NOT DETECTED Final   Candida albicans NOT DETECTED NOT DETECTED Final   Candida auris NOT DETECTED NOT DETECTED Final   Candida glabrata NOT DETECTED NOT DETECTED Final   Candida krusei NOT DETECTED NOT DETECTED Final   Candida parapsilosis NOT DETECTED NOT DETECTED Final   Candida tropicalis NOT DETECTED NOT DETECTED Final   Cryptococcus neoformans/gattii NOT DETECTED NOT DETECTED Final   CTX-M ESBL NOT DETECTED NOT DETECTED Final   Carbapenem resistance IMP NOT DETECTED NOT DETECTED Final   Carbapenem resistance KPC NOT DETECTED NOT DETECTED Final   Carbapenem resistance NDM NOT DETECTED NOT DETECTED Final   Carbapenem resist OXA 48 LIKE NOT DETECTED NOT DETECTED Final   Carbapenem resistance VIM NOT DETECTED NOT DETECTED Final    Comment: Performed at [facility], [address], [place], [place] [address]   Pertinent Lab seen by me:    Latest Ref Rng & Units 08/11/2022    3:17 AM 08/10/2022    2:59 PM 08/09/2022    9:54 PM  CBC  WBC 4.0 - 10.5 K/uL 8.5  11.8  9.8   Hemoglobin 12.0 - 15.0 g/dL 7.5  7.4  8.5   Hematocrit 36.0 - 46.0 % 24.8  24.9  28.4   Platelets 150 - 400 K/uL 304  346  384       Latest Ref Rng & Units 08/11/2022    3:17 AM 08/10/2022    2:59 PM 08/09/2022    9:54 PM  CMP  Glucose 70 - 99 mg/dL 956   213   BUN 6 - 20 mg/dL 18   17  Creatinine 0.44 - 1.00 mg/dL 1.61  0.96  0.45   Sodium 135 - 145 mmol/L 137   133   Potassium 3.5 - 5.1 mmol/L 3.5   3.0   Chloride 98 - 111 mmol/L 104   101   CO2 22 - 32 mmol/L 25   23   Calcium 8.9 - 10.3 mg/dL 9.0   8.8   Total Protein 6.5 - 8.1 g/dL   8.2   Total Bilirubin 0.3 - 1.2 mg/dL   0.6   Alkaline Phos 38 - 126 U/L   161   AST 15 - 41 U/L   17   ALT 0  - 44 U/L   18     Pertinent Imagings/Other Imagings Plain films and CT images have been personally visualized and interpreted; radiology reports have been reviewed. Decision making incorporated into the Impression / Recommendations.  MR Lumbar Spine W Wo Contrast  Result Date: 08/10/2022 CLINICAL DATA:  Lower back pain with infection suspected. Positive x-ray EXAM: MRI LUMBAR SPINE WITHOUT AND WITH CONTRAST TECHNIQUE: Multiplanar and multiecho pulse sequences of the lumbar spine were obtained without and with intravenous contrast. CONTRAST:  6mL GADAVIST GADOBUTROL 1 MMOL/ML IV SOLN COMPARISON:  Abdominal CT from earlier today FINDINGS: Segmentation:  5 lumbar type vertebrae Alignment: Straightening of lumbar lordosis with slight L3-4 anterolisthesis Vertebrae: Marrow edema and endplate destruction at L5-S1 with paravertebral inflammation. The disc space is nonenhancing, likely purulence. Ventral epidural enhancement without collection. Partially covered corner edema at T12 anteriorly and superiorly. Conus medullaris and cauda equina: Conus extends to the L1 level. Conus and cauda equina appear normal. Paraspinal and other soft tissues: As above Disc levels: Generalized disc desiccation and narrowing with bulging. Limited facet degeneration. Disc bulging and height loss at L5-S1 causes moderate biforaminal stenosis. IMPRESSION: 1. 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 No spinal canal abscess. 2. Partially covered inflammation at the T12 anterior superior corner, likely additional site of discitis at T11-12 based on preceding CT. Electronically Signed   By: Tiburcio Pea M.D.   On: 08/10/2022 08:42   CT ABDOMEN PELVIS W CONTRAST  Result Date: 08/10/2022 CLINICAL DATA:  Sepsis, right flank pain, left lower quadrant pain, and fever. Recent pyelonephritis with kidney stone. EXAM: CT ABDOMEN AND PELVIS WITH CONTRAST TECHNIQUE: Multidetector CT imaging of  the abdomen and pelvis was performed using the standard protocol following bolus administration of intravenous contrast. RADIATION DOSE REDUCTION: This exam was performed according to the departmental dose-optimization program which includes automated exposure control, adjustment of the mA and/or kV according to patient size and/or use of iterative reconstruction technique. CONTRAST:  OMNIPAQUE IOHEXOL 300 MG/ML  SOLN COMPARISON:  07/18/2022. FINDINGS: Lower chest: The heart is enlarged. There is atelectasis at the lung bases. A 5 mm pleural-based nodule is present in the left lower lobe, axial image 1. Hepatobiliary: No focal liver abnormality is seen. No gallstones, gallbladder wall thickening, or biliary dilatation. Pancreas: Unremarkable. No pancreatic ductal dilatation or surrounding inflammatory changes. Spleen: Spleen is normal in size. Scattered subcentimeter hypodensities are present in the spleen, possible cysts or hemangiomas. Adrenals/Urinary Tract: The adrenal glands are within normal limits. The kidneys enhance symmetrically. Nonobstructive renal calculi are noted bilaterally. There is a cyst in the upper pole of the right kidney. No hydroureteronephrosis bilaterally. The bladder is within normal limits. Stomach/Bowel: Stomach is within normal limits. Appendix is not seen. No evidence of bowel wall thickening, distention, or inflammatory changes. No free air or  pneumatosis. Vascular/Lymphatic: Aortic atherosclerosis. A nonspecific enlarged lymph node is present along the iliac chain on the right measuring 1.1 cm. Reproductive: Uterus and bilateral adnexa are unremarkable. Other: No abdominopelvic ascites. Musculoskeletal: Degenerative changes are present in the thoracolumbar spine. There are bony erosions along the endplates at L5-S1 with increased soft tissue density in the region and likely disc herniation. No acute fracture. IMPRESSION: 1. Bilateral nephrolithiasis with no obstructive uropathy  bilaterally. 2. New erosions at the inferior endplate at L5 and superior endplate as S1 with with surrounding soft tissue thickening, concerning for osteomyelitis/discitis. MRI with contrast is recommended for further evaluation. 3. 5 mm pleural-based nodule in the left lower lobe. No follow-up needed if patient is low-risk.This recommendation follows the consensus statement: Guidelines for Management of Incidental Pulmonary Nodules Detected on CT Images: From the Fleischner Society 2017; Radiology 2017; 284:228-243. Electronically Signed   By: Thornell Sartorius M.D.   On: 08/10/2022 01:38   CT ABDOMEN PELVIS W CONTRAST  Result Date: 07/18/2022 CLINICAL DATA:  History of right flank pain and hematuria, urinary calculi, stent placement EXAM: CT ABDOMEN AND PELVIS WITH CONTRAST TECHNIQUE: Multidetector CT imaging of the abdomen and pelvis was performed using the standard protocol following bolus administration of intravenous contrast. RADIATION DOSE REDUCTION: This exam was performed according to the departmental dose-optimization program which includes automated exposure control, adjustment of the mA and/or kV according to patient size and/or use of iterative reconstruction technique. CONTRAST:  OMNIPAQUE IOHEXOL 300 MG/ML  SOLN COMPARISON:  07/12/2022, 07/16/2022 FINDINGS: Lower chest: Trace bilateral pleural effusions and dependent lower lobe atelectasis. Hepatobiliary: No focal liver abnormality is seen. No gallstones, gallbladder wall thickening, or biliary dilatation. Pancreas: Unremarkable. No pancreatic ductal dilatation or surrounding inflammatory changes. Spleen: Normal in size without focal abnormality. Adrenals/Urinary Tract: Since the prior exam, a right ureteral stent has been placed, proximal aspect coiled at the right UPJ and distal aspect coiled within the bladder lumen. There is a persistent 7 mm calculus within the distal right ureter reference image 76/2. Two of the other distal right  ureteral calculi seen on the previous exam have been displaced proximally, with an 8 mm calculus seen in the proximal right ureter reference image 46/2 and a 6 mm curvilinear calculus within the right renal pelvis reference image 37/2. There is persistent mild right-sided hydronephrosis. Striated right-sided nephrogram and mucosal enhancement of the right renal pelvis consistent with superimposed infection and pyelonephritis. No evidence of renal abscess. Stable benign right renal cyst does not require follow-up. Stable appearance of the left kidney, with punctate nonobstructing left renal calculi identified. No obstruction. The adrenals and bladder are unremarkable. Stomach/Bowel: No bowel obstruction or ileus. No bowel wall thickening or inflammatory change. Vascular/Lymphatic: Stable aortic atherosclerosis. Subcentimeter retroperitoneal lymph nodes are stable. No pathologic adenopathy. Reproductive: Uterus and bilateral adnexa are unremarkable. Other: Trace free fluid within the pelvis. No free intraperitoneal gas. Small fat containing right periumbilical hernia. Musculoskeletal: No acute or destructive bony lesions. Reconstructed images demonstrate no additional findings. IMPRESSION: 1. Interval placement of a right ureteral stent as above. 2 of the previously identified obstructing distal right ureteral calculi have migrated proximally, one within the right renal pelvis and a second within the proximal right ureter adjacent to the indwelling stent. The third calculus remains lodged within the distal right ureter just proximal to the UVJ. 2. Persistent mild right hydronephrosis, with abnormal striated nephrogram of the right kidney and right renal pelvis mucosal enhancement consistent with superimposed infection and pyelonephritis. No evidence  of renal abscess. 3. Trace pelvic free fluid. 4. Stable punctate nonobstructing left renal calculi. 5. Trace bilateral pleural effusions with minimal dependent lower lobe  atelectasis. 6.  Aortic Atherosclerosis (ICD10-I70.0). Electronically Signed   By: Sharlet Salina M.D.   On: 07/18/2022 10:49   US RENAL  Result Date: 07/16/2022 CLINICAL DATA:  Hydronephrosis EXAM: RENAL / URINARY TRACT ULTRASOUND COMPLETE COMPARISON:  CT 07/12/2022 FINDINGS: Right Kidney: Renal measurements: 13.4 x 5.7 x 6.3 = volume: 250.3 mL. Heterogeneous echotexture. Mild perinephric fluid. Ectatic appearance of the renal pelvis and proximal ureter. Similar to the prior CT scan. Note is also made of an anechoic structure of the right kidney measuring 3.2 x 2.6 x 3.0 cm with smooth margins and through transmission consistent with a Bosniak 1 cyst. Left Kidney: Renal measurements: 12.8 x 6.5 x 7.2 = volume: 312.7 mL. No collecting system dilatation. Mild perinephric fluid Bladder: Bladder is mildly distended with fluid.  Stent in place. Other: None. IMPRESSION: Slight ectasia of the right renal pelvis. Distended bladder.  Stent in the bladder. Slightly heterogeneous right renal echotexture. Electronically Signed   By: Karen Kays M.D.   On: 07/16/2022 17:46   DG C-Arm 1-60 Min-No Report  Result Date: 07/13/2022 Fluoroscopy was utilized by the requesting physician.  No radiographic interpretation.   US Abdomen Limited RUQ (LIVER/GB)  Result Date: 07/12/2022 CLINICAL DATA:  Elevated LFTs EXAM: ULTRASOUND ABDOMEN LIMITED RIGHT UPPER QUADRANT COMPARISON:  CT today FINDINGS: Gallbladder: No gallstones or wall thickening visualized. No sonographic Murphy sign noted by sonographer. Common bile duct: Diameter: Normal caliber, 2 mm Liver: No focal lesion identified. Within normal limits in parenchymal echogenicity. Portal vein is patent on color Doppler imaging with normal direction of blood flow towards the liver. Other: None. IMPRESSION: Unremarkable study Electronically Signed   By: Charlett Nose M.D.   On: 07/12/2022 19:19   CT ABDOMEN PELVIS W CONTRAST  Result Date: 07/12/2022 CLINICAL DATA:   Hematuria EXAM: CT ABDOMEN AND PELVIS WITH CONTRAST TECHNIQUE: Multidetector CT imaging of the abdomen and pelvis was performed using the standard protocol following bolus administration of intravenous contrast. RADIATION DOSE REDUCTION: This exam was performed according to the departmental dose-optimization program which includes automated exposure control, adjustment of the mA and/or kV according to patient size and/or use of iterative reconstruction technique. CONTRAST:  100 cc Omnipaque 300 intravenous COMPARISON:  CT 10/18/2019 FINDINGS: Lower chest: Lung bases demonstrate no acute airspace disease. Borderline to mild cardiomegaly with trace pericardial effusion. Hepatobiliary: No focal liver abnormality is seen. No gallstones, gallbladder wall thickening, or biliary dilatation. Pancreas: Unremarkable. No pancreatic ductal dilatation or surrounding inflammatory changes. Spleen: Subcentimeter hypodensities in the spleen too small to further characterize but probably benign Adrenals/Urinary Tract: Adrenal glands are within normal limits. 3.6 cm cyst midpole right kidney, no imaging follow-up is recommended. Several punctate intrarenal stones on the left. Mild left renal pelvis dilatation with slight urothelial enhancement on delayed views. Minimal right hydronephrosis and slightly prominent right ureter, secondary to at least 3 stacked stones in the distal right ureter ovary 2.5 cm craniocaudal length. The largest stone measures 10 mm. There is right perinephric fat stranding. Delayed views demonstrate areas of heterogenous nephrogram on the right suspicious for pyelonephritis. Thick-walled appearance of right renal pelvis. Small focus of air in the bladder, correlate for recent instrumentation. No significant excretion of contrast on delayed views consistent with decreased renal function. Stomach/Bowel: The stomach is nonenlarged. No dilated small bowel. No acute bowel wall thickening. Vascular/Lymphatic:  Moderate aortic atherosclerosis. No aneurysm. No suspicious lymph nodes. Reproductive: Uterus unremarkable.  No suspicious adnexal mass. Other: Clips or coils in the right pelvis. No free air. No significant free fluid. Small fat containing right periumbilical hernia. Musculoskeletal: No acute or suspicious osseous abnormality IMPRESSION: 1. Minimal right hydronephrosis and hydroureter, secondary to at least 3 stacked stones in the distal right ureter measuring up to 2.5 cm in craniocaudal length. The largest stone measures 10 mm. This is seen several cm proximal to the right UVJ. There is right perinephric fat stranding. Heterogeneous nephrogram of the right kidney on delayed views is suspect for pyelonephritis. Urothelial thickening of right renal pelvis and mild enhancement of left renal pelvis could be inflammatory or due to upper urinary tract infection. 2. Poor excretion of contrast from both kidneys on delayed views suggesting decreased renal function, correlate with appropriate laboratory values 3. Multiple punctate left intrarenal stones. Small focus of air in the bladder, correlate for recent instrumentation. 4. Aortic atherosclerosis. Aortic Atherosclerosis (ICD10-I70.0). Electronically Signed   By: Jasmine Pang M.D.   On: 07/12/2022 19:10     I spent at least 80 minutes for this patient encounter including review of prior medical records/discussing diagnostics and treatment plan with the patient/family/coordinate care with primary/other specialits with greater than 50% of time in face to face encounter.   Electronically signed by:   Odette Fraction, MD Infectious Disease Physician [facility] Pager: [phone]

## 2022-08-11 NOTE — Progress Notes (Signed)
PROGRESS NOTE  Denise Hudson BEE:[id number] DOB: 1961/03/01   PCP: Pcp, No  Patient is from: Home.  Lives at home.  Independently ambulates at baseline.  DOA: 08/09/2022 LOS: 1  Chief complaints Chief Complaint  Patient presents with   Abdominal Pain   Back Pain     Brief Narrative / Interim history: 62 year old F with PMH of HTN, nephrolithiasis with recent hospitalization for pyelonephritis and obstructive uropathy s/p stent placement followed by removal 2 weeks ago presenting with worsening low back pain with associated fever, nausea and vomiting for 2 to 3 days.  Reportedly had lingering back pain since she was hospitalized for obstructive apathy that did not resolve completely despite stent removal and finishing antibiotic course.  In ED, febrile to 100.9.  BP slightly elevated.  WBC 9.8.  Hgb 8.5 (9.8 on 3/23).  MCV 74.7. Na 133.  K3.0.  LA 0.9.  UA with large LE, small Hgb and rare bacteria.  CT abdomen and pelvis with nonobstructive bilateral nephrolithiasis, new erosion at the inferior endplate of L5 and superior endplate of S1 with surrounding soft tissue thickening concerning for osteomyelitis/discitis and 5 mm pleural-based nodule in LLL.   Lab work was done which shows stable chronic microcytic anemia, no leukocytosis.  CT scan of the abdomen and pelvis, and later lumbar MRI confirms evidence of L5-S1 discitis/osteomyelitis with phlegmon but no obvious abscess.  The patient was given empiric IV cefepime, Flagyl, and vancomycin.  ER provider discussed with neurosurgery on-call, who recommends medical management with IV antibiotics for now, no surgical intervention indicated.  MRI lumbar spine concerning for discitis/osteomyelitis at L5-S1 with disc space purulence and ventral epidural phlegmon but no spinal canal abscess as well as partially covered inflammation at T12 anterior superior corner concerning for additional site of discitis at T11-12.  Neurosurgery consulted and did not  feel there is need for surgery and recommended IV antibiotics.  Blood and urine cultures obtained.  Patient was started on broad-spectrum antibiotics and admitted.  Blood culture with E. Coli.  ID consulted.     Subjective: Seen and examined earlier this morning.  No major events overnight of this morning.  Somewhat bradycardic but not symptomatic.  She reports some back pain but denies radiation to her legs.  Denies numbness or tingling.  Denies bowel or bladder habit change.   Objective: Vitals:   08/10/22 2336 08/11/22 0323 08/11/22 0740 08/11/22 0900  BP: 122/67 (!) 124/53 (!) 132/59 130/60  Pulse: (!) 47 (!) 51 (!) 44 (!) 54  Resp: 16 16 18    Temp: 98 F (36.7 C) 98.2 F (36.8 C) 98 F (36.7 C)   TempSrc: Oral Oral Oral   SpO2: 100% 100% 96% 99%  Weight:      Height:        Examination:  GENERAL: No apparent distress.  Nontoxic. HEENT: MMM.  Vision and hearing grossly intact.  NECK: Supple.  No apparent JVD.  RESP:  No IWOB.  Fair aeration bilaterally. CVS: Bradycardic to 50s.  Regular rhythm.  Heart sounds normal.  ABD/GI/GU: BS+. Abd soft, NTND.  MSK/EXT:  Moves extremities.  Significant muscle mass and subcu fat loss. SKIN: no apparent skin lesion or wound NEURO: Awake, alert and oriented appropriately.  No apparent focal neuro deficit. PSYCH: Calm. Normal affect.   Procedures:  None  Microbiology summarized: Blood culture with E. Coli Urine culture NGTD  Assessment and plan: Principal Problem:   Discitis of lumbar region Active Problems:   Ureterolithiasis  Pyelonephritis of right kidney   Microcytic anemia   Hypokalemia   Discitis of lumbosacral region   Benign essential HTN   E coli bacteremia   Discitis of thoracic region   Underweight  E. coli bacteremia-blood culture with E. coli.  Urinary source from recent obstructive UTI?  Urine culture NGTD Discitis of lumbosacral and thoracic region: I am not sure if this is related to the above.  Unusual  to see E. coli discitis -Neurosurgery consulted in ED and did not feel there is need for surgery, and recommended antibiotics. -Antibiotic de-escalated to IV ceftriaxone overnight -ID consulted -Follow culture sensitivity  Nonobstructive bilateral nephrolithiasis: Patient with recent pyelo-, nephrolithiasis and obstructive uropathy with E. coli for which she completed treatment.  Urine culture NGTD -Outpatient follow-up with urology  Microcytic anemia: Hgb slowly drifting down.  No obvious bleeding. Recent Labs    07/12/22 1710 07/13/22 0447 07/16/22 0441 07/17/22 0456 07/18/22 0500 07/19/22 0438 08/09/22 2154 08/10/22 1459 08/11/22 0317  HGB 11.6* 10.9* 9.0* 9.4* 8.7* 9.8* 8.5* 7.4* 7.5*  -Check anemia panel -Continue monitoring  Essential hypertension: BP improved. -Continue home amlodipine -P.o. hydralazine as needed  Sinus bradycardia: Not on nodal blocking agent. -Check EKG and TSH -Avoid nodal blocking agents -Optimize ultralights  Hyponatremia: Resolved  Hypokalemia: Improved -P.o. KCl 40 x 1 -Check magnesium  Underweight/unintentional weight loss Wt Readings from Last 10 Encounters:  08/10/22 54.7 kg  07/23/22 60.7 kg  07/17/22 68.7 kg  03/04/17 65.1 kg  09/23/15 65.8 kg  Body mass index is 16.36 kg/m. -Consult dietitian          DVT prophylaxis:  enoxaparin (LOVENOX) injection 40 mg Start: 08/10/22 2200 SCDs Start: 08/10/22 1151  Code Status: Full code Family Communication: None at bedside Level of care: Med-Surg Status is: Inpatient Remains inpatient appropriate because: E. coli bacteremia, lumbosacral and thoracic discitis/possible osteomyelitis   Final disposition: TBD Consultants:  Neurosurgery Infectious disease  55 minutes with more than 50% spent in reviewing records, counseling patient/family and coordinating care.   Sch Meds:  Scheduled Meds:  amLODipine  5 mg Oral Daily   docusate sodium  100 mg Oral BID   enoxaparin  (LOVENOX) injection  40 mg Subcutaneous Q24H   Continuous Infusions:  cefTRIAXone (ROCEPHIN)  IV Stopped (08/10/22 2018)   PRN Meds:.albuterol, hydrALAZINE, ibuprofen, metoprolol tartrate, ondansetron **OR** ondansetron (ZOFRAN) IV, oxyCODONE-acetaminophen, polyethylene glycol, traZODone  Antimicrobials: Anti-infectives (From admission, onward)    Start     Dose/Rate Route Frequency Ordered Stop   08/11/22 0600  vancomycin (VANCOCIN) IVPB 1000 mg/200 mL premix  Status:  Discontinued        1,000 mg 200 mL/hr over 60 Minutes Intravenous Every 24 hours 08/10/22 1516 08/10/22 1808   08/11/22 0500  vancomycin (VANCOREADY) IVPB 1250 mg/250 mL  Status:  Discontinued        1,250 mg 166.7 mL/hr over 90 Minutes Intravenous Every 24 hours 08/10/22 1317 08/10/22 1516   08/10/22 2100  ceFEPIme (MAXIPIME) 2 g in sodium chloride 0.9 % 100 mL IVPB  Status:  Discontinued        2 g 200 mL/hr over 30 Minutes Intravenous Every 12 hours 08/10/22 1514 08/10/22 1808   08/10/22 1900  cefTRIAXone (ROCEPHIN) 2 g in sodium chloride 0.9 % 100 mL IVPB        2 g 200 mL/hr over 30 Minutes Intravenous Every 24 hours 08/10/22 1808     08/10/22 1800  ceFEPIme (MAXIPIME) 2 g in sodium chloride 0.9 %  100 mL IVPB  Status:  Discontinued        2 g 200 mL/hr over 30 Minutes Intravenous Every 8 hours 08/10/22 1301 08/10/22 1514   08/10/22 1200  ceFEPIme (MAXIPIME) 1 g in sodium chloride 0.9 % 100 mL IVPB  Status:  Discontinued        1 g 200 mL/hr over 30 Minutes Intravenous Every 8 hours 08/10/22 1155 08/10/22 1300   08/10/22 0915  vancomycin (VANCOCIN) IVPB 1000 mg/200 mL premix        1,000 mg 200 mL/hr over 60 Minutes Intravenous  Once 08/10/22 0905 08/10/22 1050   08/10/22 0900  ceFEPIme (MAXIPIME) 2 g in sodium chloride 0.9 % 100 mL IVPB        2 g 200 mL/hr over 30 Minutes Intravenous  Once 08/10/22 0851 08/10/22 0940   08/10/22 0900  metroNIDAZOLE (FLAGYL) IVPB 500 mg  Status:  Discontinued        500  mg 100 mL/hr over 60 Minutes Intravenous Every 12 hours 08/10/22 0851 08/10/22 1808        I have personally reviewed the following labs and images: CBC: Recent Labs  Lab 08/09/22 2154 08/10/22 1459 08/11/22 0317  WBC 9.8 11.8* 8.5  NEUTROABS 7.4  --   --   HGB 8.5* 7.4* 7.5*  HCT 28.4* 24.9* 24.8*  MCV 74.7* 74.6* 73.4*  PLT 384 346 304   BMP &GFR Recent Labs  Lab 08/09/22 2154 08/10/22 1459 08/11/22 0317  NA 133*  --  137  K 3.0*  --  3.5  CL 101  --  104  CO2 23  --  25  GLUCOSE 126*  --  107*  BUN 17  --  18  CREATININE 0.94 0.90 0.84  CALCIUM 8.8*  --  9.0   Estimated Creatinine Clearance: 61.5 mL/min (by C-G formula based on SCr of 0.84 mg/dL). Liver & Pancreas: Recent Labs  Lab 08/09/22 2154  AST 17  ALT 18  ALKPHOS 161*  BILITOT 0.6  PROT 8.2*  ALBUMIN 2.7*   Recent Labs  Lab 08/09/22 2154  LIPASE 25   No results for input(s): "AMMONIA" in the last 168 hours. Diabetic: No results for input(s): "HGBA1C" in the last 72 hours. No results for input(s): "GLUCAP" in the last 168 hours. Cardiac Enzymes: No results for input(s): "CKTOTAL", "CKMB", "CKMBINDEX", "TROPONINI" in the last 168 hours. No results for input(s): "PROBNP" in the last 8760 hours. Coagulation Profile: No results for input(s): "INR", "PROTIME" in the last 168 hours. Thyroid Function Tests: No results for input(s): "TSH", "T4TOTAL", "FREET4", "T3FREE", "THYROIDAB" in the last 72 hours. Lipid Profile: No results for input(s): "CHOL", "HDL", "LDLCALC", "TRIG", "CHOLHDL", "LDLDIRECT" in the last 72 hours. Anemia Panel: No results for input(s): "VITAMINB12", "FOLATE", "FERRITIN", "TIBC", "IRON", "RETICCTPCT" in the last 72 hours. Urine analysis:    Component Value Date/Time   COLORURINE YELLOW 08/09/2022 2159   APPEARANCEUR HAZY (A) 08/09/2022 2159   LABSPEC 1.018 08/09/2022 2159   PHURINE 5.0 08/09/2022 2159   GLUCOSEU NEGATIVE 08/09/2022 2159   HGBUR SMALL (A) 08/09/2022  2159   BILIRUBINUR NEGATIVE 08/09/2022 2159   KETONESUR NEGATIVE 08/09/2022 2159   PROTEINUR 30 (A) 08/09/2022 2159   UROBILINOGEN 1.0 01/02/2015 0043   NITRITE NEGATIVE 08/09/2022 2159   LEUKOCYTESUR LARGE (A) 08/09/2022 2159   Sepsis Labs: Invalid input(s): "PROCALCITONIN", "LACTICIDVEN"  Microbiology: Recent Results (from the past 240 hour(s))  Urine Culture     Status: None   Collection Time:  08/09/22 11:57 PM   Specimen: Urine, Clean Catch  Result Value Ref Range Status   Specimen Description   Final    URINE, CLEAN CATCH Performed at [facility], [address] [place], [place] [address]    Special Requests   Final    NONE Performed at [facility], [address], [place], [place] [address]    Culture   Final    NO GROWTH Performed at [facility], [address], [place], [place] [address]    Report Status 08/11/2022 FINAL  Final  Culture, blood (routine x 2)     Status: None (Preliminary result)   Collection Time: 08/10/22 12:34 AM   Specimen: BLOOD LEFT FOREARM  Result Value Ref Range Status   Specimen Description   Final    BLOOD LEFT FOREARM Performed at [facility], [address], [place], [place] [address]    Special Requests   Final    BOTTLES DRAWN AEROBIC AND ANAEROBIC Blood Culture adequate volume Performed at [facility], [address] [place], [place] [address]    Culture  Setup Time   Final    GRAM NEGATIVE RODS ANAEROBIC BOTTLE ONLY CRITICAL VALUE NOTED.  VALUE IS CONSISTENT WITH PREVIOUSLY REPORTED AND CALLED VALUE. Performed at [facility], [address], [place], [place] [address]    Culture GRAM NEGATIVE RODS  Final   Report Status PENDING  Incomplete  Culture, blood (routine x 2)     Status: Abnormal (Preliminary result)   Collection Time: 08/10/22  1:31 AM   Specimen: BLOOD  Result Value Ref Range Status   Specimen Description BLOOD RIGHT  ANTECUBITAL  Final   Special Requests   Final    BOTTLES DRAWN AEROBIC AND ANAEROBIC Blood Culture adequate volume   Culture  Setup Time   Final    GRAM NEGATIVE RODS IN BOTH AEROBIC AND ANAEROBIC BOTTLES CRITICAL RESULT CALLED TO, READ BACK BY AND VERIFIED WITH: PHARMD Malva Cogan [id number] AT 1738 BY EC    Culture (A)  Final    ESCHERICHIA COLI SUSCEPTIBILITIES TO FOLLOW Performed at [facility], [address] [place], [place] [address]    Report Status PENDING  Incomplete  Blood Culture ID Panel (Reflexed)     Status: Abnormal   Collection Time: 08/10/22  1:31 AM  Result Value Ref Range Status   Enterococcus faecalis NOT DETECTED NOT DETECTED Final   Enterococcus Faecium NOT DETECTED NOT DETECTED Final   Listeria monocytogenes NOT DETECTED NOT DETECTED Final   Staphylococcus species NOT DETECTED NOT DETECTED Final   Staphylococcus aureus (BCID) NOT DETECTED NOT DETECTED Final   Staphylococcus epidermidis NOT DETECTED NOT DETECTED Final   Staphylococcus lugdunensis NOT DETECTED NOT DETECTED Final   Streptococcus species NOT DETECTED NOT DETECTED Final   Streptococcus agalactiae NOT DETECTED NOT DETECTED Final   Streptococcus pneumoniae NOT DETECTED NOT DETECTED Final   Streptococcus pyogenes NOT DETECTED NOT DETECTED Final   A.calcoaceticus-baumannii NOT DETECTED NOT DETECTED Final   Bacteroides fragilis NOT DETECTED NOT DETECTED Final   Enterobacterales DETECTED (A) NOT DETECTED Final    Comment: Enterobacterales represent a large order of gram negative bacteria, not a single organism. CRITICAL RESULT CALLED TO, READ BACK BY AND VERIFIED WITH: PHARMD Malva Cogan [id number] AT 1738 BY EC    Enterobacter cloacae complex NOT DETECTED NOT DETECTED Final   Escherichia coli DETECTED (A) NOT DETECTED Final    Comment: CRITICAL  RESULT CALLED TO, READ BACK BY AND VERIFIED WITH: PHARMD Malva Cogan [id number] AT 1738 BY EC    Klebsiella aerogenes NOT DETECTED NOT DETECTED Final    Klebsiella oxytoca NOT DETECTED NOT DETECTED Final   Klebsiella pneumoniae NOT DETECTED NOT DETECTED Final   Proteus species NOT DETECTED NOT DETECTED Final   Salmonella species NOT DETECTED NOT DETECTED Final   Serratia marcescens NOT DETECTED NOT DETECTED Final   Haemophilus influenzae NOT DETECTED NOT DETECTED Final   Neisseria meningitidis NOT DETECTED NOT DETECTED Final   Pseudomonas aeruginosa NOT DETECTED NOT DETECTED Final   Stenotrophomonas maltophilia NOT DETECTED NOT DETECTED Final   Candida albicans NOT DETECTED NOT DETECTED Final   Candida auris NOT DETECTED NOT DETECTED Final   Candida glabrata NOT DETECTED NOT DETECTED Final   Candida krusei NOT DETECTED NOT DETECTED Final   Candida parapsilosis NOT DETECTED NOT DETECTED Final   Candida tropicalis NOT DETECTED NOT DETECTED Final   Cryptococcus neoformans/gattii NOT DETECTED NOT DETECTED Final   CTX-M ESBL NOT DETECTED NOT DETECTED Final   Carbapenem resistance IMP NOT DETECTED NOT DETECTED Final   Carbapenem resistance KPC NOT DETECTED NOT DETECTED Final   Carbapenem resistance NDM NOT DETECTED NOT DETECTED Final   Carbapenem resist OXA 48 LIKE NOT DETECTED NOT DETECTED Final   Carbapenem resistance VIM NOT DETECTED NOT DETECTED Final    Comment: Performed at [facility], [address] [place], [place] [address]    Radiology Studies: No results found.    Alla Sloma T. Loic Hobin [facility]  If 7PM-7AM, please contact night-coverage [email or web address] 08/11/2022, 10:32 AM

## 2022-08-11 NOTE — TOC Initial Note (Signed)
Transition of Care [facility]) - Initial/Assessment Note    Patient Details  Name: Denise Hudson MRN: [id number] Date of Birth: 25-Mar-1961  [facility] CM/SW Contact:    Kermit Balo, RN Phone Number: 08/11/2022, 2:44 PM  Clinical Narrative:                 Pt is from home alone. She states her nieces can check on her.  She drives herself as needed.  She manages her own medications and denies any issues.  Pharmacy: outpt pharmacy at [facility] is without a PCP. CM was able to get her an appointment with [facility]. Information on the AVS.  Pt's family will transport home when medically ready.   Expected Discharge Plan: Home/Self Care Barriers to Discharge: Continued Medical Work up   Patient Goals and CMS Choice            Expected Discharge Plan and Services   Discharge Planning Services: CM Consult   Living arrangements for the past 2 months: Single Family Home                                      Prior Living Arrangements/Services Living arrangements for the past 2 months: Single Family Home Lives with:: Self Patient language and need for interpreter reviewed:: Yes Do you feel safe going back to the place where you live?: Yes      Need for Family Participation in Patient Care: No (Comment)     Criminal Activity/Legal Involvement Pertinent to Current Situation/Hospitalization: No - Comment as needed  Activities of Daily Living Home Assistive Devices/Equipment: None ADL Screening (condition at time of admission) Patient's cognitive ability adequate to safely complete daily activities?: Yes Is the patient deaf or have difficulty hearing?: No Does the patient have difficulty seeing, even when wearing glasses/contacts?: No Does the patient have difficulty concentrating, remembering, or making decisions?: No Patient able to express need for assistance with ADLs?: Yes Does the patient have difficulty dressing or bathing?:  No Independently performs ADLs?: Yes (appropriate for developmental age) Does the patient have difficulty walking or climbing stairs?: Yes Weakness of Legs: Both Weakness of Arms/Hands: None  Permission Sought/Granted                  Emotional Assessment Appearance:: Appears stated age Attitude/Demeanor/Rapport: Engaged Affect (typically observed): Accepting Orientation: : Oriented to Self, Oriented to Place, Oriented to  Time, Oriented to Situation   Psych Involvement: No (comment)  Admission diagnosis:  Discitis of lumbosacral region [M46.47] Patient Active Problem List   Diagnosis Date Noted   E coli bacteremia 08/11/2022   Discitis of thoracic region 08/11/2022   Underweight 08/11/2022   Discitis of lumbar region 08/10/2022   Hypokalemia 08/10/2022   Discitis of lumbosacral region 08/10/2022   Benign essential HTN 08/10/2022   Transaminitis 07/16/2022   Microcytic anemia 07/16/2022   Ureterolithiasis 07/12/2022   Pyelonephritis of right kidney 07/12/2022   Nephrolith 02/10/2017   PCP:  Pcp, No Pharmacy:   [facility] [address] [place] [address] Phone: [phone] Fax: [phone]     Social Determinants of Health (SDOH) Social History: SDOH Screenings   Food Insecurity: No Food Insecurity (08/10/2022)  Housing: Low Risk  (08/10/2022)  Transportation Needs: No Transportation Needs (08/10/2022)  Utilities: Not At Risk (08/10/2022)  Tobacco Use: Medium Risk (08/09/2022)  SDOH Interventions:     Readmission Risk Interventions     No data to display

## 2022-08-12 ENCOUNTER — Inpatient Hospital Stay (HOSPITAL_COMMUNITY): Payer: BC Managed Care – PPO

## 2022-08-12 DIAGNOSIS — M4647 Discitis, unspecified, lumbosacral region: Secondary | ICD-10-CM | POA: Diagnosis not present

## 2022-08-12 DIAGNOSIS — N201 Calculus of ureter: Secondary | ICD-10-CM | POA: Diagnosis not present

## 2022-08-12 DIAGNOSIS — M4646 Discitis, unspecified, lumbar region: Secondary | ICD-10-CM | POA: Diagnosis not present

## 2022-08-12 DIAGNOSIS — N12 Tubulo-interstitial nephritis, not specified as acute or chronic: Secondary | ICD-10-CM | POA: Diagnosis not present

## 2022-08-12 DIAGNOSIS — E43 Unspecified severe protein-calorie malnutrition: Secondary | ICD-10-CM | POA: Insufficient documentation

## 2022-08-12 HISTORY — PX: IR LUMBAR DISC ASPIRATION W/IMG GUIDE: IMG5306

## 2022-08-12 LAB — RENAL FUNCTION PANEL
Albumin: 1.9 g/dL — ABNORMAL LOW (ref 3.5–5.0)
Anion gap: 11 (ref 5–15)
BUN: 21 mg/dL — ABNORMAL HIGH (ref 6–20)
CO2: 25 mmol/L (ref 22–32)
Calcium: 8.8 mg/dL — ABNORMAL LOW (ref 8.9–10.3)
Chloride: 101 mmol/L (ref 98–111)
Creatinine, Ser: 0.74 mg/dL (ref 0.44–1.00)
GFR, Estimated: >60 mL/min
Glucose, Bld: 109 mg/dL — ABNORMAL HIGH (ref 70–99)
Phosphorus: 3.7 mg/dL (ref 2.5–4.6)
Potassium: 3.8 mmol/L (ref 3.5–5.1)
Sodium: 137 mmol/L (ref 135–145)

## 2022-08-12 LAB — CBC
HCT: 23.9 % — ABNORMAL LOW (ref 36.0–46.0)
Hemoglobin: 7.4 g/dL — ABNORMAL LOW (ref 12.0–15.0)
MCH: 22.6 pg — ABNORMAL LOW (ref 26.0–34.0)
MCHC: 31 g/dL (ref 30.0–36.0)
MCV: 72.9 fL — ABNORMAL LOW (ref 80.0–100.0)
Platelets: 346 10*3/uL (ref 150–400)
RBC: 3.28 MIL/uL — ABNORMAL LOW (ref 3.87–5.11)
RDW: 15.9 % — ABNORMAL HIGH (ref 11.5–15.5)
WBC: 7.4 10*3/uL (ref 4.0–10.5)
nRBC: 0 % (ref 0.0–0.2)

## 2022-08-12 LAB — CULTURE, BLOOD (ROUTINE X 2): Special Requests: ADEQUATE

## 2022-08-12 LAB — PROTIME-INR
INR: 1.1 (ref 0.8–1.2)
Prothrombin Time: 14.5 seconds (ref 11.4–15.2)

## 2022-08-12 LAB — SEDIMENTATION RATE: Sed Rate: 105 mm/hr — ABNORMAL HIGH (ref 0–22)

## 2022-08-12 LAB — MAGNESIUM: Magnesium: 1.8 mg/dL (ref 1.7–2.4)

## 2022-08-12 LAB — C-REACTIVE PROTEIN: CRP: 18.4 mg/dL — ABNORMAL HIGH (ref <1.0)

## 2022-08-12 MED ORDER — LABETALOL HCL 5 MG/ML IV SOLN
INTRAVENOUS | Status: AC | PRN
  Administered 2022-08-12: 20 mg via INTRAVENOUS

## 2022-08-12 MED ORDER — BUPIVACAINE HCL (PF) 0.5 % IJ SOLN
INTRAMUSCULAR | Status: AC
  Filled 2022-08-12: qty 30

## 2022-08-12 MED ORDER — FENTANYL CITRATE (PF) 100 MCG/2ML IJ SOLN
INTRAMUSCULAR | Status: AC | PRN
  Administered 2022-08-12: 50 ug via INTRAVENOUS
  Administered 2022-08-12: 25 ug via INTRAVENOUS

## 2022-08-12 MED ORDER — ENOXAPARIN SODIUM 40 MG/0.4ML IJ SOSY
40.0000 mg | PREFILLED_SYRINGE | INTRAMUSCULAR | Status: DC
  Administered 2022-08-13 – 2022-08-17 (×5): 40 mg via SUBCUTANEOUS
  Filled 2022-08-12 (×5): qty 0.4

## 2022-08-12 MED ORDER — FENTANYL CITRATE (PF) 100 MCG/2ML IJ SOLN
INTRAMUSCULAR | Status: AC
  Filled 2022-08-12: qty 2

## 2022-08-12 MED ORDER — MIDAZOLAM HCL 2 MG/2ML IJ SOLN
INTRAMUSCULAR | Status: AC
  Filled 2022-08-12: qty 2

## 2022-08-12 MED ORDER — METHOCARBAMOL 500 MG PO TABS
500.0000 mg | ORAL_TABLET | Freq: Three times a day (TID) | ORAL | Status: DC | PRN
  Administered 2022-08-12 – 2022-08-18 (×11): 500 mg via ORAL
  Filled 2022-08-12 (×13): qty 1

## 2022-08-12 MED ORDER — LABETALOL HCL 5 MG/ML IV SOLN
INTRAVENOUS | Status: AC
  Filled 2022-08-12: qty 4

## 2022-08-12 MED ORDER — HYDROMORPHONE HCL 1 MG/ML IJ SOLN
0.5000 mg | INTRAMUSCULAR | Status: DC | PRN
  Administered 2022-08-12 – 2022-08-14 (×5): 0.5 mg via INTRAVENOUS
  Filled 2022-08-12 (×5): qty 0.5

## 2022-08-12 MED ORDER — POLYETHYLENE GLYCOL 3350 17 G PO PACK
17.0000 g | PACK | Freq: Two times a day (BID) | ORAL | Status: DC | PRN
  Administered 2022-08-16 – 2022-08-17 (×2): 17 g via ORAL
  Filled 2022-08-12 (×2): qty 1

## 2022-08-12 MED ORDER — SENNOSIDES-DOCUSATE SODIUM 8.6-50 MG PO TABS
2.0000 | ORAL_TABLET | Freq: Two times a day (BID) | ORAL | Status: DC | PRN
  Administered 2022-08-14 – 2022-08-17 (×3): 2 via ORAL
  Filled 2022-08-12 (×3): qty 2

## 2022-08-12 MED ORDER — MIDAZOLAM HCL 2 MG/2ML IJ SOLN
INTRAMUSCULAR | Status: AC | PRN
  Administered 2022-08-12: 1 mg via INTRAVENOUS
  Administered 2022-08-12: .5 mg via INTRAVENOUS

## 2022-08-12 MED ORDER — LIDOCAINE HCL 1 % IJ SOLN
INTRAMUSCULAR | Status: AC
  Filled 2022-08-12: qty 20

## 2022-08-12 NOTE — Plan of Care (Signed)

## 2022-08-12 NOTE — Progress Notes (Addendum)
Initial Nutrition Assessment  DOCUMENTATION CODES:  Underweight, Severe malnutrition in context of chronic illness  INTERVENTION:  Continue regular diet Ensure Enlive po TID, each supplement provides 350 kcal and 20 grams of protein. Magic cup TID with meals, each supplement provides 290 kcal and 9 grams of protein MVI with minerals daily  NUTRITION DIAGNOSIS:  Severe Malnutrition (in the context of chronic illness) related to poor appetite as evidenced by severe fat depletion, severe muscle depletion, energy intake < or equal to 75% for > or equal to 1 month.  GOAL:  Patient will meet greater than or equal to 90% of their needs  MONITOR:  PO intake, Supplement acceptance, Labs, Weight trends  REASON FOR ASSESSMENT:  Consult Assessment of nutrition requirement/status  ASSESSMENT:  Pt with hx of HTN presented to ED with abdominal and back pain.  Pt resting in bed at the time of assessment, recently returned from IR for disc aspiration. Pt reports that poor intake and weight loss has been ongoing for about a month due to the pain in her back and legs.   Pt reports a usual weight of 145. Has been ~ 120 lbs since loss over the last month or so. Reports that on a good day at home, she is only eating 2 small meals. States appetite is better in the morning but by the end of the day is not feeling well and eats very little. Significant muscle and fat deficits on exam and pt meets criteria for severe malnutrition.  Pt has been drinking ensure and likes strawberry. Also agreeable to magic cup.   Average Meal Intake: 4/15: 75% intake x 2 recorded meals  Nutritionally Relevant Medications: Scheduled Meds:  docusate sodium  100 mg Oral BID   Ensure Enlive  237 mL Oral TID BM   Continuous Infusions:  cefTRIAXone (ROCEPHIN)  IV 2 g (08/11/22 1821)   PRN Meds: ondansetron, polyethylene glycol  Labs Reviewed: BUN 21  NUTRITION - FOCUSED PHYSICAL EXAM: Flowsheet Row Most Recent Value   Orbital Region Moderate depletion  Upper Arm Region Severe depletion  Thoracic and Lumbar Region Severe depletion  Buccal Region Moderate depletion  Temple Region Mild depletion  Clavicle Bone Region Mild depletion  Clavicle and Acromion Bone Region Severe depletion  Scapular Bone Region Moderate depletion  Dorsal Hand Severe depletion  Patellar Region Severe depletion  Anterior Thigh Region Severe depletion  Posterior Calf Region Severe depletion  Edema (RD Assessment) None  Hair Reviewed  Eyes Reviewed  Mouth Reviewed  Skin Reviewed  Nails Reviewed   Diet Order:   Diet Order             Diet regular Room service appropriate? Yes; Fluid consistency: Thin  Diet effective now                   EDUCATION NEEDS:  Education needs have been addressed  Skin:  Skin Assessment: Reviewed RN Assessment  Last BM:  4/13  Height:  Ht Readings from Last 1 Encounters:  08/10/22 6' (1.829 m)    Weight:  Wt Readings from Last 1 Encounters:  08/10/22 54.7 kg    Ideal Body Weight:  72.7 kg  BMI:  Body mass index is 16.36 kg/m.  Estimated Nutritional Needs:  Kcal:  1800-2000 kcal/d Protein:  90-100 g/d Fluid:  >/=1.8L/d   Greig Castilla, RD, LDN Clinical Dietitian RD pager # available in [facility]  After hours/weekend pager # available in [facility]

## 2022-08-12 NOTE — Progress Notes (Signed)
PROGRESS NOTE  Denise Hudson ZOX:[id number] DOB: [birth year]   PCP: Pcp, No  Patient is from: Home.  Lives at home.  Independently ambulates at baseline.  DOA: 08/09/2022 LOS: 2  Chief complaints Chief Complaint  Patient presents with   Abdominal Pain   Back Pain     Brief Narrative / Interim history: 62 year old F with PMH of HTN, nephrolithiasis with recent hospitalization for pyelonephritis and obstructive uropathy s/p stent placement followed by removal 2 weeks ago presenting with worsening low back pain with associated fever, nausea and vomiting for 2 to 3 days.  Reportedly had lingering back pain since she was hospitalized for obstructive apathy that did not resolve completely despite stent removal and finishing antibiotic course.  Workup concerning for discitis/osteomyelitis of L5-S1 and T11-12.  Blood culture with E. coli.  Neurosurgery consulted and did not feel there is need for surgery and recommended IV antibiotics.   Blood culture with E. Coli.  ID consulted recommended IR biopsy/aspiration.  Remains on IV ceftriaxone.     Subjective: Seen and examined earlier this morning.  No major events overnight of this morning.  Complains significant pain wrapping around her abdomen and back.  She is somewhat tearful.  She says she has not felt well since she was diagnosed with kidney stone and had a stent.  She says she has not eaten well or do much physically.  She is frustrated.  She denies depressed mood.  She denies nausea or vomiting.  Denies UTI symptoms.  Objective: Vitals:   08/12/22 1125 08/12/22 1130 08/12/22 1139 08/12/22 1156  BP: (!) 156/80 (!) 179/82 (!) 163/89 (!) 153/83  Pulse: 74 88 81 77  Resp: Temp:    99.1 F (37.3 C)  TempSrc:    Oral  SpO2: 100% 96% 98% 100%  Weight:      Height:        Examination:  GENERAL: No apparent distress.  Nontoxic. HEENT: MMM.  Vision and hearing grossly intact.  NECK: Supple.  No apparent JVD.  RESP:  No  IWOB.  Fair aeration bilaterally. CVS: RRR.  Regular rhythm.  Heart sounds normal.  ABD/GI/GU: BS+. Abd soft.  Some diffuse tenderness. MSK/EXT:  Moves extremities.  Significant muscle mass and subcu fat loss. SKIN: no apparent skin lesion or wound NEURO: Awake, alert and oriented appropriately.  No apparent focal neuro deficit. PSYCH: Somewhat tearful.  Denies depressed mood.  Procedures:  None  Microbiology summarized: Blood culture with pansensitive E. Coli Urine culture NGTD  Assessment and plan: Principal Problem:   Discitis of lumbar region Active Problems:   Ureterolithiasis   Pyelonephritis of right kidney   Microcytic anemia   Hypokalemia   Discitis of lumbosacral region   Benign essential HTN   E coli bacteremia   Discitis of thoracic region   Underweight  E. coli bacteremia-blood culture with E. coli.  Urinary source from recent obstructive UTI?  Urine culture NGTD Discitis/osteomyelitis at L5-S1 and possible discitis at T11-12: I am not sure if this is related to the above.  -CRP and ESR elevated. -ID consulted and recommended repeat blood culture, needle aspiration and continuing IV ceftriaxone. -Neurosurgery consulted in ED and did not feel there is need for surgery, and recommended antibiotics. -Follow repeat blood culture and further ID recommendation -Added IV Dilaudid for pain control -Bowel regimen ordered as soon  Nonobstructive bilateral nephrolithiasis: Patient with recent pyelo-, nephrolithiasis and obstructive uropathy with E. coli for which she completed treatment.  Urine culture NGTD -Outpatient follow-up with urology  Microcytic anemia: Hgb slowly drifting down.  No obvious bleeding.  Recent Labs    07/12/22 1710 07/13/22 0447 07/16/22 0441 07/17/22 0456 07/18/22 0500 07/19/22 0438 08/09/22 2154 08/10/22 1459 08/11/22 0317 08/12/22 0558  HGB 11.6* 10.9* 9.0* 9.4* 8.7* 9.8* 8.5* 7.4* 7.5* 7.4*  -Continue monitoring.  Essential  hypertension: BP improved. -Continue home amlodipine -P.o. hydralazine as needed  Sinus bradycardia: Not on nodal blocking agent. -Check EKG and TSH -Avoid nodal blocking agents -Optimize ultralights  Hyponatremia/hypokalemia: Resolved.  Underweight/unintentional weight loss Wt Readings from Last 10 Encounters:  08/10/22 54.7 kg  07/23/22 60.7 kg  07/17/22 68.7 kg  03/04/17 65.1 kg  09/23/15 65.8 kg  Body mass index is 16.36 kg/m. -Consult dietitian          DVT prophylaxis:  enoxaparin (LOVENOX) injection 40 mg Start: 08/13/22 2200 SCDs Start: 08/10/22 1151  Code Status: Full code Family Communication: None at bedside Level of care: Med-Surg Status is: Inpatient Remains inpatient appropriate because: E. coli bacteremia, lumbosacral and thoracic discitis/possible osteomyelitis   Final disposition: TBD Consultants:  Neurosurgery Infectious disease Interventional radiology  35 minutes with more than 50% spent in reviewing records, counseling patient/family and coordinating care.   Sch Meds:  Scheduled Meds:  amLODipine  5 mg Oral Daily   docusate sodium  100 mg Oral BID   [START ON 08/13/2022] enoxaparin (LOVENOX) injection  40 mg Subcutaneous Q24H   feeding supplement  237 mL Oral TID BM   Continuous Infusions:  cefTRIAXone (ROCEPHIN)  IV 2 g (08/11/22 1821)   PRN Meds:.albuterol, hydrALAZINE, HYDROmorphone (DILAUDID) injection, ibuprofen, metoprolol tartrate, ondansetron **OR** ondansetron (ZOFRAN) IV, oxyCODONE-acetaminophen, polyethylene glycol, senna-docusate, traZODone  Antimicrobials: Anti-infectives (From admission, onward)    Start     Dose/Rate Route Frequency Ordered Stop   08/11/22 0600  vancomycin (VANCOCIN) IVPB 1000 mg/200 mL premix  Status:  Discontinued        1,000 mg 200 mL/hr over 60 Minutes Intravenous Every 24 hours 08/10/22 1516 08/10/22 1808   08/11/22 0500  vancomycin (VANCOREADY) IVPB 1250 mg/250 mL  Status:  Discontinued         1,250 mg 166.7 mL/hr over 90 Minutes Intravenous Every 24 hours 08/10/22 1317 08/10/22 1516   08/10/22 2100  ceFEPIme (MAXIPIME) 2 g in sodium chloride 0.9 % 100 mL IVPB  Status:  Discontinued        2 g 200 mL/hr over 30 Minutes Intravenous Every 12 hours 08/10/22 1514 08/10/22 1808   08/10/22 1900  cefTRIAXone (ROCEPHIN) 2 g in sodium chloride 0.9 % 100 mL IVPB        2 g 200 mL/hr over 30 Minutes Intravenous Every 24 hours 08/10/22 1808     08/10/22 1800  ceFEPIme (MAXIPIME) 2 g in sodium chloride 0.9 % 100 mL IVPB  Status:  Discontinued        2 g 200 mL/hr over 30 Minutes Intravenous Every 8 hours 08/10/22 1301 08/10/22 1514   08/10/22 1200  ceFEPIme (MAXIPIME) 1 g in sodium chloride 0.9 % 100 mL IVPB  Status:  Discontinued        1 g 200 mL/hr over 30 Minutes Intravenous Every 8 hours 08/10/22 1155 08/10/22 1300   08/10/22 0915  vancomycin (VANCOCIN) IVPB 1000 mg/200 mL premix        1,000 mg 200 mL/hr over 60 Minutes Intravenous  Once 08/10/22 0905 08/10/22 1050   08/10/22 0900  ceFEPIme (MAXIPIME) 2 g in sodium  chloride 0.9 % 100 mL IVPB        2 g 200 mL/hr over 30 Minutes Intravenous  Once 08/10/22 0851 08/10/22 0940   08/10/22 0900  metroNIDAZOLE (FLAGYL) IVPB 500 mg  Status:  Discontinued        500 mg 100 mL/hr over 60 Minutes Intravenous Every 12 hours 08/10/22 0851 08/10/22 1808        I have personally reviewed the following labs and images: CBC: Recent Labs  Lab 08/09/22 2154 08/10/22 1459 08/11/22 0317 08/12/22 0558  WBC 9.8 11.8* 8.5 7.4  NEUTROABS 7.4  --   --   --   HGB 8.5* 7.4* 7.5* 7.4*  HCT 28.4* 24.9* 24.8* 23.9*  MCV 74.7* 74.6* 73.4* 72.9*  PLT 384 346 304 346   BMP &GFR Recent Labs  Lab 08/09/22 2154 08/10/22 1459 08/11/22 0317 08/12/22 0558  NA 133*  --  137 137  K 3.0*  --  3.5 3.8  CL 101  --  104 101  CO2 23  --  25 25  GLUCOSE 126*  --  107* 109*  BUN 17  --  18 21*  CREATININE 0.94 0.90 0.84 0.74  CALCIUM 8.8*  --  9.0 8.8*   MG  --   --   --  1.8  PHOS  --   --   --  3.7   Estimated Creatinine Clearance: 64.6 mL/min (by C-G formula based on SCr of 0.74 mg/dL). Liver & Pancreas: Recent Labs  Lab 08/09/22 2154 08/12/22 0558  AST 17  --   ALT 18  --   ALKPHOS 161*  --   BILITOT 0.6  --   PROT 8.2*  --   ALBUMIN 2.7* 1.9*   Recent Labs  Lab 08/09/22 2154  LIPASE 25   No results for input(s): "AMMONIA" in the last 168 hours. Diabetic: No results for input(s): "HGBA1C" in the last 72 hours. No results for input(s): "GLUCAP" in the last 168 hours. Cardiac Enzymes: No results for input(s): "CKTOTAL", "CKMB", "CKMBINDEX", "TROPONINI" in the last 168 hours. No results for input(s): "PROBNP" in the last 8760 hours. Coagulation Profile: Recent Labs  Lab 08/12/22 0558  INR 1.1   Thyroid Function Tests: Recent Labs    08/11/22 1136  TSH 1.831   Lipid Profile: No results for input(s): "CHOL", "HDL", "LDLCALC", "TRIG", "CHOLHDL", "LDLDIRECT" in the last 72 hours. Anemia Panel: Recent Labs    08/11/22 1136  VITAMINB12 273  FOLATE 7.2  FERRITIN 335*  TIBC 150*  IRON 7*  RETICCTPCT 1.6   Urine analysis:    Component Value Date/Time   COLORURINE YELLOW 08/09/2022 2159   APPEARANCEUR HAZY (A) 08/09/2022 2159   LABSPEC 1.018 08/09/2022 2159   PHURINE 5.0 08/09/2022 2159   GLUCOSEU NEGATIVE 08/09/2022 2159   HGBUR SMALL (A) 08/09/2022 2159   BILIRUBINUR NEGATIVE 08/09/2022 2159   KETONESUR NEGATIVE 08/09/2022 2159   PROTEINUR 30 (A) 08/09/2022 2159   UROBILINOGEN 1.0 01/02/2015 0043   NITRITE NEGATIVE 08/09/2022 2159   LEUKOCYTESUR LARGE (A) 08/09/2022 2159   Sepsis Labs: Invalid input(s): "PROCALCITONIN", "LACTICIDVEN"  Microbiology: Recent Results (from the past 240 hour(s))  Urine Culture     Status: None   Collection Time: 08/09/22 11:57 PM   Specimen: Urine, Clean Catch  Result Value Ref Range Status   Specimen Description   Final    URINE, CLEAN CATCH Performed at [facility], [address], [place], [place] [address]  Special Requests   Final    NONE Performed at [facility], [address], [place], [place] [address]    Culture   Final    NO GROWTH Performed at [facility], [address], [place], [place] [address]    Report Status 08/11/2022 FINAL  Final  Culture, blood (routine x 2)     Status: None (Preliminary result)   Collection Time: 08/10/22 12:34 AM   Specimen: BLOOD LEFT FOREARM  Result Value Ref Range Status   Specimen Description   Final    BLOOD LEFT FOREARM Performed at [facility], [address] [place], [place] [address]    Special Requests   Final    BOTTLES DRAWN AEROBIC AND ANAEROBIC Blood Culture adequate volume Performed at [facility], [address], [place], [place] [address]    Culture  Setup Time   Final    GRAM NEGATIVE RODS ANAEROBIC BOTTLE ONLY CRITICAL VALUE NOTED.  VALUE IS CONSISTENT WITH PREVIOUSLY REPORTED AND CALLED VALUE.    Culture   Final    GRAM NEGATIVE RODS IDENTIFICATION TO FOLLOW Performed at [facility], [address], [place], [place] [address]    Report Status PENDING  Incomplete  Culture, blood (routine x 2)     Status: Abnormal   Collection Time: 08/10/22  1:31 AM   Specimen: BLOOD  Result Value Ref Range Status   Specimen Description BLOOD RIGHT ANTECUBITAL  Final   Special Requests   Final    BOTTLES DRAWN AEROBIC AND ANAEROBIC Blood Culture adequate volume   Culture  Setup Time   Final    GRAM NEGATIVE RODS IN BOTH AEROBIC AND ANAEROBIC BOTTLES CRITICAL RESULT CALLED TO, READ BACK BY AND VERIFIED WITH: PHARMD Malva Cogan [id number] AT 1738 BY EC Performed at [facility], [address] [place], [place] [address]    Culture ESCHERICHIA COLI (A)  Final   Report Status 08/12/2022 FINAL  Final   Organism ID, Bacteria ESCHERICHIA COLI  Final      Susceptibility   Escherichia coli -  MIC*    AMPICILLIN <=2 SENSITIVE Sensitive     CEFEPIME <=0.12 SENSITIVE Sensitive     CEFTAZIDIME <=1 SENSITIVE Sensitive     CEFTRIAXONE <=0.25 SENSITIVE Sensitive     CIPROFLOXACIN <=0.25 SENSITIVE Sensitive     GENTAMICIN <=1 SENSITIVE Sensitive     IMIPENEM <=0.25 SENSITIVE Sensitive     TRIMETH/SULFA <=20 SENSITIVE Sensitive     AMPICILLIN/SULBACTAM <=2 SENSITIVE Sensitive     PIP/TAZO <=4 SENSITIVE Sensitive     * ESCHERICHIA COLI  Blood Culture ID Panel (Reflexed)     Status: Abnormal   Collection Time: 08/10/22  1:31 AM  Result Value Ref Range Status   Enterococcus faecalis NOT DETECTED NOT DETECTED Final   Enterococcus Faecium NOT DETECTED NOT DETECTED Final   Listeria monocytogenes NOT DETECTED NOT DETECTED Final   Staphylococcus species NOT DETECTED NOT DETECTED Final   Staphylococcus aureus (BCID) NOT DETECTED NOT DETECTED Final   Staphylococcus epidermidis NOT DETECTED NOT DETECTED Final   Staphylococcus lugdunensis NOT DETECTED NOT DETECTED Final   Streptococcus species NOT DETECTED NOT DETECTED Final   Streptococcus agalactiae NOT DETECTED NOT DETECTED Final   Streptococcus pneumoniae NOT DETECTED NOT DETECTED Final   Streptococcus pyogenes NOT DETECTED NOT DETECTED Final   A.calcoaceticus-baumannii NOT DETECTED NOT DETECTED Final   Bacteroides fragilis NOT DETECTED NOT DETECTED Final   Enterobacterales DETECTED (A)  NOT DETECTED Final    Comment: Enterobacterales represent a large order of gram negative bacteria, not a single organism. CRITICAL RESULT CALLED TO, READ BACK BY AND VERIFIED WITH: PHARMD Malva Cogan [id number] AT 1738 BY EC    Enterobacter cloacae complex NOT DETECTED NOT DETECTED Final   Escherichia coli DETECTED (A) NOT DETECTED Final    Comment: CRITICAL RESULT CALLED TO, READ BACK BY AND VERIFIED WITH: PHARMD Malva Cogan [id number] AT 1738 BY EC    Klebsiella aerogenes NOT DETECTED NOT DETECTED Final   Klebsiella oxytoca NOT DETECTED NOT DETECTED Final    Klebsiella pneumoniae NOT DETECTED NOT DETECTED Final   Proteus species NOT DETECTED NOT DETECTED Final   Salmonella species NOT DETECTED NOT DETECTED Final   Serratia marcescens NOT DETECTED NOT DETECTED Final   Haemophilus influenzae NOT DETECTED NOT DETECTED Final   Neisseria meningitidis NOT DETECTED NOT DETECTED Final   Pseudomonas aeruginosa NOT DETECTED NOT DETECTED Final   Stenotrophomonas maltophilia NOT DETECTED NOT DETECTED Final   Candida albicans NOT DETECTED NOT DETECTED Final   Candida auris NOT DETECTED NOT DETECTED Final   Candida glabrata NOT DETECTED NOT DETECTED Final   Candida krusei NOT DETECTED NOT DETECTED Final   Candida parapsilosis NOT DETECTED NOT DETECTED Final   Candida tropicalis NOT DETECTED NOT DETECTED Final   Cryptococcus neoformans/gattii NOT DETECTED NOT DETECTED Final   CTX-M ESBL NOT DETECTED NOT DETECTED Final   Carbapenem resistance IMP NOT DETECTED NOT DETECTED Final   Carbapenem resistance KPC NOT DETECTED NOT DETECTED Final   Carbapenem resistance NDM NOT DETECTED NOT DETECTED Final   Carbapenem resist OXA 48 LIKE NOT DETECTED NOT DETECTED Final   Carbapenem resistance VIM NOT DETECTED NOT DETECTED Final    Comment: Performed at [facility], [address], [place], [place] [address]    Radiology Studies: No results found.    Sameul Tagle T. Charnele Semple [facility]  If 7PM-7AM, please contact night-coverage [email or web address] 08/12/2022, 12:40 PM

## 2022-08-12 NOTE — Consult Note (Addendum)
Chief Complaint: Patient was seen in consultation today for back pain; discitis-- L5-S1 disc aspiration vs bone biopsy Chief Complaint  Patient presents with   Abdominal Pain   Back Pain   at the request of Dr Leafy Half  Supervising Physician: Baldemar Lenis  Patient Status: [facility] - In-pt  History of Present Illness: Denise Hudson is a 62 y.o. female   Post nephrolithiasis and pyelonephritis--obstructive uropathy and stent placement --- with stent removal 2 weeks ago Has had worsening back pain since then +fever/ nausea/vomiting Ecoli bacteremia  MRI 4/14:  IMPRESSION: 1. 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 No spinal canal abscess. 2. Partially covered inflammation at the T12 anterior superior corner, likely additional site of discitis at T11-12 based on preceding CT.  Request made for L5-S1 disc aspiration vs bone biopsy Imaging reviewed with Dr Tommi Rumps Melchor Amour--- she approves procedure   Past Medical History:  Diagnosis Date   Constipation    History of acute pyelonephritis 07/12/2022   secondary to uropathy obstruction due to kidney stones   History of kidney stones    Microcytic anemia 07/12/2022   Nephrolithiasis    left side calculi non-obstructive per CT 07-18-2022   Ureteral calculi 07/12/2022   right side   Wears contact lenses     Past Surgical History:  Procedure Laterality Date   APPENDECTOMY  age 64   CYSTOSCOPY WITH STENT PLACEMENT Right 02/10/2017   Procedure: CYSTOSCOPY WITH RIGHT STENT PLACEMENT;  Surgeon: Rene Paci, MD;  Location: [facility];  Service: Urology;  Laterality: Right;   CYSTOSCOPY/URETEROSCOPY/HOLMIUM LASER/STENT PLACEMENT Right 03/04/2017   Procedure: CYSTOSCOPY/URETEROSCOPY/HOLMIUM LASER/STENT PLACEMENT;  Surgeon: Rene Paci, MD;  Location: [facility];  Service: Urology;  Laterality: Right;  NEEDS 30 MIN FOR  PROCEDURE   CYSTOSCOPY/URETEROSCOPY/HOLMIUM LASER/STENT PLACEMENT Right 07/13/2022   Procedure: CYSTOSCOPY RIGHT URETERAL STENT PLACEMENT;  Surgeon: Crista Elliot, MD;  Location: [facility];  Service: Urology;  Laterality: Right;   CYSTOSCOPY/URETEROSCOPY/HOLMIUM LASER/STENT PLACEMENT Right 07/23/2022   Procedure: CYSTOSCOPY RIGHT URETEROSCOPY, HOLMIUM LASER LITHOTRIPSY, AND RIGHT URETERAL STENT PLACEMENT;  Surgeon: Rene Paci, MD;  Location: [facility];  Service: Urology;  Laterality: Right;  90 MINUTES   EXTRACORPOREAL SHOCK WAVE LITHOTRIPSY  2008    Allergies: Patient has no known allergies.  Medications: Prior to Admission medications   Medication Sig Start Date End Date Taking? Authorizing Provider  amLODipine (NORVASC) 5 MG tablet Take 1 tablet (5 mg total) by mouth daily. Patient not taking: Reported on 08/10/2022 07/20/22   Alberteen Sam, MD  ondansetron (ZOFRAN) 4 MG tablet Take 1 tablet (4 mg total) by mouth every 6 (six) hours as needed for nausea. Patient not taking: Reported on 08/10/2022 07/19/22   Alberteen Sam, MD  oxybutynin (DITROPAN) 5 MG tablet Take 1 tablet (5 mg total) by mouth 3 (three) times daily. Patient not taking: Reported on 08/10/2022 07/19/22   Alberteen Sam, MD  oxyCODONE-acetaminophen (PERCOCET) 7.5-325 MG tablet Take 1 tablet by mouth every 4 (four) hours as needed for moderate pain. Patient not taking: Reported on 08/10/2022 07/19/22   Alberteen Sam, MD     History reviewed. No pertinent family history.  Social History   Socioeconomic History   Marital status: Divorced    Spouse name: Not on file   Number of children: Not on file   Years of education: Not on file   Highest education level: Not  on file  Occupational History   Not on file  Tobacco Use   Smoking status: Former    Years: 20    Types: Cigarettes    Quit date: 02/25/2014    Years since quitting: 8.4   Smokeless tobacco:  Never  Vaping Use   Vaping Use: Never used  Substance and Sexual Activity   Alcohol use: No   Drug use: No   Sexual activity: Not on file  Other Topics Concern   Not on file  Social History Narrative   Not on file   Social Determinants of Health   Financial Resource Strain: Not on file  Food Insecurity: No Food Insecurity (08/10/2022)   Hunger Vital Sign    Worried About Running Out of Food in the Last Year: Never true    Ran Out of Food in the Last Year: Never true  Transportation Needs: No Transportation Needs (08/10/2022)   PRAPARE - Administrator, Civil Service (Medical): No    Lack of Transportation (Non-Medical): No  Physical Activity: Not on file  Stress: Not on file  Social Connections: Not on file    Review of Systems: A 12 point ROS discussed and pertinent positives are indicated in the HPI above.  All other systems are negative.  Review of Systems  Constitutional:  Positive for activity change and fever.  Respiratory:  Negative for cough and shortness of breath.   Cardiovascular:  Negative for chest pain.  Gastrointestinal:  Positive for nausea. Negative for abdominal pain.  Musculoskeletal:  Positive for back pain and gait problem.  Psychiatric/Behavioral:  Negative for behavioral problems and confusion.     Vital Signs: BP (!) 142/66 (BP Location: Left Arm)   Pulse 64   Temp 99 F (37.2 C) (Oral)   Resp 16   Ht 6' (1.829 m)   Wt 120 lb 9.6 oz (54.7 kg)   LMP 09/23/2015   SpO2 99%   BMI 16.36 kg/m     Physical Exam Vitals reviewed.  HENT:     Mouth/Throat:     Mouth: Mucous membranes are moist.  Cardiovascular:     Rate and Rhythm: Normal rate and regular rhythm.  Pulmonary:     Breath sounds: Normal breath sounds.  Abdominal:     Palpations: Abdomen is soft.     Tenderness: There is no abdominal tenderness.  Skin:    General: Skin is warm.  Neurological:     Mental Status: She is alert and oriented to person, place, and time.   Psychiatric:        Behavior: Behavior normal.     Imaging: MR Lumbar Spine W Wo Contrast  Result Date: 08/10/2022 CLINICAL DATA:  Lower back pain with infection suspected. Positive x-ray EXAM: MRI LUMBAR SPINE WITHOUT AND WITH CONTRAST TECHNIQUE: Multiplanar and multiecho pulse sequences of the lumbar spine were obtained without and with intravenous contrast. CONTRAST:  6mL GADAVIST GADOBUTROL 1 MMOL/ML IV SOLN COMPARISON:  Abdominal CT from earlier today FINDINGS: Segmentation:  5 lumbar type vertebrae Alignment: Straightening of lumbar lordosis with slight L3-4 anterolisthesis Vertebrae: Marrow edema and endplate destruction at L5-S1 with paravertebral inflammation. The disc space is nonenhancing, likely purulence. Ventral epidural enhancement without collection. Partially covered corner edema at T12 anteriorly and superiorly. Conus medullaris and cauda equina: Conus extends to the L1 level. Conus and cauda equina appear normal. Paraspinal and other soft tissues: As above Disc levels: Generalized disc desiccation and narrowing with bulging. Limited facet degeneration. Disc bulging  and height loss at L5-S1 causes moderate biforaminal stenosis. IMPRESSION: 1. 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 No spinal canal abscess. 2. Partially covered inflammation at the T12 anterior superior corner, likely additional site of discitis at T11-12 based on preceding CT. Electronically Signed   By: Tiburcio Pea M.D.   On: 08/10/2022 08:42   CT ABDOMEN PELVIS W CONTRAST  Result Date: 08/10/2022 CLINICAL DATA:  Sepsis, right flank pain, left lower quadrant pain, and fever. Recent pyelonephritis with kidney stone. EXAM: CT ABDOMEN AND PELVIS WITH CONTRAST TECHNIQUE: Multidetector CT imaging of the abdomen and pelvis was performed using the standard protocol following bolus administration of intravenous contrast. RADIATION DOSE REDUCTION: This exam was performed  according to the departmental dose-optimization program which includes automated exposure control, adjustment of the mA and/or kV according to patient size and/or use of iterative reconstruction technique. CONTRAST:  OMNIPAQUE IOHEXOL 300 MG/ML  SOLN COMPARISON:  07/18/2022. FINDINGS: Lower chest: The heart is enlarged. There is atelectasis at the lung bases. A 5 mm pleural-based nodule is present in the left lower lobe, axial image 1. Hepatobiliary: No focal liver abnormality is seen. No gallstones, gallbladder wall thickening, or biliary dilatation. Pancreas: Unremarkable. No pancreatic ductal dilatation or surrounding inflammatory changes. Spleen: Spleen is normal in size. Scattered subcentimeter hypodensities are present in the spleen, possible cysts or hemangiomas. Adrenals/Urinary Tract: The adrenal glands are within normal limits. The kidneys enhance symmetrically. Nonobstructive renal calculi are noted bilaterally. There is a cyst in the upper pole of the right kidney. No hydroureteronephrosis bilaterally. The bladder is within normal limits. Stomach/Bowel: Stomach is within normal limits. Appendix is not seen. No evidence of bowel wall thickening, distention, or inflammatory changes. No free air or pneumatosis. Vascular/Lymphatic: Aortic atherosclerosis. A nonspecific enlarged lymph node is present along the iliac chain on the right measuring 1.1 cm. Reproductive: Uterus and bilateral adnexa are unremarkable. Other: No abdominopelvic ascites. Musculoskeletal: Degenerative changes are present in the thoracolumbar spine. There are bony erosions along the endplates at L5-S1 with increased soft tissue density in the region and likely disc herniation. No acute fracture. IMPRESSION: 1. Bilateral nephrolithiasis with no obstructive uropathy bilaterally. 2. New erosions at the inferior endplate at L5 and superior endplate as S1 with with surrounding soft tissue thickening, concerning for  osteomyelitis/discitis. MRI with contrast is recommended for further evaluation. 3. 5 mm pleural-based nodule in the left lower lobe. No follow-up needed if patient is low-risk.This recommendation follows the consensus statement: Guidelines for Management of Incidental Pulmonary Nodules Detected on CT Images: From the Fleischner Society 2017; Radiology 2017; 284:228-243. Electronically Signed   By: Thornell Sartorius M.D.   On: 08/10/2022 01:38   CT ABDOMEN PELVIS W CONTRAST  Result Date: 07/18/2022 CLINICAL DATA:  History of right flank pain and hematuria, urinary calculi, stent placement EXAM: CT ABDOMEN AND PELVIS WITH CONTRAST TECHNIQUE: Multidetector CT imaging of the abdomen and pelvis was performed using the standard protocol following bolus administration of intravenous contrast. RADIATION DOSE REDUCTION: This exam was performed according to the departmental dose-optimization program which includes automated exposure control, adjustment of the mA and/or kV according to patient size and/or use of iterative reconstruction technique. CONTRAST:  OMNIPAQUE IOHEXOL 300 MG/ML  SOLN COMPARISON:  07/12/2022, 07/16/2022 FINDINGS: Lower chest: Trace bilateral pleural effusions and dependent lower lobe atelectasis. Hepatobiliary: No focal liver abnormality is seen. No gallstones, gallbladder wall thickening, or biliary dilatation. Pancreas: Unremarkable. No pancreatic ductal dilatation or surrounding inflammatory changes. Spleen:  Normal in size without focal abnormality. Adrenals/Urinary Tract: Since the prior exam, a right ureteral stent has been placed, proximal aspect coiled at the right UPJ and distal aspect coiled within the bladder lumen. There is a persistent 7 mm calculus within the distal right ureter reference image 76/2. Two of the other distal right ureteral calculi seen on the previous exam have been displaced proximally, with an 8 mm calculus seen in the proximal right ureter reference image 46/2 and  a 6 mm curvilinear calculus within the right renal pelvis reference image 37/2. There is persistent mild right-sided hydronephrosis. Striated right-sided nephrogram and mucosal enhancement of the right renal pelvis consistent with superimposed infection and pyelonephritis. No evidence of renal abscess. Stable benign right renal cyst does not require follow-up. Stable appearance of the left kidney, with punctate nonobstructing left renal calculi identified. No obstruction. The adrenals and bladder are unremarkable. Stomach/Bowel: No bowel obstruction or ileus. No bowel wall thickening or inflammatory change. Vascular/Lymphatic: Stable aortic atherosclerosis. Subcentimeter retroperitoneal lymph nodes are stable. No pathologic adenopathy. Reproductive: Uterus and bilateral adnexa are unremarkable. Other: Trace free fluid within the pelvis. No free intraperitoneal gas. Small fat containing right periumbilical hernia. Musculoskeletal: No acute or destructive bony lesions. Reconstructed images demonstrate no additional findings. IMPRESSION: 1. Interval placement of a right ureteral stent as above. 2 of the previously identified obstructing distal right ureteral calculi have migrated proximally, one within the right renal pelvis and a second within the proximal right ureter adjacent to the indwelling stent. The third calculus remains lodged within the distal right ureter just proximal to the UVJ. 2. Persistent mild right hydronephrosis, with abnormal striated nephrogram of the right kidney and right renal pelvis mucosal enhancement consistent with superimposed infection and pyelonephritis. No evidence of renal abscess. 3. Trace pelvic free fluid. 4. Stable punctate nonobstructing left renal calculi. 5. Trace bilateral pleural effusions with minimal dependent lower lobe atelectasis. 6.  Aortic Atherosclerosis (ICD10-I70.0). Electronically Signed   By: Sharlet Salina M.D.   On: 07/18/2022 10:49   US RENAL  Result Date:  07/16/2022 CLINICAL DATA:  Hydronephrosis EXAM: RENAL / URINARY TRACT ULTRASOUND COMPLETE COMPARISON:  CT 07/12/2022 FINDINGS: Right Kidney: Renal measurements: 13.4 x 5.7 x 6.3 = volume: 250.3 mL. Heterogeneous echotexture. Mild perinephric fluid. Ectatic appearance of the renal pelvis and proximal ureter. Similar to the prior CT scan. Note is also made of an anechoic structure of the right kidney measuring 3.2 x 2.6 x 3.0 cm with smooth margins and through transmission consistent with a Bosniak 1 cyst. Left Kidney: Renal measurements: 12.8 x 6.5 x 7.2 = volume: 312.7 mL. No collecting system dilatation. Mild perinephric fluid Bladder: Bladder is mildly distended with fluid.  Stent in place. Other: None. IMPRESSION: Slight ectasia of the right renal pelvis. Distended bladder.  Stent in the bladder. Slightly heterogeneous right renal echotexture. Electronically Signed   By: Karen Kays M.D.   On: 07/16/2022 17:46   DG C-Arm 1-60 Min-No Report  Result Date: 07/13/2022 Fluoroscopy was utilized by the requesting physician.  No radiographic interpretation.    Labs:  CBC: Recent Labs    08/09/22 2154 08/10/22 1459 08/11/22 0317 08/12/22 0558  WBC 9.8 11.8* 8.5 7.4  HGB 8.5* 7.4* 7.5* 7.4*  HCT 28.4* 24.9* 24.8* 23.9*  PLT 384 346 304 346    COAGS: Recent Labs    08/12/22 0558  INR 1.1    BMP: Recent Labs    07/16/22 0441 07/17/22 0456 08/09/22 2154 08/10/22 1459 08/11/22 1610  NA 136 138 133*  --  137  K 3.6 3.5 3.0*  --  3.5  CL 104 106 101  --  104  CO2 --  25  GLUCOSE 102* 102* 126*  --  107*  BUN --  18  CALCIUM 8.2* 8.4* 8.8*  --  9.0  CREATININE 0.82 0.71 0.94 0.90 0.84  GFRNONAA >60 >60 >60 >60 >60    LIVER FUNCTION TESTS: Recent Labs    07/12/22 1710 07/13/22 0447 07/17/22 0456 08/09/22 2154  BILITOT 1.9* 1.2 1.0 0.6  AST 78* 57* 27 17  ALT 93* 73* 40 18  ALKPHOS 182* 154* 135* 161*  PROT 8.2* 7.0 6.2* 8.2*  ALBUMIN 3.1* 2.7* 2.1*  2.7*    TUMOR MARKERS: No results for input(s): "AFPTM", "CEA", "CA199", "CHROMGRNA" in the last 8760 hours.  Assessment and Plan:  Back pain - worsening x 2 weeks MRI revealing L5-S1 discitis Scheduled now for aspiration vs bone biopsy Risks and benefits of Lumbar 5-Sacral 1 disc aspiration vs bone biopsy was discussed with the patient and/or patient's family including, but not limited to bleeding, infection, damage to adjacent structures or low yield requiring additional tests.  All of the questions were answered and there is agreement to proceed. Consent signed and in chart.  Thank you for this interesting consult.  I greatly enjoyed meeting ODDIE KUHLMANN and look forward to participating in their care.  A copy of this report was sent to the requesting provider on this date.  Electronically Signed: Robet Leu, PA-C 08/12/2022, 7:01 AM   I spent a total of 20 minutes    in face to face in clinical consultation, greater than 50% of which was counseling/coordinating care for disc aspiration vs bone biopsy

## 2022-08-12 NOTE — Procedures (Signed)
INTERVENTIONAL NEURORADIOLOGY BRIEF POSTPROCEDURE NOTE  FLUOROSCOPY GUIDED L5-S1 DISC ASPIRATION  Attending: Dr. Baldemar Lenis  Diagnosis: Discitis/osteomyelitis  Access site: Percutaneous  Anesthesia: Moderate seadation  Medication used: 1.5 mg Versed IV; 75 mcg Fentanyl IV.  Complications: None.  Estimated blood loss: None.  Specimen: 2 FNA samples  Findings: L5-S1 endplate erosion. FNA performed with a 20 gauge spinal needle. Two samples obtained, first clear and second blood tinged.  The patient tolerated the procedure well without incident or complication and is in stable condition.

## 2022-08-12 NOTE — Hospital Course (Signed)
Mrs. Alridge is a 62 y.o. F with HTN, kidney stone recent hospitalization for pyelonephritis and obstructive uropathy s/p stent placement followed by removal 2 weeks ago presenting with worsening low back pain found to have osteomyelitis/discitis and 5 mm pleural-based nodule in LLL.      4/14: Admitted on antibiotics 4/15: ID consulted 4/16: IR aspirated disc space

## 2022-08-13 DIAGNOSIS — M4647 Discitis, unspecified, lumbosacral region: Secondary | ICD-10-CM | POA: Diagnosis not present

## 2022-08-13 DIAGNOSIS — E871 Hypo-osmolality and hyponatremia: Secondary | ICD-10-CM | POA: Insufficient documentation

## 2022-08-13 DIAGNOSIS — R911 Solitary pulmonary nodule: Secondary | ICD-10-CM | POA: Insufficient documentation

## 2022-08-13 DIAGNOSIS — R7881 Bacteremia: Secondary | ICD-10-CM | POA: Diagnosis not present

## 2022-08-13 DIAGNOSIS — M4644 Discitis, unspecified, thoracic region: Secondary | ICD-10-CM | POA: Diagnosis not present

## 2022-08-13 DIAGNOSIS — N12 Tubulo-interstitial nephritis, not specified as acute or chronic: Secondary | ICD-10-CM | POA: Diagnosis not present

## 2022-08-13 LAB — RENAL FUNCTION PANEL
Albumin: 1.8 g/dL — ABNORMAL LOW (ref 3.5–5.0)
Anion gap: 11 (ref 5–15)
BUN: 20 mg/dL (ref 6–20)
CO2: 24 mmol/L (ref 22–32)
Calcium: 8.5 mg/dL — ABNORMAL LOW (ref 8.9–10.3)
Chloride: 103 mmol/L (ref 98–111)
Creatinine, Ser: 0.76 mg/dL (ref 0.44–1.00)
GFR, Estimated: >60 mL/min (ref >60)
Glucose, Bld: 144 mg/dL — ABNORMAL HIGH (ref 70–99)
Phosphorus: 3.9 mg/dL (ref 2.5–4.6)
Potassium: 3.5 mmol/L (ref 3.5–5.1)
Sodium: 138 mmol/L (ref 135–145)

## 2022-08-13 LAB — CBC
HCT: 22.8 % — ABNORMAL LOW (ref 36.0–46.0)
Hemoglobin: 7.1 g/dL — ABNORMAL LOW (ref 12.0–15.0)
MCH: 22.8 pg — ABNORMAL LOW (ref 26.0–34.0)
MCHC: 31.1 g/dL (ref 30.0–36.0)
MCV: 73.1 fL — ABNORMAL LOW (ref 80.0–100.0)
Platelets: 336 10*3/uL (ref 150–400)
RBC: 3.12 MIL/uL — ABNORMAL LOW (ref 3.87–5.11)
RDW: 15.9 % — ABNORMAL HIGH (ref 11.5–15.5)
WBC: 7.9 10*3/uL (ref 4.0–10.5)
nRBC: 0 % (ref 0.0–0.2)

## 2022-08-13 LAB — CULTURE, BLOOD (ROUTINE X 2): Special Requests: ADEQUATE

## 2022-08-13 LAB — MAGNESIUM: Magnesium: 1.9 mg/dL (ref 1.7–2.4)

## 2022-08-13 MED ORDER — SODIUM CHLORIDE 0.9 % IV SOLN
250.0000 mg | Freq: Every day | INTRAVENOUS | Status: DC
  Administered 2022-08-13: 250 mg via INTRAVENOUS
  Filled 2022-08-13 (×2): qty 20

## 2022-08-13 MED ORDER — SODIUM CHLORIDE 0.9 % IV SOLN
250.0000 mg | Freq: Every day | INTRAVENOUS | Status: DC
  Filled 2022-08-13: qty 20

## 2022-08-13 NOTE — Assessment & Plan Note (Addendum)
L5-S1 discitis/osteomyelitis and epidural phlegmon Likely T12 discitis Pyelonephritis resolved Patient mentating at baseline, temp < 100 F, heart rate < 100bpm, RR normal, WBC normal.    - Continue Rocephin day 4 of 42, EOT 5/28 - Place PICC - Weekly CBC/D, BMP, ESR, CRP while on antibiotics - OPAT in place - ID follow up

## 2022-08-13 NOTE — Assessment & Plan Note (Addendum)
As evidenced by severe fat depletion, severe muscle depletion and reduced energy intake for over a month, weight loss 16% in last 6 weeks and BMI 16

## 2022-08-13 NOTE — Progress Notes (Signed)
  Progress Note   Patient: Denise Hudson ZOX:[id number] DOB: [birth year] DOA: 08/09/2022     3 DOS: the patient was seen and examined on 08/13/2022        Brief hospital course: Mrs. Somera is a 62 y.o. F with HTN, kidney stone recent hospitalization for pyelonephritis and obstructive uropathy s/p stent placement followed by removal 2 weeks ago presenting with worsening low back pain found to have osteomyelitis/discitis and 5 mm pleural-based nodule in LLL.      4/14: Admitted on antibiotics 4/15: ID consulted 4/16: IR aspirated disc space     Assessment and Plan: * Discitis of lumbosacral region See above  E coli bacteremia - Continue Rocephin - Consult ID, appreciate cares  Discitis of thoracic region See above  Pyelonephritis of right kidney - Continue Rocephin  Ureterolithiasis - Follow up with urology  Hyponatremia Stable  Lung nodule 5mm.    Protein-calorie malnutrition, severe As evidenced by severe fat depletion, severe muscle depletion and reduced energy intake for over a month.  Underweight BMI 16  Benign essential HTN - Continue amlodipine  Hypokalemia - Supplement K  Iron deficiency anemia - Supplement iron when cleared by ID          Subjective: Still with back pain, no acute change.     Physical Exam: BP (!) 160/74 (BP Location: Right Arm)   Pulse 68   Temp 99.1 F (37.3 C) (Oral)   Resp 14   Ht 6' (1.829 m)   Wt 54.7 kg   LMP 09/23/2015   SpO2 100%   BMI 16.36 kg/m   Thin adult female, lying in bed, no acute distress RRR, no murmurs, no peripheral edema Respiratory rate normal, lungs clear without rales or wheezes  Data Reviewed: Sodium potassium normal CRP and ESR elevated Telemetry with missed beats, otherwise nonfocal  Family Communication: Brother at the bedside    Disposition: Status is: Inpatient         Author: Alberteen Sam, MD 08/13/2022 5:26 PM  For on call review  [email or web address].

## 2022-08-13 NOTE — Assessment & Plan Note (Addendum)
Nonobstructive - Follow up with urology

## 2022-08-13 NOTE — Assessment & Plan Note (Signed)
Supplemented 

## 2022-08-13 NOTE — Progress Notes (Incomplete)
[facility] Infectious Diseases Follow Up Note  Patient Identification: Patient Name: Denise Hudson MRN: [id number] Admit Date: 08/09/2022  9:30 PM Age: 62 y.o.Today's Date: 08/13/2022  Reason for Visit: bacteremia and vertebral infection   Principal Problem:   Discitis of lumbosacral region Active Problems:   Ureterolithiasis   Pyelonephritis of right kidney   Iron deficiency anemia   Hypokalemia   Benign essential HTN   E coli bacteremia   Discitis of thoracic region   Underweight   Protein-calorie malnutrition, severe   Lung nodule   Hyponatremia  Antibiotics:  Vancomycin 4/14 Cefepime 4/14, ceftriaxone 4/14 Metronidazole 4/14   Lines/Hardware:   Interval Events: T max 100.6 yesterday, s/p disc aspiration yesterday   Assessment 25 Y O female with PMH as below including history of kidney stones, status post March admission with pyelonephritis secondary to obstructing stone s/p  cystoscopy with right ureteral stent placement on March 17, discharged on 14 days of cefadroxil ( 3/16 urine culture growing E. 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 Cx pending  - Repeat blood cx 4/16 pending   # Recent admission for Obstructing stone/pyelonephritis s/p urologic  instrumentation as above    Recommendations Continue ceftriaxone  Fu blood and IR aspirate cx  Hold off on PICC until repeat blood cx are negative for at least 48-72 hrs  Will need 6 weeks of IV abtx from 4/16 Monitor CBC and CMP Following cultures for final recs   Rest of the management as per the primary team. Thank you for the consult. Please page with pertinent questions or  concerns.  ______________________________________________________________________ Subjective patient seen and examined at the bedside. Eating breakfast and has no complaints   Vitals BP (!) 159/66 (BP Location: Right Arm)   Pulse (!) 59   Temp 97.8 F (36.6 C) (Oral)   Resp 14   Ht 6' (1.829 m)   Wt 54.7 kg   LMP 09/23/2015   SpO2 99%   BMI 16.36 kg/m  '   Physical Exam Constitutional: Adult female sitting in having breakfast    Comments:   Cardiovascular:     Rate and Rhythm: Normal rate and regular rhythm.     Heart sounds:  Pulmonary:     Effort: Pulmonary effort is normal on room air    Comments:   Abdominal:     Palpations: Abdomen is soft.     Tenderness: Nondistended  Musculoskeletal:        General: No swelling or tenderness in peripheral joints  Skin:    Comments: No rashes  Neurological:     General: Awake, alert and oriented.  Grossly nonfocal  Psychiatric:        Mood and Affect: Mood normal.   Pertinent Microbiology Results for orders placed or performed during the hospital encounter of 08/09/22  Urine Culture     Status: None   Collection Time: 08/09/22 11:57 PM   Specimen: Urine, Clean Catch  Result Value Ref Range Status   Specimen Description   Final    URINE, CLEAN CATCH Performed at [facility], [address], [place], [place] [address]    Special Requests   Final    NONE Performed at [facility], [address] [place], [place] [address]    Culture   Final    NO GROWTH Performed at [facility], [address], [place], [place] [address]    Report Status 08/11/2022 FINAL  Final  Culture, blood (routine x 2)     Status: Abnormal   Collection Time: 08/10/22 12:34 AM   Specimen: BLOOD LEFT FOREARM  Result Value Ref Range Status   Specimen Description   Final    BLOOD LEFT FOREARM Performed at [facility], [address] [place], [place] [address]    Special  Requests   Final    BOTTLES DRAWN AEROBIC AND ANAEROBIC Blood Culture adequate volume Performed at [facility], [address], [place], [place] [address]    Culture  Setup Time   Final    GRAM NEGATIVE RODS ANAEROBIC BOTTLE ONLY CRITICAL VALUE NOTED.  VALUE IS CONSISTENT WITH PREVIOUSLY REPORTED AND CALLED VALUE.    Culture (A)  Final    ESCHERICHIA COLI SUSCEPTIBILITIES PERFORMED ON PREVIOUS CULTURE WITHIN THE LAST 5 DAYS. Performed at [facility], [address] [place], [place] [address]    Report Status 08/13/2022 FINAL  Final  Culture, blood (routine x 2)     Status: Abnormal   Collection Time: 08/10/22  1:31 AM   Specimen: BLOOD  Result Value Ref Range Status   Specimen Description BLOOD RIGHT ANTECUBITAL  Final   Special Requests   Final    BOTTLES DRAWN AEROBIC AND ANAEROBIC Blood Culture adequate volume   Culture  Setup Time   Final    GRAM NEGATIVE RODS IN BOTH AEROBIC AND ANAEROBIC BOTTLES CRITICAL RESULT CALLED TO, READ BACK BY AND VERIFIED WITH: PHARMD Malva Cogan [id number] AT 1738 BY EC Performed at [facility], [address] [place], [place] [address]    Culture ESCHERICHIA COLI (A)  Final   Report Status 08/12/2022 FINAL  Final   Organism ID, Bacteria ESCHERICHIA COLI  Final      Susceptibility   Escherichia coli - MIC*    AMPICILLIN <=2 SENSITIVE Sensitive     CEFEPIME <=0.12 SENSITIVE Sensitive     CEFTAZIDIME <=1 SENSITIVE Sensitive     CEFTRIAXONE <=0.25 SENSITIVE Sensitive     CIPROFLOXACIN <=0.25 SENSITIVE Sensitive     GENTAMICIN <=1 SENSITIVE Sensitive     IMIPENEM <=0.25 SENSITIVE Sensitive     TRIMETH/SULFA <=20 SENSITIVE Sensitive     AMPICILLIN/SULBACTAM <=2 SENSITIVE Sensitive     PIP/TAZO <=4 SENSITIVE Sensitive     * ESCHERICHIA COLI  Blood Culture ID Panel (Reflexed)     Status: Abnormal   Collection Time: 08/10/22  1:31 AM  Result Value Ref Range Status   Enterococcus faecalis NOT DETECTED NOT DETECTED  Final   Enterococcus Faecium NOT DETECTED NOT DETECTED Final   Listeria monocytogenes NOT DETECTED NOT DETECTED Final   Staphylococcus species NOT DETECTED NOT DETECTED Final   Staphylococcus aureus (BCID) NOT DETECTED NOT DETECTED Final   Staphylococcus epidermidis NOT DETECTED NOT DETECTED Final   Staphylococcus lugdunensis NOT DETECTED NOT DETECTED Final   Streptococcus species NOT DETECTED NOT DETECTED Final   Streptococcus agalactiae NOT DETECTED NOT DETECTED Final   Streptococcus pneumoniae NOT DETECTED NOT DETECTED Final   Streptococcus pyogenes NOT DETECTED NOT DETECTED Final   A.calcoaceticus-baumannii NOT DETECTED NOT DETECTED Final   Bacteroides fragilis NOT DETECTED NOT DETECTED Final   Enterobacterales DETECTED (A) NOT DETECTED Final    Comment: Enterobacterales represent a large  order of gram negative bacteria, not a single organism. CRITICAL RESULT CALLED TO, READ BACK BY AND VERIFIED WITH: PHARMD Malva Cogan [id number] AT 1738 BY EC    Enterobacter cloacae complex NOT DETECTED NOT DETECTED Final   Escherichia coli DETECTED (A) NOT DETECTED Final    Comment: CRITICAL RESULT CALLED TO, READ BACK BY AND VERIFIED WITH: PHARMD Malva Cogan [id number] AT 1738 BY EC    Klebsiella aerogenes NOT DETECTED NOT DETECTED Final   Klebsiella oxytoca NOT DETECTED NOT DETECTED Final   Klebsiella pneumoniae NOT DETECTED NOT DETECTED Final   Proteus species NOT DETECTED NOT DETECTED Final   Salmonella species NOT DETECTED NOT DETECTED Final   Serratia marcescens NOT DETECTED NOT DETECTED Final   Haemophilus influenzae NOT DETECTED NOT DETECTED Final   Neisseria meningitidis NOT DETECTED NOT DETECTED Final   Pseudomonas aeruginosa NOT DETECTED NOT DETECTED Final   Stenotrophomonas maltophilia NOT DETECTED NOT DETECTED Final   Candida albicans NOT DETECTED NOT DETECTED Final   Candida auris NOT DETECTED NOT DETECTED Final   Candida glabrata NOT DETECTED NOT DETECTED Final   Candida krusei NOT  DETECTED NOT DETECTED Final   Candida parapsilosis NOT DETECTED NOT DETECTED Final   Candida tropicalis NOT DETECTED NOT DETECTED Final   Cryptococcus neoformans/gattii NOT DETECTED NOT DETECTED Final   CTX-M ESBL NOT DETECTED NOT DETECTED Final   Carbapenem resistance IMP NOT DETECTED NOT DETECTED Final   Carbapenem resistance KPC NOT DETECTED NOT DETECTED Final   Carbapenem resistance NDM NOT DETECTED NOT DETECTED Final   Carbapenem resist OXA 48 LIKE NOT DETECTED NOT DETECTED Final   Carbapenem resistance VIM NOT DETECTED NOT DETECTED Final    Comment: Performed at [facility], [address], [place], [place] [address]  Culture, blood (Routine X 2) w Reflex to ID Panel     Status: None (Preliminary result)   Collection Time: 08/12/22  5:59 AM   Specimen: BLOOD RIGHT ARM  Result Value Ref Range Status   Specimen Description BLOOD RIGHT ARM  Final   Special Requests   Final    BOTTLES DRAWN AEROBIC AND ANAEROBIC Blood Culture adequate volume   Culture   Final    NO GROWTH 1 DAY Performed at [facility], [address], [place], [place] [address]    Report Status PENDING  Incomplete  Culture, blood (Routine X 2) w Reflex to ID Panel     Status: None (Preliminary result)   Collection Time: 08/12/22  6:04 AM   Specimen: BLOOD LEFT ARM  Result Value Ref Range Status   Specimen Description BLOOD LEFT ARM  Final   Special Requests   Final    BOTTLES DRAWN AEROBIC AND ANAEROBIC Blood Culture adequate volume   Culture   Final    NO GROWTH 1 DAY Performed at [facility], [address] [place], [place] [address]    Report Status PENDING  Incomplete  Aerobic/Anaerobic Culture w Gram Stain (surgical/deep wound)     Status: None (Preliminary result)   Collection Time: 08/12/22 11:12 AM   Specimen: PATH Disc; Body Fluid  Result Value Ref Range Status   Specimen Description OTHER  Final   Special Requests  DISC L5 S1  Final   Gram Stain   Final    NO WBC SEEN NO  ORGANISMS SEEN Performed at [facility], [address], [place], [place] [address]    Culture PENDING  Incomplete   Report Status PENDING  Incomplete   Pertinent Lab.    Latest Ref Rng & Units 08/13/2022    4:14 AM 08/12/2022    5:58 AM 08/11/2022    3:17 AM  CBC  WBC 4.0 - 10.5 K/uL 7.9  7.4  8.5   Hemoglobin 12.0 - 15.0 g/dL 7.1  7.4  7.5   Hematocrit 36.0 - 46.0 % 22.8  23.9  24.8   Platelets 150 - 400 K/uL 336  346  304       Latest Ref Rng & Units 08/13/2022    4:14 AM 08/12/2022    5:58 AM 08/11/2022    3:17 AM  CMP  Glucose 70 - 99 mg/dL 536  644  034   BUN 6 - 20 mg/dL 20  21  18    Creatinine 0.44 - 1.00 mg/dL 7.42  5.95  6.38   Sodium 135 - 145 mmol/L 138  137  137   Potassium 3.5 - 5.1 mmol/L 3.5  3.8  3.5   Chloride 98 - 111 mmol/L 103  101  104   CO2 22 - 32 mmol/L 24  25  25    Calcium 8.9 - 10.3 mg/dL 8.5  8.8  9.0     Pertinent Imaging today Plain films and CT images have been personally visualized and interpreted; radiology reports have been reviewed. Decision making incorporated into the Impression / Recommendations.  IR LUMBAR DISC ASPIRATION W/IMG GUIDE  Result Date: 08/12/2022 INDICATION: 62 year old female with recent history of nephrolithiasis and pyelonephritis related to obstructive uropathy who developed back pain and fever. MRI of the lumbar spine revealed findings suggestive of L5-S1 discitis/osteomyelitis. She presents to our service today for a fluoroscopy guided FNA of the L5-S1 intervertebral disc. EXAM: FLUOROSCOPY GUIDED L5-S1 INTERVERTEBRAL DISC FINE-NEEDLE ASPIRATION MEDICATIONS: The patient is currently admitted to the hospital and receiving intravenous antibiotics. The antibiotics were administered within an appropriate time frame prior to the initiation of the procedure. ANESTHESIA/SEDATION: A total of Versed 1.5 mg and Fentanyl 75 mcg were administered intravenously for moderate conscious sedation monitored under my direct supervision.  Total intraservice time of sedation was 21 minute. The patient's vital signs were monitored throughout the procedure and recorded in the patient's medical record by the radiology nurse. FLUOROSCOPY EXPOSURE: Peak skin dose (PSD) 395 mGy. COMPLICATIONS: None immediate. PROCEDURE: Informed written consent was obtained from the patient after a thorough discussion of the procedural risks, benefits and alternatives. All questions were addressed. Maximal Sterile Barrier Technique was utilized including caps, mask, sterile gowns, sterile gloves, sterile drape, hand hygiene and skin antiseptic. A timeout was performed prior to the initiation of the procedure. The patient was placed in prone position on the angiography table. The lumbar spine region was prepped and draped in a sterile fashion. Under fluoroscopy, the L5-S1 disc space was delineated and the skin area was marked. The skin was infiltrated with a 1% Lidocaine approximately 6 cm lateral to the spinous process projection on the left. Using a 20-gauge spinal needle, the soft issue and the peripedicular space were infiltrated with Bupivacaine 0.5%. Subsequently, the 20 gauge needle was advanced under fluoroscopy guided into the L5-S1 disc space. The mandrel was removed and 2 fine-needle aspiration samples were obtained. Small amount clear fluid was collected in the first sample and blood tinged fluid was collected in the second sample. The needle was subsequently withdrawn. The access sites were cleaned and covered with a sterile bandage. IMPRESSION: Successful fluoroscopy guided L5-S1 disc fine-needle aspiration. Samples collected were sent for Gram  stain and culture. Electronically Signed   By: Baldemar Lenis M.D.   On: 08/12/2022 14:13    I spent at least 40 minutes for this patient encounter including review of prior medical records, coordination of care with primary/other specialist with greater than 50% of time being face to face/counseling and  discussing diagnostics/treatment plan with the patient/family.  Electronically signed by:   Odette Fraction, MD Infectious Disease Physician [facility] Pager: [phone]

## 2022-08-13 NOTE — Plan of Care (Signed)

## 2022-08-13 NOTE — Plan of Care (Signed)
  Problem: Education: Goal: Knowledge of General Education information will improve Description: Including pain rating scale, medication(s)/side effects and non-pharmacologic comfort measures 08/13/2022 2350 by Tracie Harrier, RN Outcome: Progressing 08/13/2022 2350 by Tracie Harrier, RN Outcome: Progressing   Problem: Health Behavior/Discharge Planning: Goal: Ability to manage health-related needs will improve 08/13/2022 2350 by Tracie Harrier, RN Outcome: Progressing 08/13/2022 2350 by Tracie Harrier, RN Outcome: Progressing   Problem: Clinical Measurements: Goal: Ability to maintain clinical measurements within normal limits will improve 08/13/2022 2350 by Tracie Harrier, RN Outcome: Progressing 08/13/2022 2350 by Tracie Harrier, RN Outcome: Progressing Goal: Will remain free from infection 08/13/2022 2350 by Tracie Harrier, RN Outcome: Progressing 08/13/2022 2350 by Tracie Harrier, RN Outcome: Progressing Goal: Diagnostic test results will improve 08/13/2022 2350 by Tracie Harrier, RN Outcome: Progressing 08/13/2022 2350 by Tracie Harrier, RN Outcome: Progressing Goal: Respiratory complications will improve 08/13/2022 2350 by Tracie Harrier, RN Outcome: Progressing 08/13/2022 2350 by Tracie Harrier, RN Outcome: Progressing Goal: Cardiovascular complication will be avoided 08/13/2022 2350 by Tracie Harrier, RN Outcome: Progressing 08/13/2022 2350 by Tracie Harrier, RN Outcome: Progressing   Problem: Activity: Goal: Risk for activity intolerance will decrease 08/13/2022 2350 by Tracie Harrier, RN Outcome: Progressing 08/13/2022 2350 by Tracie Harrier, RN Outcome: Progressing   Problem: Nutrition: Goal: Adequate nutrition will be maintained 08/13/2022 2350 by Tracie Harrier, RN Outcome: Progressing 08/13/2022 2350 by Tracie Harrier, RN Outcome: Progressing   Problem: Coping: Goal: Level of anxiety will decrease 08/13/2022 2350 by Tracie Harrier, RN Outcome:  Progressing 08/13/2022 2350 by Tracie Harrier, RN Outcome: Progressing   Problem: Elimination: Goal: Will not experience complications related to bowel motility 08/13/2022 2350 by Tracie Harrier, RN Outcome: Progressing 08/13/2022 2350 by Tracie Harrier, RN Outcome: Progressing Goal: Will not experience complications related to urinary retention 08/13/2022 2350 by Tracie Harrier, RN Outcome: Progressing 08/13/2022 2350 by Tracie Harrier, RN Outcome: Progressing   Problem: Pain Managment: Goal: General experience of comfort will improve 08/13/2022 2350 by Tracie Harrier, RN Outcome: Progressing 08/13/2022 2350 by Tracie Harrier, RN Outcome: Progressing   Problem: Safety: Goal: Ability to remain free from injury will improve 08/13/2022 2350 by Tracie Harrier, RN Outcome: Progressing 08/13/2022 2350 by Tracie Harrier, RN Outcome: Progressing   Problem: Skin Integrity: Goal: Risk for impaired skin integrity will decrease 08/13/2022 2350 by Tracie Harrier, RN Outcome: Progressing 08/13/2022 2350 by Tracie Harrier, RN Outcome: Progressing

## 2022-08-13 NOTE — Assessment & Plan Note (Signed)
See above

## 2022-08-13 NOTE — Assessment & Plan Note (Signed)
Stable

## 2022-08-13 NOTE — Assessment & Plan Note (Addendum)
IV iron deferred given bacteremia/discitis.  Discussed with ID. Anemia asymptomatic at present. Hgb up to 7.8 today, oral iron started 4/18  - Needs outpatient GI referral - Continue oral iron - Trend Hgb - Transfusion threshold 7 g/dL

## 2022-08-13 NOTE — Assessment & Plan Note (Signed)
BMI 16

## 2022-08-13 NOTE — Assessment & Plan Note (Signed)
-   Continue Rocephin 

## 2022-08-13 NOTE — Assessment & Plan Note (Signed)
5 mm

## 2022-08-13 NOTE — Assessment & Plan Note (Addendum)
BP improved - Continue new amlodipine and HCTZ

## 2022-08-14 DIAGNOSIS — N12 Tubulo-interstitial nephritis, not specified as acute or chronic: Secondary | ICD-10-CM | POA: Diagnosis not present

## 2022-08-14 DIAGNOSIS — M4644 Discitis, unspecified, thoracic region: Secondary | ICD-10-CM | POA: Diagnosis not present

## 2022-08-14 DIAGNOSIS — M4647 Discitis, unspecified, lumbosacral region: Secondary | ICD-10-CM | POA: Diagnosis not present

## 2022-08-14 DIAGNOSIS — R7881 Bacteremia: Secondary | ICD-10-CM | POA: Diagnosis not present

## 2022-08-14 LAB — CBC
HCT: 22.9 % — ABNORMAL LOW (ref 36.0–46.0)
Hemoglobin: 7 g/dL — ABNORMAL LOW (ref 12.0–15.0)
MCH: 22.4 pg — ABNORMAL LOW (ref 26.0–34.0)
MCHC: 30.6 g/dL (ref 30.0–36.0)
MCV: 73.4 fL — ABNORMAL LOW (ref 80.0–100.0)
Platelets: 359 10*3/uL (ref 150–400)
RBC: 3.12 MIL/uL — ABNORMAL LOW (ref 3.87–5.11)
RDW: 16 % — ABNORMAL HIGH (ref 11.5–15.5)
WBC: 8.1 10*3/uL (ref 4.0–10.5)
nRBC: 0 % (ref 0.0–0.2)

## 2022-08-14 LAB — CULTURE, BLOOD (ROUTINE X 2)
Culture: NO GROWTH
Special Requests: ADEQUATE

## 2022-08-14 LAB — AEROBIC/ANAEROBIC CULTURE W GRAM STAIN (SURGICAL/DEEP WOUND)

## 2022-08-14 LAB — ABO/RH: ABO/RH(D): A POS

## 2022-08-14 MED ORDER — FERROUS SULFATE 325 (65 FE) MG PO TABS
325.0000 mg | ORAL_TABLET | ORAL | Status: DC
  Administered 2022-08-14 – 2022-08-18 (×3): 325 mg via ORAL
  Filled 2022-08-14 (×4): qty 1

## 2022-08-14 MED ORDER — HYDROCHLOROTHIAZIDE 12.5 MG PO TABS
12.5000 mg | ORAL_TABLET | Freq: Every day | ORAL | Status: DC
  Administered 2022-08-14 – 2022-08-18 (×5): 12.5 mg via ORAL
  Filled 2022-08-14 (×5): qty 1

## 2022-08-14 NOTE — TOC Progression Note (Signed)
Transition of Care [facility]) - Progression Note    Patient Details  Name: Denise Hudson MRN: [id number] Date of Birth: Mar 31, 1961  Transition of Care [facility] CM/SW Contact  Kermit Balo, RN Phone Number: 08/14/2022, 3:57 PM  Clinical Narrative:    Per MD pt is going to require 6 weeks of IV abx. CM has updated [name] with [facility]. Awaiting PT/OT evals.  TOC following.   Expected Discharge Plan: Home/Self Care Barriers to Discharge: Continued Medical Work up  Expected Discharge Plan and Services   Discharge Planning Services: CM Consult   Living arrangements for the past 2 months: Single Family Home                                       Social Determinants of Health (SDOH) Interventions SDOH Screenings   Food Insecurity: No Food Insecurity (08/10/2022)  Housing: Low Risk  (08/10/2022)  Transportation Needs: No Transportation Needs (08/10/2022)  Utilities: Not At Risk (08/10/2022)  Tobacco Use: Medium Risk (08/12/2022)    Readmission Risk Interventions     No data to display

## 2022-08-14 NOTE — Plan of Care (Signed)
  Problem: Education: Goal: Knowledge of General Education information will improve Description Including pain rating scale, medication(s)/side effects and non-pharmacologic comfort measures Outcome: Progressing   Problem: Health Behavior/Discharge Planning: Goal: Ability to manage health-related needs will improve Outcome: Progressing   

## 2022-08-14 NOTE — Progress Notes (Signed)
  Progress Note   Patient: Denise Hudson WGN:[id number] DOB: 06-11-1960 DOA: 08/09/2022     4 DOS: the patient was seen and examined on 08/14/2022 at 11:15AM      Brief hospital course: Denise Hudson is a 62 y.o. F with HTN, kidney stone recent hospitalization for pyelonephritis and obstructive uropathy s/p stent placement followed by removal 2 weeks ago presenting with worsening low back pain found to have osteomyelitis/discitis and 5 mm pleural-based nodule in LLL.      4/14: Admitted on antibiotics 4/15: ID consulted 4/16: IR aspirated disc space     Assessment and Plan: * Discitis of lumbosacral region See above  E coli bacteremia - Continue Rocephin day 2 of 42 - Follow 4/16 cultures, so far no growth - Place PICC on 4/19 if 4/16 cultures remain NG - Outpatient ID follow up for OPAT, timing of repeat imaging, lab maintenance    Discitis of thoracic region See above  Pyelonephritis of right kidney - Continue Rocephin  Ureterolithiasis - Follow up with urology  Hyponatremia Stable  Lung nodule 5mm.    Protein-calorie malnutrition, severe As evidenced by severe fat depletion, severe muscle depletion and reduced energy intake for over a month.  Underweight BMI 16  Benign essential HTN BP persistently elevated - Start HCTZ - Continue amlodipine  Hypokalemia Supplemented  Iron deficiency anemia IV iron deferred given blood stream infection. Asymptomatic at present. Hgb trending down - Start oral iron - Transfusion threshold 7 g/dL          Subjective: Patient still with severe pain, still requiring IV Dilaudid at least 2 times per day.  No fever, no confusion     Physical Exam: BP (!) 149/67 (BP Location: Right Arm)   Pulse 64   Temp 98.2 F (36.8 C) (Oral)   Resp 17   Ht 6' (1.829 m)   Wt 54.7 kg   LMP 09/23/2015   SpO2 98%   BMI 16.36 kg/m   Thin adult female, lying in bed, appears weak and tired RRR, no murmurs, no peripheral  edema Respiratory rate normal, lungs clear without rales or wheezes Abdomen soft without tenderness palpation or guarding, no ascites or distention   Data Reviewed: Hemoglobin down to 7 Aspirate growing gram-negative rods Albumin low   Family Communication: None present    Disposition: Status is: Inpatient PT eval pending        Author: Alberteen Sam, MD 08/14/2022 2:00 PM  For on call review [email or web address].

## 2022-08-14 NOTE — Progress Notes (Signed)
Results for orders placed or performed during the hospital encounter of 08/09/22  Urine Culture     Status: None   Collection Time: 08/09/22 11:57 PM   Specimen: Urine, Clean Catch  Result Value Ref Range Status   Specimen Description   Final    URINE, CLEAN CATCH Performed at [facility], [address], [place], [place] [address]    Special Requests   Final    NONE Performed at [facility], [address], [place], [place] [address]    Culture   Final    NO GROWTH Performed at [facility], [address] [place], [place] [address]    Report Status 08/11/2022 FINAL  Final  Culture, blood (routine x 2)     Status: Abnormal   Collection Time: 08/10/22 12:34 AM   Specimen: BLOOD LEFT FOREARM  Result Value Ref Range Status   Specimen Description   Final    BLOOD LEFT FOREARM Performed at [facility], [address], [place], [place] [address]    Special Requests   Final    BOTTLES DRAWN AEROBIC AND ANAEROBIC Blood Culture adequate volume Performed at [facility], [address], [place], [place] [address]    Culture  Setup Time   Final    GRAM NEGATIVE RODS ANAEROBIC BOTTLE ONLY CRITICAL VALUE NOTED.  VALUE IS CONSISTENT WITH PREVIOUSLY REPORTED AND CALLED VALUE.    Culture (A)  Final    ESCHERICHIA COLI SUSCEPTIBILITIES PERFORMED ON PREVIOUS CULTURE WITHIN THE LAST 5 DAYS. Performed at [facility], [address], [place], [place] [address]    Report Status 08/13/2022 FINAL  Final  Culture, blood (routine x 2)     Status: Abnormal   Collection Time: 08/10/22  1:31 AM   Specimen: BLOOD  Result Value Ref Range Status   Specimen Description BLOOD RIGHT ANTECUBITAL  Final   Special Requests   Final    BOTTLES DRAWN AEROBIC AND ANAEROBIC Blood Culture adequate volume   Culture  Setup Time   Final    GRAM NEGATIVE RODS IN BOTH AEROBIC AND ANAEROBIC BOTTLES CRITICAL RESULT CALLED  TO, READ BACK BY AND VERIFIED WITH: PHARMD Malva Cogan [id number] AT 1738 BY EC Performed at [facility], [address], [place], [place] [address]    Culture ESCHERICHIA COLI (A)  Final   Report Status 08/12/2022 FINAL  Final   Organism ID, Bacteria ESCHERICHIA COLI  Final      Susceptibility   Escherichia coli - MIC*    AMPICILLIN <=2 SENSITIVE Sensitive     CEFEPIME <=0.12 SENSITIVE Sensitive     CEFTAZIDIME <=1 SENSITIVE Sensitive     CEFTRIAXONE <=0.25 SENSITIVE Sensitive     CIPROFLOXACIN <=0.25 SENSITIVE Sensitive     GENTAMICIN <=1 SENSITIVE Sensitive     IMIPENEM <=0.25 SENSITIVE Sensitive     TRIMETH/SULFA <=20 SENSITIVE Sensitive     AMPICILLIN/SULBACTAM <=2 SENSITIVE Sensitive     PIP/TAZO <=4 SENSITIVE Sensitive     * ESCHERICHIA COLI  Blood Culture ID Panel (Reflexed)     Status: Abnormal   Collection Time: 08/10/22  1:31 AM  Result Value Ref Range Status   Enterococcus faecalis NOT DETECTED NOT DETECTED Final   Enterococcus Faecium NOT DETECTED NOT DETECTED Final   Listeria monocytogenes NOT DETECTED NOT DETECTED Final   Staphylococcus species NOT DETECTED NOT DETECTED Final   Staphylococcus aureus (BCID) NOT DETECTED NOT DETECTED Final  Staphylococcus epidermidis NOT DETECTED NOT DETECTED Final   Staphylococcus lugdunensis NOT DETECTED NOT DETECTED Final   Streptococcus species NOT DETECTED NOT DETECTED Final   Streptococcus agalactiae NOT DETECTED NOT DETECTED Final   Streptococcus pneumoniae NOT DETECTED NOT DETECTED Final   Streptococcus pyogenes NOT DETECTED NOT DETECTED Final   A.calcoaceticus-baumannii NOT DETECTED NOT DETECTED Final   Bacteroides fragilis NOT DETECTED NOT DETECTED Final   Enterobacterales DETECTED (A) NOT DETECTED Final    Comment: Enterobacterales represent a large order of gram negative bacteria, not a single organism. CRITICAL RESULT CALLED TO, READ BACK BY AND VERIFIED WITH: PHARMD Malva Cogan [id number] AT 1738 BY EC     Enterobacter cloacae complex NOT DETECTED NOT DETECTED Final   Escherichia coli DETECTED (A) NOT DETECTED Final    Comment: CRITICAL RESULT CALLED TO, READ BACK BY AND VERIFIED WITH: PHARMD Malva Cogan [phone] AT 1738 BY EC    Klebsiella aerogenes NOT DETECTED NOT DETECTED Final   Klebsiella oxytoca NOT DETECTED NOT DETECTED Final   Klebsiella pneumoniae NOT DETECTED NOT DETECTED Final   Proteus species NOT DETECTED NOT DETECTED Final   Salmonella species NOT DETECTED NOT DETECTED Final   Serratia marcescens NOT DETECTED NOT DETECTED Final   Haemophilus influenzae NOT DETECTED NOT DETECTED Final   Neisseria meningitidis NOT DETECTED NOT DETECTED Final   Pseudomonas aeruginosa NOT DETECTED NOT DETECTED Final   Stenotrophomonas maltophilia NOT DETECTED NOT DETECTED Final   Candida albicans NOT DETECTED NOT DETECTED Final   Candida auris NOT DETECTED NOT DETECTED Final   Candida glabrata NOT DETECTED NOT DETECTED Final   Candida krusei NOT DETECTED NOT DETECTED Final   Candida parapsilosis NOT DETECTED NOT DETECTED Final   Candida tropicalis NOT DETECTED NOT DETECTED Final   Cryptococcus neoformans/gattii NOT DETECTED NOT DETECTED Final   CTX-M ESBL NOT DETECTED NOT DETECTED Final   Carbapenem resistance IMP NOT DETECTED NOT DETECTED Final   Carbapenem resistance KPC NOT DETECTED NOT DETECTED Final   Carbapenem resistance NDM NOT DETECTED NOT DETECTED Final   Carbapenem resist OXA 48 LIKE NOT DETECTED NOT DETECTED Final   Carbapenem resistance VIM NOT DETECTED NOT DETECTED Final    Comment: Performed at [facility], [address], [place], [place] [address]  Culture, blood (Routine X 2) w Reflex to ID Panel     Status: None (Preliminary result)   Collection Time: 08/12/22  5:59 AM   Specimen: BLOOD RIGHT ARM  Result Value Ref Range Status   Specimen Description BLOOD RIGHT ARM  Final   Special Requests   Final    BOTTLES DRAWN AEROBIC AND ANAEROBIC Blood Culture adequate volume    Culture   Final    NO GROWTH 2 DAYS Performed at [facility], [address], [place], [place] [address]    Report Status PENDING  Incomplete  Culture, blood (Routine X 2) w Reflex to ID Panel     Status: None (Preliminary result)   Collection Time: 08/12/22  6:04 AM   Specimen: BLOOD LEFT ARM  Result Value Ref Range Status   Specimen Description BLOOD LEFT ARM  Final   Special Requests   Final    BOTTLES DRAWN AEROBIC AND ANAEROBIC Blood Culture adequate volume   Culture   Final    NO GROWTH 2 DAYS Performed at [facility], [address], [place], [place] [address]    Report Status PENDING  Incomplete  Aerobic/Anaerobic Culture w Gram Stain (surgical/deep wound)  Status: None (Preliminary result)   Collection Time: 08/12/22 11:12 AM   Specimen: PATH Disc; Body Fluid  Result Value Ref Range Status   Specimen Description OTHER  Final   Special Requests  DISC L5 S1  Final   Gram Stain   Final    NO WBC SEEN NO ORGANISMS SEEN Performed at [facility], [address], [place], [place] [address]    Culture   Final    MODERATE ESCHERICHIA COLI NO ANAEROBES ISOLATED; CULTURE IN PROGRESS FOR 5 DAYS    Report Status PENDING  Incomplete   Organism ID, Bacteria ESCHERICHIA COLI  Final      Susceptibility   Escherichia coli - MIC*    AMPICILLIN <=2 SENSITIVE Sensitive     CEFEPIME <=0.12 SENSITIVE Sensitive     CEFTAZIDIME <=1 SENSITIVE Sensitive     CEFTRIAXONE <=0.25 SENSITIVE Sensitive     CIPROFLOXACIN <=0.25 SENSITIVE Sensitive     GENTAMICIN <=1 SENSITIVE Sensitive     IMIPENEM <=0.25 SENSITIVE Sensitive     TRIMETH/SULFA <=20 SENSITIVE Sensitive     AMPICILLIN/SULBACTAM <=2 SENSITIVE Sensitive     PIP/TAZO <=4 SENSITIVE Sensitive     * MODERATE ESCHERICHIA COLI

## 2022-08-15 ENCOUNTER — Other Ambulatory Visit: Payer: Self-pay

## 2022-08-15 DIAGNOSIS — R7881 Bacteremia: Secondary | ICD-10-CM | POA: Diagnosis not present

## 2022-08-15 DIAGNOSIS — M4647 Discitis, unspecified, lumbosacral region: Secondary | ICD-10-CM | POA: Diagnosis not present

## 2022-08-15 DIAGNOSIS — M4644 Discitis, unspecified, thoracic region: Secondary | ICD-10-CM | POA: Diagnosis not present

## 2022-08-15 DIAGNOSIS — N201 Calculus of ureter: Secondary | ICD-10-CM | POA: Diagnosis not present

## 2022-08-15 DIAGNOSIS — E43 Unspecified severe protein-calorie malnutrition: Secondary | ICD-10-CM

## 2022-08-15 LAB — BASIC METABOLIC PANEL
Anion gap: 11 (ref 5–15)
BUN: 14 mg/dL (ref 6–20)
CO2: 25 mmol/L (ref 22–32)
Calcium: 8.9 mg/dL (ref 8.9–10.3)
Chloride: 99 mmol/L (ref 98–111)
Creatinine, Ser: 0.7 mg/dL (ref 0.44–1.00)
GFR, Estimated: >60 mL/min (ref >60)
Glucose, Bld: 100 mg/dL — ABNORMAL HIGH (ref 70–99)
Potassium: 4 mmol/L (ref 3.5–5.1)
Sodium: 135 mmol/L (ref 135–145)

## 2022-08-15 LAB — CBC
HCT: 25.1 % — ABNORMAL LOW (ref 36.0–46.0)
Hemoglobin: 7.8 g/dL — ABNORMAL LOW (ref 12.0–15.0)
MCH: 22.2 pg — ABNORMAL LOW (ref 26.0–34.0)
MCHC: 31.1 g/dL (ref 30.0–36.0)
MCV: 71.5 fL — ABNORMAL LOW (ref 80.0–100.0)
Platelets: 412 10*3/uL — ABNORMAL HIGH (ref 150–400)
RBC: 3.51 MIL/uL — ABNORMAL LOW (ref 3.87–5.11)
RDW: 16.3 % — ABNORMAL HIGH (ref 11.5–15.5)
WBC: 8.2 10*3/uL (ref 4.0–10.5)
nRBC: 0 % (ref 0.0–0.2)

## 2022-08-15 LAB — CULTURE, BLOOD (ROUTINE X 2)

## 2022-08-15 LAB — TYPE AND SCREEN
ABO/RH(D): A POS
Antibody Screen: NEGATIVE

## 2022-08-15 MED ORDER — CEFTRIAXONE IV (FOR PTA / DISCHARGE USE ONLY)
2.0000 g | INTRAVENOUS | 0 refills | Status: AC
Start: 2022-08-15 — End: 2022-09-23

## 2022-08-15 NOTE — Progress Notes (Addendum)
[facility] Infectious Diseases Follow Up Note  Patient Identification: Patient Name: Denise Hudson MRN: [id number] Admit Date: 08/09/2022  9:30 PM Age: 62 y.o.Today's Date: 08/15/2022  Reason for Visit: bacteremia and vertebral infection   Principal Problem:   Discitis of lumbosacral region Active Problems:   Ureterolithiasis   Pyelonephritis of right kidney   Iron deficiency anemia   Hypokalemia   Benign essential HTN   E coli bacteremia   Discitis of thoracic region   Underweight   Protein-calorie malnutrition, severe   Lung nodule   Hyponatremia  Antibiotics:  Vancomycin 4/14 Cefepime 4/14, ceftriaxone 4/14 Metronidazole 4/14   Lines/Hardware:   Interval Events: afebrile  Assessment 35 Y O female with PMH as below including history of kidney stones, status post March admission with pyelonephritis secondary to obstructing stone s/p  cystoscopy with right ureteral stent placement on March 17, discharged on 14 days of cefadroxil ( 3/16 urine culture growing E. 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 Cx  E coli  - Repeat blood cx 4/16 NG in 3 days   # Recent admission for Obstructing stone/pyelonephritis s/p urologic  instrumentation as above   Recommendations OK to place PICC  Will need 6 weeks of IV ceftriaxone from 4/16 and reassess for +/- need of repeat imaging for epidural phlegmon  Monitor CBC and CMP ID pharmacy to place OPAT orders Follow-up disc aspirate cultures to completion, otherwise ID will sign off  OPAT  Diagnosis: discitis/osteomyelitis and epidural abscess  Culture Result: E. coli  No Known Allergies  OPAT  Orders Discharge antibiotics to be given via PICC line Discharge antibiotics: IV ceftriaxone Per pharmacy protocol  Aim for Vancomycin trough 15-20 or AUC 400-550 (unless otherwise indicated) Duration: 6 weeks End Date: 09/23/22  [facility] Care Per Protocol:  Home health RN for IV administration and teaching; PICC line care and labs.    Labs weekly while on IV antibiotics: X__ CBC with differential __ BMP X__ CMP X__ CRP X__ ESR __ Vancomycin trough __ CK  __ Please pull PIC at completion of IV antibiotics X__ Please leave PIC in place until doctor has seen patient or been notified  Fax weekly labs to [phone]  Clinic Follow Up Appt:  09/09/22  Rest of the management as per the primary team. Thank you for the consult. Please page with pertinent questions or concerns.  ______________________________________________________________________ Subjective patient seen and examined at the bedside. Eating breakfast and has no complaints   Vitals BP (!) 141/72 (BP Location: Right Arm)   Pulse 72   Temp 98.6 F (37 C) (Oral)   Resp 16   Ht 6' (1.829 m)   Wt 54.7 kg   LMP 09/23/2015   SpO2 100%   BMI 16.36 kg/m  '   Physical Exam Constitutional: Adult female sitting in having breakfast    Comments:   Cardiovascular:     Rate and Rhythm: Normal rate and regular rhythm.     Heart sounds:  Pulmonary:     Effort: Pulmonary effort is normal on room air    Comments:   Abdominal:     Palpations: Abdomen is soft.     Tenderness: Nondistended  Musculoskeletal:  General: No swelling or tenderness in peripheral joints  Skin:    Comments: No rashes  Neurological:     General: Awake, alert and oriented.  Grossly nonfocal  Psychiatric:        Mood and Affect: Mood normal.   Pertinent Microbiology Results for orders placed or performed during the hospital encounter of 08/09/22  Urine Culture     Status: None   Collection Time: 08/09/22 11:57 PM   Specimen:  Urine, Clean Catch  Result Value Ref Range Status   Specimen Description   Final    URINE, CLEAN CATCH Performed at [facility], [address] [place], [place] [address]    Special Requests   Final    NONE Performed at [facility], [address] [place], [place] [address]    Culture   Final    NO GROWTH Performed at [facility], [address] [place], [place] [address]    Report Status 08/11/2022 FINAL  Final  Culture, blood (routine x 2)     Status: Abnormal   Collection Time: 08/10/22 12:34 AM   Specimen: BLOOD LEFT FOREARM  Result Value Ref Range Status   Specimen Description   Final    BLOOD LEFT FOREARM Performed at [facility], [address], [place], [place] [address]    Special Requests   Final    BOTTLES DRAWN AEROBIC AND ANAEROBIC Blood Culture adequate volume Performed at [facility], [address], [place], [place] [address]    Culture  Setup Time   Final    GRAM NEGATIVE RODS ANAEROBIC BOTTLE ONLY CRITICAL VALUE NOTED.  VALUE IS CONSISTENT WITH PREVIOUSLY REPORTED AND CALLED VALUE.    Culture (A)  Final    ESCHERICHIA COLI SUSCEPTIBILITIES PERFORMED ON PREVIOUS CULTURE WITHIN THE LAST 5 DAYS. Performed at [facility], [address], [place], [place] [address]    Report Status 08/13/2022 FINAL  Final  Culture, blood (routine x 2)     Status: Abnormal   Collection Time: 08/10/22  1:31 AM   Specimen: BLOOD  Result Value Ref Range Status   Specimen Description BLOOD RIGHT ANTECUBITAL  Final   Special Requests   Final    BOTTLES DRAWN AEROBIC AND ANAEROBIC Blood Culture adequate volume   Culture  Setup Time   Final    GRAM NEGATIVE RODS IN BOTH AEROBIC AND ANAEROBIC BOTTLES CRITICAL RESULT CALLED TO, READ BACK BY AND VERIFIED WITH: PHARMD Malva Cogan [id number] AT 1738 BY EC Performed at [facility], [address] [place], [place] [address]    Culture  ESCHERICHIA COLI (A)  Final   Report Status 08/12/2022 FINAL  Final   Organism ID, Bacteria ESCHERICHIA COLI  Final      Susceptibility   Escherichia coli - MIC*    AMPICILLIN <=2 SENSITIVE Sensitive     CEFEPIME <=0.12 SENSITIVE Sensitive     CEFTAZIDIME <=1 SENSITIVE Sensitive     CEFTRIAXONE <=0.25 SENSITIVE Sensitive     CIPROFLOXACIN <=0.25 SENSITIVE Sensitive     GENTAMICIN <=1 SENSITIVE Sensitive     IMIPENEM <=0.25 SENSITIVE Sensitive     TRIMETH/SULFA <=20 SENSITIVE Sensitive     AMPICILLIN/SULBACTAM <=2 SENSITIVE Sensitive     PIP/TAZO <=4 SENSITIVE Sensitive     * ESCHERICHIA COLI  Blood Culture ID Panel (Reflexed)     Status: Abnormal   Collection Time: 08/10/22  1:31 AM  Result Value Ref Range  Status   Enterococcus faecalis NOT DETECTED NOT DETECTED Final   Enterococcus Faecium NOT DETECTED NOT DETECTED Final   Listeria monocytogenes NOT DETECTED NOT DETECTED Final   Staphylococcus species NOT DETECTED NOT DETECTED Final   Staphylococcus aureus (BCID) NOT DETECTED NOT DETECTED Final   Staphylococcus epidermidis NOT DETECTED NOT DETECTED Final   Staphylococcus lugdunensis NOT DETECTED NOT DETECTED Final   Streptococcus species NOT DETECTED NOT DETECTED Final   Streptococcus agalactiae NOT DETECTED NOT DETECTED Final   Streptococcus pneumoniae NOT DETECTED NOT DETECTED Final   Streptococcus pyogenes NOT DETECTED NOT DETECTED Final   A.calcoaceticus-baumannii NOT DETECTED NOT DETECTED Final   Bacteroides fragilis NOT DETECTED NOT DETECTED Final   Enterobacterales DETECTED (A) NOT DETECTED Final    Comment: Enterobacterales represent a large order of gram negative bacteria, not a single organism. CRITICAL RESULT CALLED TO, READ BACK BY AND VERIFIED WITH: PHARMD Malva Cogan [id number] AT 1738 BY EC    Enterobacter cloacae complex NOT DETECTED NOT DETECTED Final   Escherichia coli DETECTED (A) NOT DETECTED Final    Comment: CRITICAL RESULT CALLED TO, READ BACK BY AND  VERIFIED WITH: PHARMD Malva Cogan [phone] AT 1738 BY EC    Klebsiella aerogenes NOT DETECTED NOT DETECTED Final   Klebsiella oxytoca NOT DETECTED NOT DETECTED Final   Klebsiella pneumoniae NOT DETECTED NOT DETECTED Final   Proteus species NOT DETECTED NOT DETECTED Final   Salmonella species NOT DETECTED NOT DETECTED Final   Serratia marcescens NOT DETECTED NOT DETECTED Final   Haemophilus influenzae NOT DETECTED NOT DETECTED Final   Neisseria meningitidis NOT DETECTED NOT DETECTED Final   Pseudomonas aeruginosa NOT DETECTED NOT DETECTED Final   Stenotrophomonas maltophilia NOT DETECTED NOT DETECTED Final   Candida albicans NOT DETECTED NOT DETECTED Final   Candida auris NOT DETECTED NOT DETECTED Final   Candida glabrata NOT DETECTED NOT DETECTED Final   Candida krusei NOT DETECTED NOT DETECTED Final   Candida parapsilosis NOT DETECTED NOT DETECTED Final   Candida tropicalis NOT DETECTED NOT DETECTED Final   Cryptococcus neoformans/gattii NOT DETECTED NOT DETECTED Final   CTX-M ESBL NOT DETECTED NOT DETECTED Final   Carbapenem resistance IMP NOT DETECTED NOT DETECTED Final   Carbapenem resistance KPC NOT DETECTED NOT DETECTED Final   Carbapenem resistance NDM NOT DETECTED NOT DETECTED Final   Carbapenem resist OXA 48 LIKE NOT DETECTED NOT DETECTED Final   Carbapenem resistance VIM NOT DETECTED NOT DETECTED Final    Comment: Performed at [facility], [address] [place], [place] [address]  Culture, blood (Routine X 2) w Reflex to ID Panel     Status: None (Preliminary result)   Collection Time: 08/12/22  5:59 AM   Specimen: BLOOD RIGHT ARM  Result Value Ref Range Status   Specimen Description BLOOD RIGHT ARM  Final   Special Requests   Final    BOTTLES DRAWN AEROBIC AND ANAEROBIC Blood Culture adequate volume   Culture   Final    NO GROWTH 3 DAYS Performed at [facility], [address], [place], [place] [address]    Report Status PENDING  Incomplete  Culture,  blood (Routine X 2) w Reflex to ID Panel     Status: None (Preliminary result)   Collection Time: 08/12/22  6:04 AM   Specimen: BLOOD LEFT ARM  Result Value Ref Range Status   Specimen Description BLOOD LEFT ARM  Final   Special Requests   Final    BOTTLES DRAWN AEROBIC AND  ANAEROBIC Blood Culture adequate volume   Culture   Final    NO GROWTH 3 DAYS Performed at [facility], [address], [place], [place] [address]    Report Status PENDING  Incomplete  Aerobic/Anaerobic Culture w Gram Stain (surgical/deep wound)     Status: None (Preliminary result)   Collection Time: 08/12/22 11:12 AM   Specimen: PATH Disc; Body Fluid  Result Value Ref Range Status   Specimen Description OTHER  Final   Special Requests  DISC L5 S1  Final   Gram Stain   Final    NO WBC SEEN NO ORGANISMS SEEN Performed at [facility], [address] [place], [place] [address]    Culture   Final    MODERATE ESCHERICHIA COLI NO ANAEROBES ISOLATED; CULTURE IN PROGRESS FOR 5 DAYS    Report Status PENDING  Incomplete   Organism ID, Bacteria ESCHERICHIA COLI  Final      Susceptibility   Escherichia coli - MIC*    AMPICILLIN <=2 SENSITIVE Sensitive     CEFEPIME <=0.12 SENSITIVE Sensitive     CEFTAZIDIME <=1 SENSITIVE Sensitive     CEFTRIAXONE <=0.25 SENSITIVE Sensitive     CIPROFLOXACIN <=0.25 SENSITIVE Sensitive     GENTAMICIN <=1 SENSITIVE Sensitive     IMIPENEM <=0.25 SENSITIVE Sensitive     TRIMETH/SULFA <=20 SENSITIVE Sensitive     AMPICILLIN/SULBACTAM <=2 SENSITIVE Sensitive     PIP/TAZO <=4 SENSITIVE Sensitive     * MODERATE ESCHERICHIA COLI   Pertinent Lab.    Latest Ref Rng & Units 08/15/2022    4:47 AM 08/14/2022    3:13 AM 08/13/2022    4:14 AM  CBC  WBC 4.0 - 10.5 K/uL 8.2  8.1  7.9   Hemoglobin 12.0 - 15.0 g/dL 7.8  7.0  7.1   Hematocrit 36.0 - 46.0 % 25.1  22.9  22.8   Platelets 150 - 400 K/uL 412  359  336       Latest Ref Rng & Units 08/15/2022    4:47 AM 08/13/2022     4:14 AM 08/12/2022    5:58 AM  CMP  Glucose 70 - 99 mg/dL 829  562  130   BUN 6 - 20 mg/dL 14  20  21    Creatinine 0.44 - 1.00 mg/dL 8.65  7.84  6.96   Sodium 135 - 145 mmol/L 135  138  137   Potassium 3.5 - 5.1 mmol/L 4.0  3.5  3.8   Chloride 98 - 111 mmol/L 99  103  101   CO2 22 - 32 mmol/L 25  24  25    Calcium 8.9 - 10.3 mg/dL 8.9  8.5  8.8     Pertinent Imaging today [place] films and CT images have been personally visualized and interpreted; radiology reports have been reviewed. Decision making incorporated into the Impression / Recommendations.  No results found.  I spent at least 40 minutes for this patient encounter including review of prior medical records, coordination of care with primary/other specialist with greater than 50% of time being face to face/counseling and discussing diagnostics/treatment plan with the patient/family.  Electronically signed by:   Odette Fraction, MD Infectious Disease Physician [facility] Pager: [phone]

## 2022-08-15 NOTE — Evaluation (Signed)
Physical Therapy Evaluation Patient Details Name: Denise Hudson MRN: [id number] DOB: 10/27/1960 Today's Date: 08/15/2022  History of Present Illness  Pt is a 62 y/o F admitted on 08/09/22. Pt with recent hospitalization for pyelonephritis & obstructive uropathy s/p stent placement followed by removal 2 weeks ago. Pt then presented to ED with c/o LBP & found to have osteomyelitis/discitis & 5 mm pleural-based nodule in LLL. Pt admitted on antibiotics, ID was consulted, & IR aspirated disc space on 08/12/22. PMH: HTN, kidney stone  Clinical Impression  Pt seen for PT evaluation with pt agreeable to tx. Pt reports prior to admission she was independent, driving, & working. On this date, pt is limited by back pain that pt reports is now spasms that radiate down LLE. Pt requires extra time for all mobility & PT provided pt with RW for pain management. Pt is able to ambulate to bathroom & back with RW & mod I (pt politely declines going into hallway). Will continue to follow pt acutely to address balance, gait with LRAD & stair negotiation.       Recommendations for follow up therapy are one component of a multi-disciplinary discharge planning process, led by the attending physician.  Recommendations may be updated based on patient status, additional functional criteria and insurance authorization.  Follow Up Recommendations       Assistance Recommended at Discharge PRN  Patient can return home with the following  Help with stairs or ramp for entrance;Assistance with cooking/housework    Equipment Recommendations Rolling walker (2 wheels)  Recommendations for Other Services       Functional Status Assessment Patient has had a recent decline in their functional status and demonstrates the ability to make significant improvements in function in a reasonable and predictable amount of time.     Precautions / Restrictions Precautions Precaution Comments: back for comfort Restrictions Weight Bearing  Restrictions: No      Mobility  Bed Mobility Overal bed mobility: Modified Independent             General bed mobility comments: PT educates pt on log rolling to decrease discomfort of supine>sit but pt still performs bed mobility with her own method. Uses bed rails & extra time 2/2 pain.    Transfers Overall transfer level: Needs assistance Equipment used: None Transfers: Sit to/from Stand Sit to Stand: Supervision, Modified independent (Device/Increase time)           General transfer comment: supervision fade to mod I, STS from slightly elevated EOB with RW & education re: hand placement, STS from low toilet with RW & grab bar PRN with mod I    Ambulation/Gait Ambulation/Gait assistance: Modified independent (Device/Increase time) Gait Distance (Feet): 10 Feet (+ 10 ft) Assistive device: Rolling walker (2 wheels) Gait Pattern/deviations: Decreased step length - left, Decreased step length - right, Decreased stride length Gait velocity: decreased     General Gait Details: Provided pt with RW for pain management. Pt ambulates bed<>bathroom with RW & mod I with very slow stepping/gait speed.  Stairs            Wheelchair Mobility    Modified Rankin (Stroke Patients Only)       Balance Overall balance assessment: Needs assistance Sitting-balance support: Feet supported Sitting balance-Leahy Scale: Good     Standing balance support: Bilateral upper extremity supported, During functional activity Standing balance-Leahy Scale: Good  Pertinent Vitals/Pain Pain Assessment Pain Assessment: Faces Faces Pain Scale: Hurts whole lot Pain Location: L side of back, down to leg Pain Descriptors / Indicators: Spasm Pain Intervention(s): Monitored during session    Home Living Family/patient expects to be discharged to:: Private residence Living Arrangements: Alone   Type of Home: House Home Access: Stairs to  enter Entrance Stairs-Rails: Right Entrance Stairs-Number of Steps: 5   Home Layout: One level Home Equipment: None      Prior Function Prior Level of Function : Independent/Modified Independent;Working/employed;Driving             Mobility Comments: works at [employer], independent without AD       Higher education careers adviser        Extremity/Trunk Assessment   Upper Extremity Assessment Upper Extremity Assessment: Overall WFL for tasks assessed    Lower Extremity Assessment Lower Extremity Assessment: Overall WFL for tasks assessed       Communication   Communication: No difficulties  Cognition Arousal/Alertness: Awake/alert Behavior During Therapy: WFL for tasks assessed/performed Overall Cognitive Status: Within Functional Limits for tasks assessed                                          General Comments General comments (skin integrity, edema, etc.): Pt with continent void on toilet, performs peri hygiene without assistance. PT assists pt with changing into a clean gown.    Exercises     Assessment/Plan    PT Assessment Patient needs continued PT services  PT Problem List Pain;Decreased mobility;Decreased activity tolerance;Decreased balance       PT Treatment Interventions Functional mobility training;DME instruction;Balance training;Patient/family education;Therapeutic activities;Gait training;Neuromuscular re-education;Stair training;Therapeutic exercise    PT Goals (Current goals can be found in the Care Plan section)  Acute Rehab PT Goals Patient Stated Goal: decreased pain PT Goal Formulation: With patient Time For Goal Achievement: 08/29/22 Potential to Achieve Goals: Good    Frequency Min 3X/week     Co-evaluation               AM-PAC PT "6 Clicks" Mobility  Outcome Measure Help needed turning from your back to your side while in a flat bed without using bedrails?: None Help needed moving from lying on your back to sitting  on the side of a flat bed without using bedrails?: A Little Help needed moving to and from a bed to a chair (including a wheelchair)?: None Help needed standing up from a chair using your arms (e.g., wheelchair or bedside chair)?: None Help needed to walk in hospital room?: None Help needed climbing 3-5 steps with a railing? : A Little 6 Click Score: 22    End of Session   Activity Tolerance: Patient limited by pain Patient left:  (sitting EOB with call bell in reach) Nurse Communication: Mobility status PT Visit Diagnosis: Difficulty in walking, not elsewhere classified (R26.2);Pain Pain - part of body:  (back, L leg)    Time: 1610-9604 PT Time Calculation (min) (ACUTE ONLY): 20 min   Charges:   PT Evaluation $PT Eval Low Complexity: 1 Low          Aleda Grana, PT, DPT 08/15/22, 10:30 AM   Sandi Mariscal 08/15/2022, 10:29 AM

## 2022-08-15 NOTE — Evaluation (Signed)
Occupational Therapy Evaluation Patient Details Name: Denise Hudson MRN: [id number] DOB: 05-15-1960 Today's Date: 08/15/2022   History of Present Illness Pt is a 62 y/o F admitted on 08/09/22. Pt with recent hospitalization for pyelonephritis & obstructive uropathy s/p stent placement followed by removal 2 weeks ago. Pt then presented to ED with c/o LBP & found to have osteomyelitis/discitis & 5 mm pleural-based nodule in LLL. Pt admitted on antibiotics, ID was consulted, & IR aspirated disc space on 08/12/22. PMH: HTN, kidney stone   Clinical Impression   PTA patient reports living alone and managing ADLs independently.  Pt drives and works at [employer], limited by pain.   Admitted for above and presents with problem list below.  She was educated on back precautions for comfort, but declines to utilize for bed mobility due to increased pain.  Easily frustrated due to pain.  Completing bed mobility with supervision, simulated ADLs sitting EOB with supervision due declines OOB at this time.  Provided heat pack for pain mgmt support, RN aware.  Anticipate she will progress well as pain improves.  Will follow acutely, but no further OT needs required after dc home.       Recommendations for follow up therapy are one component of a multi-disciplinary discharge planning process, led by the attending physician.  Recommendations may be updated based on patient status, additional functional criteria and insurance authorization.   Assistance Recommended at Discharge Intermittent Supervision/Assistance  Patient can return home with the following A little help with bathing/dressing/bathroom;Assistance with cooking/housework    Functional Status Assessment  Patient has had a recent decline in their functional status and demonstrates the ability to make significant improvements in function in a reasonable and predictable amount of time.  Equipment Recommendations  BSC/3in1    Recommendations for Other Services        Precautions / Restrictions Precautions Precautions: Fall Precaution Comments: back for comfort Restrictions Weight Bearing Restrictions: No      Mobility Bed Mobility Overal bed mobility: Modified Independent             General bed mobility comments: OT educates pt on log rolling to decrease discomfort of supine>sit but pt still performs bed mobility with her own method. Uses bed rails & extra time 2/2 pain.    Transfers                   General transfer comment: pt declined      Balance Overall balance assessment: Needs assistance Sitting-balance support: No upper extremity supported, Feet supported Sitting balance-Leahy Scale: Good                                     ADL either performed or assessed with clinical judgement   ADL Overall ADL's : Needs assistance/impaired     Grooming: Set up;Sitting           Upper Body Dressing : Set up;Sitting   Lower Body Dressing: Supervision/safety;Sitting/lateral leans Lower Body Dressing Details (indicate cue type and reason): pt using figure 4 technique to simulate LB dressing, declines OOB   Toilet Transfer Details (indicate cue type and reason): pt declined         Functional mobility during ADLs: Supervision/safety General ADL Comments: limited to EOB only     Vision   Vision Assessment?: No apparent visual deficits     Perception     Praxis  Pertinent Vitals/Pain Pain Assessment Pain Assessment: Faces Faces Pain Scale: Hurts whole lot Pain Location: L side of back, down to leg Pain Descriptors / Indicators: Spasm Pain Intervention(s): Limited activity within patient's tolerance, Monitored during session, Repositioned, Heat applied     Hand Dominance Right   Extremity/Trunk Assessment Upper Extremity Assessment Upper Extremity Assessment: Overall WFL for tasks assessed   Lower Extremity Assessment Lower Extremity Assessment: Defer to PT evaluation    Cervical / Trunk Assessment Cervical / Trunk Assessment: Other exceptions Cervical / Trunk Exceptions: back pain- osteomyelitis   Communication Communication Communication: No difficulties   Cognition Arousal/Alertness: Awake/alert Behavior During Therapy: WFL for tasks assessed/performed Overall Cognitive Status: Within Functional Limits for tasks assessed                                 General Comments: frustrated due to pain     General Comments  Pt with continent void on toilet, performs peri hygiene without assistance. PT assists pt with changing into a clean gown.    Exercises     Shoulder Instructions      Home Living Family/patient expects to be discharged to:: Private residence Living Arrangements: Alone   Type of Home: House Home Access: Stairs to enter Secretary/administrator of Steps: 5 Entrance Stairs-Rails: Right Home Layout: One level     Bathroom Shower/Tub: Chief Strategy Officer: Standard     Home Equipment: None          Prior Functioning/Environment Prior Level of Function : Independent/Modified Independent;Working/employed;Driving             Mobility Comments: no AD ADLs Comments: works at [employer] (on the floor), drives        OT Problem List: Decreased strength;Decreased activity tolerance;Impaired balance (sitting and/or standing);Pain;Decreased knowledge of use of DME or AE      OT Treatment/Interventions: Self-care/ADL training;Therapeutic exercise;DME and/or AE instruction;Therapeutic activities;Patient/family education;Balance training    OT Goals(Current goals can be found in the care plan section) Acute Rehab OT Goals Patient Stated Goal: home, less pain OT Goal Formulation: With patient Time For Goal Achievement: 08/29/22 Potential to Achieve Goals: Fair  OT Frequency: Min 2X/week    Co-evaluation              AM-PAC OT "6 Clicks" Daily Activity     Outcome Measure Help from another  person eating meals?: None Help from another person taking care of personal grooming?: A Little Help from another person toileting, which includes using toliet, bedpan, or urinal?: A Little Help from another person bathing (including washing, rinsing, drying)?: A Little Help from another person to put on and taking off regular upper body clothing?: A Little Help from another person to put on and taking off regular lower body clothing?: A Little 6 Click Score: 19   End of Session Nurse Communication: Mobility status;Other (comment) (pain and heat pack provided)  Activity Tolerance: Patient limited by pain Patient left: in bed;with call bell/phone within reach;with family/visitor present  OT Visit Diagnosis: Other abnormalities of gait and mobility (R26.89);Pain Pain - Right/Left: Left Pain - part of body:  (back)                Time: 4540-9811 OT Time Calculation (min): 12 min Charges:  OT General Charges $OT Visit: 1 Visit OT Evaluation $OT Eval Low Complexity: 1 Low  Barry Brunner, OT Acute Rehabilitation Automatic [employer] [phone]  Chancy Milroy 08/15/2022, 1:28 PM

## 2022-08-15 NOTE — Progress Notes (Signed)
PHARMACY CONSULT NOTE FOR:  OUTPATIENT  PARENTERAL ANTIBIOTIC THERAPY (OPAT)  Indication: E.coli discitis/osteo Regimen: Rocephin 2g IV every 24 hours End date: 09/23/22 (6 weeks from 08/12/22)  IV antibiotic discharge orders are pended. To discharging provider:  please sign these orders via discharge navigator,  Select New Orders & click on the button choice - Manage This Unsigned Work.     Thank you for allowing pharmacy to be a part of this patient's care.  Georgina Pillion, PharmD, BCPS Infectious Diseases Clinical Pharmacist 08/15/2022 8:30 AM   **Pharmacist phone directory can now be found on [email or web address] ([email or web address]).  Listed under [facility].

## 2022-08-15 NOTE — TOC Progression Note (Signed)
Transition of Care [facility]) - Progression Note    Patient Details  Name: Denise Hudson MRN: [id number] Date of Birth: 07-09-1960  Transition of Care [facility]) CM/SW Contact  Kermit Balo, RN Phone Number: 08/15/2022, 2:21 PM  Clinical Narrative:    Working on pain management for the patient. Plan is for her to d/c home on Monday with IV abx. [name] with [facility] worked with her on the IV Abx and she did well. [name] has arranged HHRN with [facility].  OPAT orders signed and [name] with [employer] aware.  TOC following.    Expected Discharge Plan: Home w Home Health Services Barriers to Discharge: Continued Medical Work up  Expected Discharge Plan and Services   Discharge Planning Services: CM Consult   Living arrangements for the past 2 months: Single Family Home                           HH Arranged: RN HH Agency:  Human resources officer) Date HH Agency Contacted: 08/15/22   Representative spoke with at [facility] Agency: Elita Quick with [facility] has arranged Restaurant manager, fast food   Social Determinants of Health (SDOH) Interventions SDOH Screenings   Food Insecurity: No Food Insecurity (08/10/2022)  Housing: Low Risk  (08/10/2022)  Transportation Needs: No Transportation Needs (08/10/2022)  Utilities: Not At Risk (08/10/2022)  Tobacco Use: Medium Risk (08/12/2022)    Readmission Risk Interventions     No data to display

## 2022-08-15 NOTE — Progress Notes (Signed)
  Progress Note   Patient: Denise Hudson:[id number] DOB: Feb 09, 1961 DOA: 08/09/2022     5 DOS: the patient was seen and examined on 08/15/2022 at 8:45AM      Brief hospital course: Mrs. Wettstein is a 62 y.o. F with HTN, kidney stone recent hospitalization for pyelonephritis and obstructive uropathy s/p stent placement followed by removal 2 weeks ago presenting with worsening low back pain found to have osteomyelitis/discitis and 5 mm pleural-based nodule in LLL.      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    - Continue Rocephin day 3 of 42, EOT 5/28 - Place PICC - Weekly CBC/D, BMP, ESR, CRP while on antibiotics - OPAT in place - ID follow up     Ureterolithiasis Nonobstructive - Follow up with urology    Protein-calorie malnutrition, severe As evidenced by severe fat depletion, severe muscle depletion and reduced energy intake for over a month, weight loss 16% in last 6 weeks and BMI 16  Benign essential HTN BP improved - Continue new amlodipine and HCTZ      Iron deficiency anemia IV iron deferred given bacteremia/discitis.  Discussed with ID. Anemia asymptomatic at present. Hgb up to 7.8 today, oral iron started 4/18  - Needs outpatient GI referral - Continue oral iron - Trend Hgb - Transfusion threshold 7 g/dL            Subjective: No fever, confusion.  She has continued pain in her low back, radiating into the leg.  No leg weakness or loss of bowel or bladder function.     Physical Exam: BP (!) 141/71 (BP Location: Right Arm)   Pulse 70   Temp 98.2 F (36.8 C) (Oral)   Resp 18   Ht 6' (1.829 m)   Wt 54.7 kg   LMP  09/23/2015   SpO2 99%   BMI 16.36 kg/m   , Lying in bed, eating breakfast RRR, no murmurs, no peripheral edema Respiratory rate normal, lungs clear without rales or wheezes Abdomen soft without tenderness palpation or guarding, no ascites or distention Lower extremity sensation intact, leg strength testing limited by pain, but appears to have 5/5 strength    Data Reviewed: Discussed with infectious disease Basic metabolic panel unremarkable Hemoglobin slightly up to 7.8, white blood cell count normal  Family Communication:     Disposition: Status is: Inpatient Place PICC today and then once arrangements are in place for OPAT, she would be medically ready for discharge to home or SNF, pending PT eval           Author: Alberteen Sam, MD 08/15/2022 1:08 PM  For on call review [email or web address].

## 2022-08-16 DIAGNOSIS — M4644 Discitis, unspecified, thoracic region: Secondary | ICD-10-CM | POA: Diagnosis not present

## 2022-08-16 DIAGNOSIS — M4647 Discitis, unspecified, lumbosacral region: Secondary | ICD-10-CM | POA: Diagnosis not present

## 2022-08-16 DIAGNOSIS — N12 Tubulo-interstitial nephritis, not specified as acute or chronic: Secondary | ICD-10-CM | POA: Diagnosis not present

## 2022-08-16 DIAGNOSIS — R7881 Bacteremia: Secondary | ICD-10-CM | POA: Diagnosis not present

## 2022-08-16 LAB — CULTURE, BLOOD (ROUTINE X 2)

## 2022-08-16 MED ORDER — SODIUM CHLORIDE 0.9% FLUSH: 10.0000 mL | INTRAVENOUS | Status: DC | PRN

## 2022-08-16 MED ORDER — CHLORHEXIDINE GLUCONATE CLOTH 2 % EX PADS
6.0000 | MEDICATED_PAD | Freq: Every day | CUTANEOUS | Status: DC
  Administered 2022-08-17 – 2022-08-18 (×2): 6 via TOPICAL

## 2022-08-16 MED ORDER — BISACODYL 10 MG RE SUPP
10.0000 mg | Freq: Every day | RECTAL | Status: DC | PRN
  Administered 2022-08-17: 10 mg via RECTAL
  Filled 2022-08-16: qty 1

## 2022-08-16 NOTE — Progress Notes (Signed)
Peripherally Inserted Central Catheter Placement  The IV Nurse has discussed with the patient and/or persons authorized to consent for the patient, the purpose of this procedure and the potential benefits and risks involved with this procedure.  The benefits include less needle sticks, lab draws from the catheter, and the patient may be discharged home with the catheter. Risks include, but not limited to, infection, bleeding, blood clot (thrombus formation), and puncture of an artery; nerve damage and irregular heartbeat and possibility to perform a PICC exchange if needed/ordered by physician.  Alternatives to this procedure were also discussed.  Bard Power PICC patient education guide, fact sheet on infection prevention and patient information card has been provided to patient /or left at bedside.    PICC Placement Documentation  PICC Single Lumen 08/16/22 Right Brachial 38 cm 0 cm (Active)  Indication for Insertion or Continuance of Line Home intravenous therapies (PICC only) 08/16/22 1601  Exposed Catheter (cm) 0 cm 08/16/22 1601  Site Assessment Clean, Dry, Intact 08/16/22 1601  Line Status Flushed;Saline locked;Blood return noted 08/16/22 1601  Dressing Type Transparent;Securing device 08/16/22 1601  Dressing Status Antimicrobial disc in place;Clean, Dry, Intact 08/16/22 1601  Safety Lock Not Applicable 08/16/22 1601  Line Care Tubing changed;Connections checked and tightened 08/16/22 1601  Line Adjustment (NICU/IV Team Only) No 08/16/22 1601  Dressing Intervention New dressing 08/16/22 1601  Dressing Change Due 08/23/22 08/16/22 1601       Elliot Dally 08/16/2022, 4:02 PM

## 2022-08-16 NOTE — Progress Notes (Signed)
  Progress Note   Patient: Denise Hudson ZOX:[id number] DOB: [birth year] DOA: 08/09/2022     6 DOS: the patient was seen and examined on 08/16/2022 at 8:45AM      Brief hospital course: Denise Hudson is a 62 y.o. F with HTN, kidney stone recent hospitalization for pyelonephritis and obstructive uropathy s/p stent placement followed by removal 2 weeks ago presenting with worsening low back pain found to have osteomyelitis/discitis      Assessment and Plan: * E coli bacteremia L5-S1 discitis/osteomyelitis and epidural phlegmon Likely T12 discitis Pyelonephritis resolved - Continue rocephin      Benign essential HTN - Continue new amlodipine and HCTZ      Iron deficiency anemia - Continue oral iron            Subjective: no change     Physical Exam: BP (!) 157/68 (BP Location: Left Arm)   Pulse 65   Temp 98.3 F (36.8 C) (Oral)   Resp 17   Ht 6' (1.829 m)   Wt 54.7 kg   LMP 09/23/2015   SpO2 100%   BMI 16.36 kg/m   , Lying in bed, watching tv RRR, no murmurs, no peripheral edema Respiratory rate normal, lungs clear without rales or wheezes Abdomen soft without tenderness palpation or guarding, no ascites or distention     Data Reviewed: No new  Family Communication:     Disposition: Status is: Inpatient Awaiting arrngment of home antibiotics, then can safely d.c           Author: Alberteen Sam, MD 08/16/2022 5:31 PM  For on call review [email or web address].

## 2022-08-17 DIAGNOSIS — M4647 Discitis, unspecified, lumbosacral region: Secondary | ICD-10-CM | POA: Diagnosis not present

## 2022-08-17 DIAGNOSIS — R7881 Bacteremia: Secondary | ICD-10-CM | POA: Diagnosis not present

## 2022-08-17 DIAGNOSIS — N201 Calculus of ureter: Secondary | ICD-10-CM | POA: Diagnosis not present

## 2022-08-17 DIAGNOSIS — M4644 Discitis, unspecified, thoracic region: Secondary | ICD-10-CM | POA: Diagnosis not present

## 2022-08-17 LAB — BASIC METABOLIC PANEL
Anion gap: 13 (ref 5–15)
BUN: 20 mg/dL (ref 6–20)
CO2: 26 mmol/L (ref 22–32)
Calcium: 9.3 mg/dL (ref 8.9–10.3)
Chloride: 96 mmol/L — ABNORMAL LOW (ref 98–111)
Creatinine, Ser: 0.77 mg/dL (ref 0.44–1.00)
GFR, Estimated: >60 mL/min (ref >60)
Glucose, Bld: 109 mg/dL — ABNORMAL HIGH (ref 70–99)
Potassium: 4 mmol/L (ref 3.5–5.1)
Sodium: 135 mmol/L (ref 135–145)

## 2022-08-17 LAB — CBC
HCT: 25.7 % — ABNORMAL LOW (ref 36.0–46.0)
Hemoglobin: 7.7 g/dL — ABNORMAL LOW (ref 12.0–15.0)
MCH: 22.3 pg — ABNORMAL LOW (ref 26.0–34.0)
MCHC: 30 g/dL (ref 30.0–36.0)
MCV: 74.3 fL — ABNORMAL LOW (ref 80.0–100.0)
Platelets: 387 10*3/uL (ref 150–400)
RBC: 3.46 MIL/uL — ABNORMAL LOW (ref 3.87–5.11)
RDW: 16.5 % — ABNORMAL HIGH (ref 11.5–15.5)
WBC: 9.1 10*3/uL (ref 4.0–10.5)
nRBC: 0 % (ref 0.0–0.2)

## 2022-08-17 LAB — CULTURE, BLOOD (ROUTINE X 2)
Culture: NO GROWTH
Special Requests: ADEQUATE

## 2022-08-17 LAB — AEROBIC/ANAEROBIC CULTURE W GRAM STAIN (SURGICAL/DEEP WOUND): Gram Stain: NONE SEEN

## 2022-08-17 MED ORDER — SENNA 8.6 MG PO TABS
1.0000 | ORAL_TABLET | Freq: Once | ORAL | Status: AC
  Administered 2022-08-17: 8.6 mg via ORAL
  Filled 2022-08-17: qty 1

## 2022-08-17 MED ORDER — POLYETHYLENE GLYCOL 3350 17 G PO PACK
17.0000 g | PACK | Freq: Three times a day (TID) | ORAL | Status: DC | PRN
  Administered 2022-08-17: 17 g via ORAL
  Filled 2022-08-17: qty 1

## 2022-08-17 NOTE — Progress Notes (Signed)
  Progress Note   Patient: Denise Hudson ZOX:[id number] DOB: 11-04-1960 DOA: 08/09/2022     7 DOS: the patient was seen and examined on 08/17/2022 at 10:32AM      Brief hospital course: Denise Hudson is a 62 y.o. F with HTN, kidney stone recent hospitalization for pyelonephritis and obstructive uropathy s/p stent placement followed by removal 2 weeks ago presenting with worsening low back pain found to have osteomyelitis/discitis      Assessment and Plan: * E coli bacteremia L5-S1 discitis/osteomyelitis and epidural phlegmon Likely T12 discitis Pyelonephritis resolved - Continue Rocephin      Benign essential HTN - Ctoninue new amlodipine and HCTZ      Iron deficiency anemia - Continue oral iron            Subjective: constipated     Physical Exam: BP (!) 146/63 (BP Location: Left Arm)   Pulse 66   Temp 98.6 F (37 C) (Oral)   Resp 18   Ht 6' (1.829 m)   Wt 54.7 kg   LMP 09/23/2015   SpO2 100%   BMI 16.36 kg/m   Adult female, lying in bed, interactive and appropriate RRR, no murmurs, no peripheral edema Attention normal, affect blunted, judgment insight appear normal    Data Reviewed: CBC shows stable hemoglobin, no leukocytosis  Family Communication:     Disposition: Status is: Inpatient Awaiting arrngment of home antibiotics, then can safely d.c, likely tomorrow morning           Author: Alberteen Sam, MD 08/17/2022 4:11 PM  For on call review [email or web address].

## 2022-08-18 ENCOUNTER — Other Ambulatory Visit (HOSPITAL_COMMUNITY): Payer: Self-pay

## 2022-08-18 DIAGNOSIS — M4647 Discitis, unspecified, lumbosacral region: Secondary | ICD-10-CM | POA: Diagnosis not present

## 2022-08-18 DIAGNOSIS — M4644 Discitis, unspecified, thoracic region: Secondary | ICD-10-CM | POA: Diagnosis not present

## 2022-08-18 DIAGNOSIS — N201 Calculus of ureter: Secondary | ICD-10-CM | POA: Diagnosis not present

## 2022-08-18 DIAGNOSIS — R7881 Bacteremia: Secondary | ICD-10-CM | POA: Diagnosis not present

## 2022-08-18 MED ORDER — AMLODIPINE BESYLATE 5 MG PO TABS
5.0000 mg | ORAL_TABLET | Freq: Every day | ORAL | 3 refills | Status: DC
  Filled 2022-08-18 – 2022-09-19 (×3): qty 30, 30d supply, fill #0
  Filled 2022-11-06: qty 30, 30d supply, fill #1
  Filled 2022-12-12: qty 30, 30d supply, fill #2

## 2022-08-18 MED ORDER — METHOCARBAMOL 500 MG PO TABS
500.0000 mg | ORAL_TABLET | Freq: Three times a day (TID) | ORAL | 0 refills | Status: DC | PRN
  Filled 2022-08-18: qty 15, 5d supply, fill #0

## 2022-08-18 MED ORDER — POLYETHYLENE GLYCOL 3350 17 GM/SCOOP PO POWD
17.0000 g | Freq: Three times a day (TID) | ORAL | 0 refills | Status: DC | PRN
  Filled 2022-08-18: qty 238, 5d supply, fill #0

## 2022-08-18 MED ORDER — HYDROCHLOROTHIAZIDE 12.5 MG PO TABS
12.5000 mg | ORAL_TABLET | Freq: Every day | ORAL | 3 refills | Status: DC
  Filled 2022-08-18 – 2022-09-19 (×3): qty 30, 30d supply, fill #0
  Filled 2022-11-06: qty 30, 30d supply, fill #1
  Filled 2022-12-12: qty 30, 30d supply, fill #2

## 2022-08-18 MED ORDER — SENNOSIDES-DOCUSATE SODIUM 8.6-50 MG PO TABS: 2.0000 | ORAL_TABLET | Freq: Two times a day (BID) | ORAL | Status: DC | PRN

## 2022-08-18 MED ORDER — OXYCODONE-ACETAMINOPHEN 7.5-325 MG PO TABS
1.0000 | ORAL_TABLET | ORAL | 0 refills | Status: DC | PRN
  Filled 2022-08-18: qty 20, 7d supply, fill #0

## 2022-08-18 MED ORDER — ENSURE ENLIVE PO LIQD: 237.0000 mL | Freq: Three times a day (TID) | ORAL | 12 refills | Status: DC

## 2022-08-18 MED ORDER — FERROUS SULFATE 325 (65 FE) MG PO TABS
325.0000 mg | ORAL_TABLET | ORAL | 3 refills | Status: DC
  Filled 2022-08-18 – 2022-12-12 (×3): qty 15, 30d supply, fill #0

## 2022-08-18 NOTE — Progress Notes (Signed)
Physical Therapy Treatment Patient Details Name: Denise Hudson MRN: [id number] DOB: [birth year] Today's Date: 08/18/2022   History of Present Illness Pt is a 62 y/o F admitted on 08/09/22. Pt with recent hospitalization for pyelonephritis & obstructive uropathy s/p stent placement followed by removal 2 weeks ago. Pt then presented to ED with c/o LBP & found to have osteomyelitis/discitis & 5 mm pleural-based nodule in LLL. Pt admitted on antibiotics, ID was consulted, & IR aspirated disc space on 08/12/22. PMH: HTN, kidney stone    PT Comments    Pt reports frustration with care and feeling that her pain is not well managed or controlled. PT provided active listening and offered to attempt gentle stretching and MET in supine position to address spasms, however, pt refusing to let therapist physically touch her, stating it would not help. Pt ambulating room distances with a walker without physical assist. Reports walker is helpful for pain management. Declined practicing stairs or hallway ambulation.     Recommendations for follow up therapy are one component of a multi-disciplinary discharge planning process, led by the attending physician.  Recommendations may be updated based on patient status, additional functional criteria and insurance authorization.  Follow Up Recommendations       Assistance Recommended at Discharge PRN  Patient can return home with the following Help with stairs or ramp for entrance;Assistance with cooking/housework   Equipment Recommendations  Rolling walker (2 wheels)    Recommendations for Other Services       Precautions / Restrictions Precautions Precautions: Fall Restrictions Weight Bearing Restrictions: No     Mobility  Bed Mobility Overal bed mobility: Modified Independent                  Transfers Overall transfer level: Modified independent Equipment used: Rolling walker (2 wheels)                     Ambulation/Gait Ambulation/Gait assistance: Modified independent (Device/Increase time) Gait Distance (Feet): 10 Feet Assistive device: Rolling walker (2 wheels) Gait Pattern/deviations: Step-through pattern, Decreased stride length       General Gait Details: Ambulates room distances with RW, demonstrates good posture and slow and steady pace. no gross imbalance noted   Stairs             Wheelchair Mobility    Modified Rankin (Stroke Patients Only)       Balance Overall balance assessment: Needs assistance Sitting-balance support: No upper extremity supported, Feet supported Sitting balance-Leahy Scale: Good     Standing balance support: Bilateral upper extremity supported Standing balance-Leahy Scale: Fair                              Cognition Arousal/Alertness: Awake/alert Behavior During Therapy: WFL for tasks assessed/performed Overall Cognitive Status: Within Functional Limits for tasks assessed                                 General Comments: frustrated due to pain        Exercises      General Comments        Pertinent Vitals/Pain Pain Assessment Pain Assessment: Faces Faces Pain Scale: Hurts even more Pain Location: back, with radicular symptoms to LLE Pain Descriptors / Indicators: Spasm Pain Intervention(s): Limited activity within patient's tolerance, Monitored during session    Home Living  Prior Function            PT Goals (current goals can now be found in the care plan section) Acute Rehab PT Goals Patient Stated Goal: decreased pain Potential to Achieve Goals: Good    Frequency    Min 3X/week      PT Plan Current plan remains appropriate    Co-evaluation              AM-PAC PT "6 Clicks" Mobility   Outcome Measure  Help needed turning from your back to your side while in a flat bed without using bedrails?: None Help needed moving from  lying on your back to sitting on the side of a flat bed without using bedrails?: None Help needed moving to and from a bed to a chair (including a wheelchair)?: None Help needed standing up from a chair using your arms (e.g., wheelchair or bedside chair)?: None Help needed to walk in hospital room?: None Help needed climbing 3-5 steps with a railing? : A Little 6 Click Score: 23    End of Session   Activity Tolerance: Patient limited by pain Patient left: in bed;with call bell/phone within reach   PT Visit Diagnosis: Difficulty in walking, not elsewhere classified (R26.2);Pain     Time: 0903-0920 PT Time Calculation (min) (ACUTE ONLY): 17 min  Charges:  $Therapeutic Activity: 8-22 mins                     Lillia Pauls, PT, DPT Acute Rehabilitation Services Office [phone]    Norval Morton 08/18/2022, 10:10 AM

## 2022-08-18 NOTE — Progress Notes (Signed)
OT Cancellation Note  Patient Details Name: Denise Hudson MRN: [id number] DOB: 04-05-1961   Cancelled Treatment:    Reason Eval/Treat Not Completed: Patient declined, no reason specified- pt declined, reports just finishing with PT and now with spasms flared up.  RN notified of pain.  OT will follow and see as able.   Barry Brunner, OT Acute Rehabilitation Services Office [phone]   Chancy Milroy 08/18/2022, 9:27 AM

## 2022-08-18 NOTE — Discharge Summary (Signed)
Physician Discharge Summary   Patient: Denise Hudson MRN: [id number] DOB: 02/15/1961  Admit date:     08/09/2022  Discharge date: 08/18/22  Discharge Physician: Alberteen Sam   PCP: Pcp, No     Recommendations at discharge:  Follow up with new PCP on Weds 4/24 Follow up with Gastroenterology for work up of iron deficiency anemia and weight loss on 5/10 Follow up with Infectious Disease on 5/14 for discitis and vertebral osteomyelitis Follow up with Urology as previously planned Dr. Welton Flakes: Please check Hgb in 1 week and iron studies in 4-6 weeks, arrange IV iron as needed Please repeat CT chest in 6-12 months in this former smoker with 5mm lung nodule Check BP and adjust new antihypertensives as appropriate      Discharge Diagnoses: Principal Problem:   Discitis of lumbosacral region Active Problems:   E coli bacteremia   Discitis of thoracic region   Ureterolithiasis   Iron deficiency anemia   Hypokalemia   Benign essential HTN   Protein-calorie malnutrition, severe   Lung nodule   Hyponatremia      Hospital Course: Denise Hudson is a 62 y.o. F with HTN, kidney stone recent hospitalization for pyelonephritis and obstructive uropathy s/p stent placement followed by removal 2 weeks ago presenting with worsening low back pain found to have osteomyelitis/discitis and 5 mm pleural-based nodule in LLL.        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    Blood cultures subsequently grew E coli.  ID and IR consulted, started on antibiotics, IR aspirated the disk space 4/16 which also grew E coli.    PICC was placed, and home IV antibiotics were arranged.  Discharged on daily Rocephin, EOT 5/28  Needs weekly CBC/D, BMP, ESR, CRP while on antibiotics, leave PICC at completion  of antibiotics.    Will follow up with ID 5/14 for decision regarding repeat imaging.       Lung nodule Former 20 year smoker.  5mm nodule seen incidentally.  Please repeat CT chest in 6-12 months.    Benign essential HTN Started on new amlodipine and HCTZ, needs titration.  Iron deficiency anemia Iron studies imply deficiency.  IV iron deferred given bacteremia/discitis.  Oral iron started prior to discharge.  Given weight loss and iron deficiency,and no prior colonoscopy, GI follow up was arranged for possible EGD/colonoscopy.        Ureterolithiasis Nonobstructive small stones seen bilaterally on imaging.  Asymptomatic. - Keep planned urology follow up  Protein-calorie malnutrition, severe As evidenced by severe fat depletion, severe muscle depletion and reduced energy intake for over a month, weight loss 16% in last 6 weeks and BMI 16              The [place] Controlled Substances Registry was reviewed for this patient prior to discharge.  Consultants: Infectious Disease, IR Procedures performed: Aspiration of disc space, MRI lumbar spine  Disposition: Home health Diet recommendation:  Regular diet  DISCHARGE MEDICATION: Allergies as of 08/18/2022   No Known Allergies      Medication List     STOP taking these medications    oxybutynin 5 MG tablet Commonly known as: DITROPAN       TAKE these medications    amLODipine 5 MG tablet Commonly known as: NORVASC Take 1 tablet (5 mg total) by mouth daily.  cefTRIAXone  IVPB Commonly known as: ROCEPHIN Inject 2 g into the vein daily. Indication:  E.coli discitis/ostoe First Dose: Yes Last Day of Therapy:  09/23/22 Labs - Once weekly:  CBC/D and BMP, ESR and CRP Method of administration: IV Push Pull PICC line at the completion of IV antibiotics Method of administration may be changed at the discretion of home infusion pharmacist based upon assessment of the patient and/or caregiver's  ability to self-administer the medication ordered.   feeding supplement Liqd Take 237 mLs by mouth 3 (three) times daily between meals.   ferrous sulfate 325 (65 FE) MG tablet Take 1 tablet (325 mg total) by mouth every other day.   hydrochlorothiazide 12.5 MG tablet Commonly known as: HYDRODIURIL Take 1 tablet (12.5 mg total) by mouth daily.   methocarbamol 500 MG tablet Commonly known as: ROBAXIN Take 1 tablet (500 mg total) by mouth every 8 (eight) hours as needed for muscle spasms.   ondansetron 4 MG tablet Commonly known as: ZOFRAN Take 1 tablet (4 mg total) by mouth every 6 (six) hours as needed for nausea.   oxyCODONE-acetaminophen 7.5-325 MG tablet Commonly known as: PERCOCET Take 1 tablet by mouth every 4 (four) hours as needed for moderate pain.   polyethylene glycol powder 17 GM/SCOOP powder Commonly known as: GLYCOLAX/MIRALAX Take 17 g by mouth 3 (three) times daily as needed for mild constipation.   senna-docusate 8.6-50 MG tablet Commonly known as: Senokot-S Take 2 tablets by mouth 2 (two) times daily as needed for moderate constipation.               Durable Medical Equipment  (From admission, onward)           Start     Ordered   08/15/22 1009  For home use only DME Walker rolling  Once       Question Answer Comment  Walker: With 5 Inch Wheels   Patient needs a walker to treat with the following condition General weakness      08/15/22 1008              Discharge Care Instructions  (From admission, onward)           Start     Ordered   08/15/22 0000  Change dressing on IV access line weekly and PRN  (Home infusion instructions - [facility] )        08/15/22 1410            Follow-up Information     [facility] Follow up on 08/20/2022.   Why: Your appointment is at 8:45 am. Please arrive early and bring a picture ID and your current medications. Contact information: [address] [place] [address] [phone]        Doug Sou D, PA-C Follow up on 09/05/2022.   Specialty: Gastroenterology Why: 1:30 pm Contact information: [address] [place] [address] [phone]         [facility] Follow up.   Why: The home health should call you for the first home visit. Contact information: [phone]        [facility] Follow up.   Why: [phone]                Discharge Instructions     [facility] pharmacist to adjust dose for Vancomycin, Aminoglycosides and other anti-infective therapies as requested by physician.   Complete by: As directed  [facility] to provide Cath Flo    Complete by: As directed    Administer for PICC line occlusion and as ordered by physician for other access device issues.   Anaphylaxis Kit: Provided to treat any anaphylactic reaction to the medication being provided to the patient if First Dose or when requested by physician   Complete by: As directed    Epinephrine /ml vial / amp: Administer 0.3mg  (0.38ml) subcutaneously once for moderate to severe anaphylaxis, nurse to call physician and pharmacy when reaction occurs and call 911 if needed for immediate care   Diphenhydramine /ml IV vial: Administer 25-50mg  IV/IM PRN for first dose reaction, rash, itching, mild reaction, nurse to call physician and pharmacy when reaction occurs   Sodium Chloride 0.9% NS IV: Administer if needed for hypovolemic blood pressure drop or as ordered by physician after call to physician with anaphylactic reaction   Change dressing on IV access line weekly and PRN   Complete by: As directed    Discharge instructions   Complete by: As directed    IMPORTANT: You have a follow up appointmnent with your new primary care doctor (see below in the To Do section) [facility]   You have an appointnment with Doug Sou at [facility] coming up in 3 weeks for GI evaluation for your anemia  You have an appointment with the Infectious Disease clinic Dr. Elinor Parkinson in mid May as well, to monitor your infection treatment and decide about reimaging    IMPORTANT: Your blood pressure is high and you must take amlodipine and HCTZ/hydrochlorothiazide daily   ALSO, your iron is low, you must take ferrous sulfate every other day   Flush IV access with Sodium Chloride 0.9% and Heparin 10 units/ml or 100 units/ml   Complete by: As directed    Home infusion instructions - [facility]   Complete by: As directed    Instructions: Flush IV access with Sodium Chloride 0.9% and Heparin 10units/ml or 100units/ml   Change dressing on IV access line: Weekly and PRN   Instructions Cath Flo : Administer for PICC Line occlusion and as ordered by physician for other access device   [facility] pharmacist to adjust dose for: Vancomycin, Aminoglycosides and other anti-infective therapies as requested by physician   Increase activity slowly   Complete by: As directed    Method of administration may be changed at the discretion of home infusion pharmacist based upon assessment of the patient and/or caregiver's ability to self-administer the medication ordered   Complete by: As directed    No wound care   Complete by: As directed        Discharge Exam: [facility]   08/09/22 2138 08/10/22 1503  Weight: 59 kg 54.7 kg    General: Pt is alert, awake, not in acute distress Cardiovascular: RRR, nl S1-S2, no murmurs appreciated.   No LE edema.   Respiratory: Normal respiratory rate and rhythm.  CTAB without rales or wheezes. Abdominal: Abdomen soft and non-tender.  No distension or HSM.   Neuro/Psych: Strength symmetric in upper and lower extremities.  Judgment and insight appear normal.   Condition at discharge: fair  The results of significant diagnostics from this hospitalization (including imaging,  microbiology, ancillary and laboratory) are listed below for reference.   Imaging Studies: [place] EKG SITE RITE  Result Date: 08/15/2022 If Site Rite image not attached, placement could not be confirmed due to current cardiac rhythm.  IR LUMBAR DISC ASPIRATION W/IMG GUIDE  Result Date: 08/12/2022 INDICATION: 62 year old female with recent history of nephrolithiasis and pyelonephritis related to obstructive uropathy who developed back pain and fever. MRI of the lumbar spine revealed findings suggestive of L5-S1 discitis/osteomyelitis. She presents to our service today for a fluoroscopy guided FNA of the L5-S1 intervertebral disc. EXAM: FLUOROSCOPY GUIDED L5-S1 INTERVERTEBRAL DISC FINE-NEEDLE ASPIRATION MEDICATIONS: The patient is currently admitted to the hospital and receiving intravenous antibiotics. The antibiotics were administered within an appropriate time frame prior to the initiation of the procedure. ANESTHESIA/SEDATION: A total of Versed 1.5 mg and Fentanyl 75 mcg were administered intravenously for moderate conscious sedation monitored under my direct supervision. Total intraservice time of sedation was 21 minute. The patient's vital signs were monitored throughout the procedure and recorded in the patient's medical record by the radiology nurse. FLUOROSCOPY EXPOSURE: Peak skin dose (PSD) 395 mGy. COMPLICATIONS: None immediate. PROCEDURE: Informed written consent was obtained from the patient after a thorough discussion of the procedural risks, benefits and alternatives. All questions were addressed. Maximal Sterile Barrier Technique was utilized including caps, mask, sterile gowns, sterile gloves, sterile drape, hand hygiene and skin antiseptic. A timeout was performed prior to the initiation of the procedure. The patient was placed in prone position on the angiography table. The lumbar spine region was prepped and draped in a sterile fashion. Under fluoroscopy, the L5-S1 disc space was delineated  and the skin area was marked. The skin was infiltrated with a 1% Lidocaine approximately 6 cm lateral to the spinous process projection on the left. Using a 20-gauge spinal needle, the soft issue and the peripedicular space were infiltrated with Bupivacaine 0.5%. Subsequently, the 20 gauge needle was advanced under fluoroscopy guided into the L5-S1 disc space. The mandrel was removed and 2 fine-needle aspiration samples were obtained. Small amount clear fluid was collected in the first sample and blood tinged fluid was collected in the second sample. The needle was subsequently withdrawn. The access sites were cleaned and covered with a sterile bandage. IMPRESSION: Successful fluoroscopy guided L5-S1 disc fine-needle aspiration. Samples collected were sent for Gram stain and culture. Electronically Signed   By: Baldemar Lenis M.D.   On: 08/12/2022 14:13   MR Lumbar Spine W Wo Contrast  Result Date: 08/10/2022 CLINICAL DATA:  Lower back pain with infection suspected. Positive x-ray EXAM: MRI LUMBAR SPINE WITHOUT AND WITH CONTRAST TECHNIQUE: Multiplanar and multiecho pulse sequences of the lumbar spine were obtained without and with intravenous contrast. CONTRAST:  6mL GADAVIST GADOBUTROL 1 MMOL/ML IV SOLN COMPARISON:  Abdominal CT from earlier today FINDINGS: Segmentation:  5 lumbar type vertebrae Alignment: Straightening of lumbar lordosis with slight L3-4 anterolisthesis Vertebrae: Marrow edema and endplate destruction at L5-S1 with paravertebral inflammation. The disc space is nonenhancing, likely purulence. Ventral epidural enhancement without collection. Partially covered corner edema at T12 anteriorly and superiorly. Conus medullaris and cauda equina: Conus extends to the L1 level. Conus and cauda equina appear normal. Paraspinal and other soft tissues: As above Disc levels: Generalized disc desiccation and narrowing with bulging. Limited facet degeneration. Disc bulging and height loss at  L5-S1 causes moderate biforaminal stenosis. IMPRESSION: 1. 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 No spinal canal abscess. 2. Partially covered inflammation at the T12 anterior superior corner, likely additional site of discitis at T11-12 based on preceding CT. Electronically Signed   By: Tiburcio Pea M.D.   On: 08/10/2022 08:42   CT ABDOMEN PELVIS W CONTRAST  Result Date: 08/10/2022 CLINICAL DATA:  Sepsis, right flank pain, left lower quadrant pain, and fever. Recent pyelonephritis with kidney stone. EXAM: CT ABDOMEN AND PELVIS WITH CONTRAST TECHNIQUE: Multidetector CT imaging of the abdomen and pelvis was performed using the standard protocol following bolus administration of intravenous contrast. RADIATION DOSE REDUCTION: This exam was performed according to the departmental dose-optimization program which includes automated exposure control, adjustment of the mA and/or kV according to patient size and/or use of iterative reconstruction technique. CONTRAST:  OMNIPAQUE IOHEXOL 300 MG/ML  SOLN COMPARISON:  07/18/2022. FINDINGS: Lower chest: The heart is enlarged. There is atelectasis at the lung bases. A 5 mm pleural-based nodule is present in the left lower lobe, axial image 1. Hepatobiliary: No focal liver abnormality is seen. No gallstones, gallbladder wall thickening, or biliary dilatation. Pancreas: Unremarkable. No pancreatic ductal dilatation or surrounding inflammatory changes. Spleen: Spleen is normal in size. Scattered subcentimeter hypodensities are present in the spleen, possible cysts or hemangiomas. Adrenals/Urinary Tract: The adrenal glands are within normal limits. The kidneys enhance symmetrically. Nonobstructive renal calculi are noted bilaterally. There is a cyst in the upper pole of the right kidney. No hydroureteronephrosis bilaterally. The bladder is within normal limits. Stomach/Bowel: Stomach is within normal limits. Appendix is  not seen. No evidence of bowel wall thickening, distention, or inflammatory changes. No free air or pneumatosis. Vascular/Lymphatic: Aortic atherosclerosis. A nonspecific enlarged lymph node is present along the iliac chain on the right measuring 1.1 cm. Reproductive: Uterus and bilateral adnexa are unremarkable. Other: No abdominopelvic ascites. Musculoskeletal: Degenerative changes are present in the thoracolumbar spine. There are bony erosions along the endplates at L5-S1 with increased soft tissue density in the region and likely disc herniation. No acute fracture. IMPRESSION: 1. Bilateral nephrolithiasis with no obstructive uropathy bilaterally. 2. New erosions at the inferior endplate at L5 and superior endplate as S1 with with surrounding soft tissue thickening, concerning for osteomyelitis/discitis. MRI with contrast is recommended for further evaluation. 3. 5 mm pleural-based nodule in the left lower lobe. No follow-up needed if patient is low-risk.This recommendation follows the consensus statement: Guidelines for Management of Incidental Pulmonary Nodules Detected on CT Images: From the Fleischner Society 2017; Radiology 2017; 284:228-243. Electronically Signed   By: Thornell Sartorius M.D.   On: 08/10/2022 01:38    Microbiology: Results for orders placed or performed during the hospital encounter of 08/09/22  Urine Culture     Status: None   Collection Time: 08/09/22 11:57 PM   Specimen: Urine, Clean Catch  Result Value Ref Range Status   Specimen Description   Final    URINE, CLEAN CATCH Performed at [facility], [address], [place], [place] [address]    Special Requests   Final    NONE Performed at [facility], [address] [place], [place] [address]    Culture   Final    NO GROWTH Performed at [facility], [address], [place], [place] [address]    Report Status 08/11/2022 FINAL  Final  Culture, blood (routine x 2)     Status:  Abnormal   Collection Time: 08/10/22 12:34 AM   Specimen: BLOOD LEFT FOREARM  Result Value Ref Range Status   Specimen Description   Final    BLOOD LEFT FOREARM Performed at [facility], [address], [place], [place] [address]    Special Requests   Final    BOTTLES DRAWN AEROBIC AND ANAEROBIC Blood Culture adequate volume Performed  at [facility], [address], [place], [place] [address]    Culture  Setup Time   Final    GRAM NEGATIVE RODS ANAEROBIC BOTTLE ONLY CRITICAL VALUE NOTED.  VALUE IS CONSISTENT WITH PREVIOUSLY REPORTED AND CALLED VALUE.    Culture (A)  Final    ESCHERICHIA COLI SUSCEPTIBILITIES PERFORMED ON PREVIOUS CULTURE WITHIN THE LAST 5 DAYS. Performed at [facility], [address] [place], [place] [address]    Report Status 08/13/2022 FINAL  Final  Culture, blood (routine x 2)     Status: Abnormal   Collection Time: 08/10/22  1:31 AM   Specimen: BLOOD  Result Value Ref Range Status   Specimen Description BLOOD RIGHT ANTECUBITAL  Final   Special Requests   Final    BOTTLES DRAWN AEROBIC AND ANAEROBIC Blood Culture adequate volume   Culture  Setup Time   Final    GRAM NEGATIVE RODS IN BOTH AEROBIC AND ANAEROBIC BOTTLES CRITICAL RESULT CALLED TO, READ BACK BY AND VERIFIED WITH: PHARMD Malva Cogan [id number] AT 1738 BY EC Performed at [facility], [address], [place], [place] [address]    Culture ESCHERICHIA COLI (A)  Final   Report Status 08/12/2022 FINAL  Final   Organism ID, Bacteria ESCHERICHIA COLI  Final      Susceptibility   Escherichia coli - MIC*    AMPICILLIN <=2 SENSITIVE Sensitive     CEFEPIME <=0.12 SENSITIVE Sensitive     CEFTAZIDIME <=1 SENSITIVE Sensitive     CEFTRIAXONE <=0.25 SENSITIVE Sensitive     CIPROFLOXACIN <=0.25 SENSITIVE Sensitive     GENTAMICIN <=1 SENSITIVE Sensitive     IMIPENEM <=0.25 SENSITIVE Sensitive     TRIMETH/SULFA <=20 SENSITIVE Sensitive      AMPICILLIN/SULBACTAM <=2 SENSITIVE Sensitive     PIP/TAZO <=4 SENSITIVE Sensitive     * ESCHERICHIA COLI  Blood Culture ID Panel (Reflexed)     Status: Abnormal   Collection Time: 08/10/22  1:31 AM  Result Value Ref Range Status   Enterococcus faecalis NOT DETECTED NOT DETECTED Final   Enterococcus Faecium NOT DETECTED NOT DETECTED Final   Listeria monocytogenes NOT DETECTED NOT DETECTED Final   Staphylococcus species NOT DETECTED NOT DETECTED Final   Staphylococcus aureus (BCID) NOT DETECTED NOT DETECTED Final   Staphylococcus epidermidis NOT DETECTED NOT DETECTED Final   Staphylococcus lugdunensis NOT DETECTED NOT DETECTED Final   Streptococcus species NOT DETECTED NOT DETECTED Final   Streptococcus agalactiae NOT DETECTED NOT DETECTED Final   Streptococcus pneumoniae NOT DETECTED NOT DETECTED Final   Streptococcus pyogenes NOT DETECTED NOT DETECTED Final   A.calcoaceticus-baumannii NOT DETECTED NOT DETECTED Final   Bacteroides fragilis NOT DETECTED NOT DETECTED Final   Enterobacterales DETECTED (A) NOT DETECTED Final    Comment: Enterobacterales represent a large order of gram negative bacteria, not a single organism. CRITICAL RESULT CALLED TO, READ BACK BY AND VERIFIED WITH: PHARMD KIM Daphine Deutscher [id number] AT 1738 BY EC    Enterobacter cloacae complex NOT DETECTED NOT DETECTED Final   Escherichia coli DETECTED (A) NOT DETECTED Final    Comment: CRITICAL RESULT CALLED TO, READ BACK BY AND VERIFIED WITH: PHARMD Malva Cogan [id number] AT 1738 BY EC    Klebsiella aerogenes NOT DETECTED NOT DETECTED Final   Klebsiella oxytoca NOT DETECTED NOT DETECTED Final   Klebsiella pneumoniae NOT DETECTED NOT DETECTED Final   Proteus species NOT DETECTED NOT DETECTED Final   Salmonella species NOT DETECTED NOT DETECTED Final   Serratia  marcescens NOT DETECTED NOT DETECTED Final   Haemophilus influenzae NOT DETECTED NOT DETECTED Final   Neisseria meningitidis NOT DETECTED NOT DETECTED Final    Pseudomonas aeruginosa NOT DETECTED NOT DETECTED Final   Stenotrophomonas maltophilia NOT DETECTED NOT DETECTED Final   Candida albicans NOT DETECTED NOT DETECTED Final   Candida auris NOT DETECTED NOT DETECTED Final   Candida glabrata NOT DETECTED NOT DETECTED Final   Candida krusei NOT DETECTED NOT DETECTED Final   Candida parapsilosis NOT DETECTED NOT DETECTED Final   Candida tropicalis NOT DETECTED NOT DETECTED Final   Cryptococcus neoformans/gattii NOT DETECTED NOT DETECTED Final   CTX-M ESBL NOT DETECTED NOT DETECTED Final   Carbapenem resistance IMP NOT DETECTED NOT DETECTED Final   Carbapenem resistance KPC NOT DETECTED NOT DETECTED Final   Carbapenem resistance NDM NOT DETECTED NOT DETECTED Final   Carbapenem resist OXA 48 LIKE NOT DETECTED NOT DETECTED Final   Carbapenem resistance VIM NOT DETECTED NOT DETECTED Final    Comment: Performed at [facility], [address], [place], [place] [address]  Culture, blood (Routine X 2) w Reflex to ID Panel     Status: None   Collection Time: 08/12/22  5:59 AM   Specimen: BLOOD RIGHT ARM  Result Value Ref Range Status   Specimen Description BLOOD RIGHT ARM  Final   Special Requests   Final    BOTTLES DRAWN AEROBIC AND ANAEROBIC Blood Culture adequate volume   Culture   Final    NO GROWTH 5 DAYS Performed at [facility], [address] [place], [place] [address]    Report Status 08/17/2022 FINAL  Final  Culture, blood (Routine X 2) w Reflex to ID Panel     Status: None   Collection Time: 08/12/22  6:04 AM   Specimen: BLOOD LEFT ARM  Result Value Ref Range Status   Specimen Description BLOOD LEFT ARM  Final   Special Requests   Final    BOTTLES DRAWN AEROBIC AND ANAEROBIC Blood Culture adequate volume   Culture   Final    NO GROWTH 5 DAYS Performed at [facility], [address] [place], [place] [address]    Report Status 08/17/2022 FINAL  Final  Aerobic/Anaerobic Culture w Gram Stain (surgical/deep wound)      Status: None   Collection Time: 08/12/22 11:12 AM   Specimen: PATH Disc; Body Fluid  Result Value Ref Range Status   Specimen Description OTHER  Final   Special Requests  DISC L5 S1  Final   Gram Stain NO WBC SEEN NO ORGANISMS SEEN   Final   Culture   Final    MODERATE ESCHERICHIA COLI NO ANAEROBES ISOLATED Performed at [facility], [address], [place], [place] [address]    Report Status 08/17/2022 FINAL  Final   Organism ID, Bacteria ESCHERICHIA COLI  Final      Susceptibility   Escherichia coli - MIC*    AMPICILLIN <=2 SENSITIVE Sensitive     CEFEPIME <=0.12 SENSITIVE Sensitive     CEFTAZIDIME <=1 SENSITIVE Sensitive     CEFTRIAXONE <=0.25 SENSITIVE Sensitive     CIPROFLOXACIN <=0.25 SENSITIVE Sensitive     GENTAMICIN <=1 SENSITIVE Sensitive     IMIPENEM <=0.25 SENSITIVE Sensitive     TRIMETH/SULFA <=20 SENSITIVE Sensitive     AMPICILLIN/SULBACTAM <=2 SENSITIVE Sensitive     PIP/TAZO <=4 SENSITIVE Sensitive     * MODERATE ESCHERICHIA COLI    Labs: CBC: Recent  Labs  Lab 08/12/22 0558 08/13/22 0414 08/14/22 0313 08/15/22 0447 08/17/22 0542  WBC 7.4 7.9 8.1 8.2 9.1  HGB 7.4* 7.1* 7.0* 7.8* 7.7*  HCT 23.9* 22.8* 22.9* 25.1* 25.7*  MCV 72.9* 73.1* 73.4* 71.5* 74.3*  PLT 346 336 359 412* 387   Basic Metabolic Panel: Recent Labs  Lab 08/12/22 0558 08/13/22 0414 08/15/22 0447 08/17/22 0542  NA 137 138 135 135  K 3.8 3.5 4.0 4.0  CL 101 103 99 96*  CO2 25 24 25 26   GLUCOSE 109* 144* 100* 109*  BUN 21* 20 14 20   CREATININE 0.74 0.76 0.70 0.77  CALCIUM 8.8* 8.5* 8.9 9.3  MG 1.8 1.9  --   --   PHOS 3.7 3.9  --   --    Liver Function Tests: Recent Labs  Lab 08/12/22 0558 08/13/22 0414  ALBUMIN 1.9* 1.8*   CBG: No results for input(s): "GLUCAP" in the last 168 hours.  Discharge time spent: approximately 35 minutes spent on discharge counseling, evaluation of patient on day of discharge, and coordination of discharge planning with nursing,  social work, pharmacy and case management  Signed: Alberteen Sam, MD [facility] 08/18/2022

## 2022-08-18 NOTE — TOC Transition Note (Addendum)
Transition of Care [facility]) - CM/SW Discharge Note   Patient Details  Name: Denise Hudson MRN: [id number] Date of Birth: 03-20-1961  Transition of Care [facility] CM/SW Contact:  Kermit Balo, RN Phone Number: 08/18/2022, 10:22 AM   Clinical Narrative:    Pt is discharging home with home IV abx. She received education on Friday from [facility] with [employer]. [employer] to provide the IV abx to the home. [name] has arranged for [employer] to provide the [facility].  Walker for home ordered through [employer] and will be delivered to the room. Pt has transportation home.   Final next level of care: Home w Home Health Services Barriers to Discharge: No Barriers Identified   Patient Goals and CMS Choice      Discharge Placement                         Discharge Plan and Services Additional resources added to the After Visit Summary for     Discharge Planning Services: CM Consult                      HH Arranged: RN HH Agency:  Human resources officer) Date [facility] Agency Contacted: 08/15/22   Representative spoke with at [facility] Agency: Elita Quick with Julianne Rice has arranged [employer]  Social Determinants of Health (SDOH) Interventions SDOH Screenings   Food Insecurity: No Food Insecurity (08/10/2022)  Housing: Low Risk  (08/10/2022)  Transportation Needs: No Transportation Needs (08/10/2022)  Utilities: Not At Risk (08/10/2022)  Tobacco Use: Medium Risk (08/12/2022)     Readmission Risk Interventions     No data to display

## 2022-08-19 DIAGNOSIS — R7881 Bacteremia: Secondary | ICD-10-CM | POA: Diagnosis not present

## 2022-08-20 ENCOUNTER — Ambulatory Visit: Payer: BC Managed Care – PPO | Admitting: Gastroenterology

## 2022-08-20 ENCOUNTER — Ambulatory Visit: Payer: BC Managed Care – PPO | Admitting: Internal Medicine

## 2022-08-20 DIAGNOSIS — R7881 Bacteremia: Secondary | ICD-10-CM | POA: Diagnosis not present

## 2022-08-20 NOTE — Progress Notes (Deleted)
  CC: [facility]/Establish Care  HPI:  [name] is a 62 y.o. with medical history of HTN, nephrolithiasis c/b pyelonephritis, IDA, E.Coli bacteremia c/b discitis  presenting to [facility] for hospital follow up and establishment of care. Admitted from 04/14-04/22 for discitis. Admitted in March for pyelonephritis requiring stent placement for kidney stone. ID saw pt and pt needs 6 weeks of IV antibiotics. PICC line placed.   Past Medical History:  Diagnosis Date   Constipation    History of acute pyelonephritis 07/12/2022   secondary to uropathy obstruction due to kidney stones   History of kidney stones    Microcytic anemia 07/12/2022   Nephrolithiasis    left side calculi non-obstructive per CT 07-18-2022   Ureteral calculi 07/12/2022   right side   Wears contact lenses       Current Outpatient Medications (Cardiovascular):    amLODipine (NORVASC) 5 MG tablet, Take 1 tablet (5 mg total) by mouth daily.   hydrochlorothiazide (HYDRODIURIL) 12.5 MG tablet, Take 1 tablet (12.5 mg total) by mouth daily.   Current Outpatient Medications (Analgesics):    oxyCODONE-acetaminophen (PERCOCET) 7.5-325 MG tablet, Take 1 tablet by mouth every 4 (four) hours as needed for moderate pain.  Current Outpatient Medications (Hematological):    ferrous sulfate 325 (65 FE) MG tablet, Take 1 tablet (325 mg total) by mouth every other day.  Current Outpatient Medications (Other):    cefTRIAXone (ROCEPHIN) IVPB, Inject 2 g into the vein daily. Indication:  E.coli discitis/ostoe First Dose: Yes Last Day of Therapy:  09/23/22 Labs - Once weekly:  CBC/D and BMP, ESR and CRP Method of administration: IV Push Pull PICC line at the completion of IV antibiotics Method of administration may be changed at the discretion of home infusion pharmacist based upon assessment of the patient and/or caregiver's ability to self-administer the medication ordered.   feeding supplement (ENSURE ENLIVE / ENSURE PLUS) LIQD, Take 237  mLs by mouth 3 (three) times daily between meals.   methocarbamol (ROBAXIN) 500 MG tablet, Take 1 tablet (500 mg total) by mouth every 8 (eight) hours as needed for muscle spasms.   ondansetron (ZOFRAN) 4 MG tablet, Take 1 tablet (4 mg total) by mouth every 6 (six) hours as needed for nausea. (Patient not taking: Reported on 08/10/2022)   polyethylene glycol powder (GLYCOLAX/MIRALAX) 17 GM/SCOOP powder, Take 17 g by mouth 3 (three) times daily as needed for mild constipation.   senna-docusate (SENOKOT-S) 8.6-50 MG tablet, Take 2 tablets by mouth 2 (two) times daily as needed for moderate constipation.  No family history on file.  Social History: ***  Review of Systems: ROS negative except for what is noted on the assessment and plan.  There were no vitals filed for this visit.   Physical Exam: General: Well appearing, NAD HENT: normocephalic, atraumatic EYES: conjunctiva non-erythematous, no scleral icterus CV: regular rate, normal rhythm, no murmurs, rubs, gallops. Pulmonary: normal work of breathing on RA, lungs clear to auscultation, no rales, wheezes, rhonchi Abdominal: non-distended, soft, non-tender to palpation, normal BS Skin: Warm and dry, no rashes or lesions Neurological: MS: awake, alert and oriented x3, normal speech and fund of knowledge Motor: moves all extremities antigravity Psych: normal affect    Assessment & Plan:   No problem-specific Assessment & Plan notes found for this encounter.   See Encounters Tab for problem based charting.  Patient discussed with Dr. {NAMES:[id number]::"Denise Hudson","Denise Hudson","Denise Hudson","Denise Hudson","Denise Hudson","Denise Hudson","Denise Hudson","Denise Hudson"} Gwenevere Abbot, MD Denise Hudson. [facility] Medicine Residency, PGY-2

## 2022-08-21 DIAGNOSIS — R7881 Bacteremia: Secondary | ICD-10-CM | POA: Diagnosis not present

## 2022-08-22 DIAGNOSIS — R7881 Bacteremia: Secondary | ICD-10-CM | POA: Diagnosis not present

## 2022-08-23 DIAGNOSIS — R7881 Bacteremia: Secondary | ICD-10-CM | POA: Diagnosis not present

## 2022-08-24 DIAGNOSIS — N10 Acute tubulo-interstitial nephritis: Secondary | ICD-10-CM | POA: Diagnosis not present

## 2022-08-24 DIAGNOSIS — R7881 Bacteremia: Secondary | ICD-10-CM | POA: Diagnosis not present

## 2022-08-24 DIAGNOSIS — B9620 Unspecified Escherichia coli [E. coli] as the cause of diseases classified elsewhere: Secondary | ICD-10-CM | POA: Diagnosis not present

## 2022-08-25 DIAGNOSIS — R7881 Bacteremia: Secondary | ICD-10-CM | POA: Diagnosis not present

## 2022-08-26 DIAGNOSIS — R7881 Bacteremia: Secondary | ICD-10-CM | POA: Diagnosis not present

## 2022-08-27 DIAGNOSIS — R7881 Bacteremia: Secondary | ICD-10-CM | POA: Diagnosis not present

## 2022-08-28 DIAGNOSIS — R7881 Bacteremia: Secondary | ICD-10-CM | POA: Diagnosis not present

## 2022-08-29 DIAGNOSIS — R7881 Bacteremia: Secondary | ICD-10-CM | POA: Diagnosis not present

## 2022-08-30 DIAGNOSIS — R7881 Bacteremia: Secondary | ICD-10-CM | POA: Diagnosis not present

## 2022-08-31 DIAGNOSIS — R7881 Bacteremia: Secondary | ICD-10-CM | POA: Diagnosis not present

## 2022-09-01 DIAGNOSIS — R7881 Bacteremia: Secondary | ICD-10-CM | POA: Diagnosis not present

## 2022-09-02 DIAGNOSIS — M4648 Discitis, unspecified, sacral and sacrococcygeal region: Secondary | ICD-10-CM | POA: Diagnosis not present

## 2022-09-02 DIAGNOSIS — R7881 Bacteremia: Secondary | ICD-10-CM | POA: Diagnosis not present

## 2022-09-03 DIAGNOSIS — R7881 Bacteremia: Secondary | ICD-10-CM | POA: Diagnosis not present

## 2022-09-04 DIAGNOSIS — R7881 Bacteremia: Secondary | ICD-10-CM | POA: Diagnosis not present

## 2022-09-05 ENCOUNTER — Ambulatory Visit: Payer: BC Managed Care – PPO | Admitting: Gastroenterology

## 2022-09-05 DIAGNOSIS — R7881 Bacteremia: Secondary | ICD-10-CM | POA: Diagnosis not present

## 2022-09-06 DIAGNOSIS — R7881 Bacteremia: Secondary | ICD-10-CM | POA: Diagnosis not present

## 2022-09-07 DIAGNOSIS — R7881 Bacteremia: Secondary | ICD-10-CM | POA: Diagnosis not present

## 2022-09-08 DIAGNOSIS — R7881 Bacteremia: Secondary | ICD-10-CM | POA: Diagnosis not present

## 2022-09-09 ENCOUNTER — Inpatient Hospital Stay: Payer: BC Managed Care – PPO | Admitting: Infectious Diseases

## 2022-09-09 DIAGNOSIS — R7881 Bacteremia: Secondary | ICD-10-CM | POA: Diagnosis not present

## 2022-09-09 DIAGNOSIS — M4646 Discitis, unspecified, lumbar region: Secondary | ICD-10-CM | POA: Diagnosis not present

## 2022-09-10 DIAGNOSIS — R7881 Bacteremia: Secondary | ICD-10-CM | POA: Diagnosis not present

## 2022-09-11 DIAGNOSIS — R7881 Bacteremia: Secondary | ICD-10-CM | POA: Diagnosis not present

## 2022-09-12 DIAGNOSIS — R7881 Bacteremia: Secondary | ICD-10-CM | POA: Diagnosis not present

## 2022-09-13 DIAGNOSIS — R7881 Bacteremia: Secondary | ICD-10-CM | POA: Diagnosis not present

## 2022-09-14 DIAGNOSIS — R7881 Bacteremia: Secondary | ICD-10-CM | POA: Diagnosis not present

## 2022-09-15 DIAGNOSIS — R7881 Bacteremia: Secondary | ICD-10-CM | POA: Diagnosis not present

## 2022-09-16 DIAGNOSIS — R7881 Bacteremia: Secondary | ICD-10-CM | POA: Diagnosis not present

## 2022-09-16 DIAGNOSIS — M4644 Discitis, unspecified, thoracic region: Secondary | ICD-10-CM | POA: Diagnosis not present

## 2022-09-17 ENCOUNTER — Encounter (HOSPITAL_COMMUNITY): Payer: Self-pay

## 2022-09-17 ENCOUNTER — Ambulatory Visit (INDEPENDENT_AMBULATORY_CARE_PROVIDER_SITE_OTHER): Payer: BC Managed Care – PPO | Admitting: Infectious Diseases

## 2022-09-17 ENCOUNTER — Other Ambulatory Visit: Payer: Self-pay

## 2022-09-17 ENCOUNTER — Emergency Department (HOSPITAL_COMMUNITY): Payer: BC Managed Care – PPO

## 2022-09-17 ENCOUNTER — Emergency Department (HOSPITAL_COMMUNITY)
Admission: EM | Admit: 2022-09-17 | Discharge: 2022-09-17 | Disposition: A | Discharge status: Home / Self Care | Payer: BC Managed Care – PPO | Attending: Emergency Medicine | Admitting: Emergency Medicine

## 2022-09-17 DIAGNOSIS — M4647 Discitis, unspecified, lumbosacral region: Secondary | ICD-10-CM | POA: Diagnosis not present

## 2022-09-17 DIAGNOSIS — M4646 Discitis, unspecified, lumbar region: Secondary | ICD-10-CM | POA: Diagnosis not present

## 2022-09-17 DIAGNOSIS — Z79899 Other long term (current) drug therapy: Secondary | ICD-10-CM | POA: Insufficient documentation

## 2022-09-17 DIAGNOSIS — M48061 Spinal stenosis, lumbar region without neurogenic claudication: Secondary | ICD-10-CM | POA: Diagnosis not present

## 2022-09-17 DIAGNOSIS — G062 Extradural and subdural abscess, unspecified: Secondary | ICD-10-CM | POA: Diagnosis not present

## 2022-09-17 DIAGNOSIS — R7881 Bacteremia: Secondary | ICD-10-CM | POA: Diagnosis not present

## 2022-09-17 DIAGNOSIS — R52 Pain, unspecified: Secondary | ICD-10-CM | POA: Diagnosis not present

## 2022-09-17 DIAGNOSIS — Z452 Encounter for adjustment and management of vascular access device: Secondary | ICD-10-CM | POA: Insufficient documentation

## 2022-09-17 DIAGNOSIS — M4626 Osteomyelitis of vertebra, lumbar region: Secondary | ICD-10-CM | POA: Diagnosis not present

## 2022-09-17 DIAGNOSIS — M549 Dorsalgia, unspecified: Secondary | ICD-10-CM | POA: Diagnosis not present

## 2022-09-17 DIAGNOSIS — M4624 Osteomyelitis of vertebra, thoracic region: Secondary | ICD-10-CM

## 2022-09-17 DIAGNOSIS — N281 Cyst of kidney, acquired: Secondary | ICD-10-CM | POA: Diagnosis not present

## 2022-09-17 LAB — CBC WITH DIFFERENTIAL/PLATELET
Abs Immature Granulocytes: 0.02 10*3/uL (ref 0.00–0.07)
Basophils Absolute: 0.1 10*3/uL (ref 0.0–0.1)
Basophils Relative: 2 %
Eosinophils Absolute: 0.1 10*3/uL (ref 0.0–0.5)
Eosinophils Relative: 2 %
HCT: 33.4 % — ABNORMAL LOW (ref 36.0–46.0)
Hemoglobin: 9.6 g/dL — ABNORMAL LOW (ref 12.0–15.0)
Immature Granulocytes: 0 %
Lymphocytes Relative: 24 %
Lymphs Abs: 1.4 10*3/uL (ref 0.7–4.0)
MCH: 22.5 pg — ABNORMAL LOW (ref 26.0–34.0)
MCHC: 28.7 g/dL — ABNORMAL LOW (ref 30.0–36.0)
MCV: 78.4 fL — ABNORMAL LOW (ref 80.0–100.0)
Monocytes Absolute: 0.5 10*3/uL (ref 0.1–1.0)
Monocytes Relative: 8 %
Neutro Abs: 3.9 10*3/uL (ref 1.7–7.7)
Neutrophils Relative %: 64 %
Platelets: 331 10*3/uL (ref 150–400)
RBC: 4.26 MIL/uL (ref 3.87–5.11)
RDW: 19.6 % — ABNORMAL HIGH (ref 11.5–15.5)
WBC: 6 10*3/uL (ref 4.0–10.5)
nRBC: 0 % (ref 0.0–0.2)

## 2022-09-17 LAB — URINALYSIS, ROUTINE W REFLEX MICROSCOPIC
Bilirubin Urine: NEGATIVE
Glucose, UA: NEGATIVE mg/dL
Hgb urine dipstick: NEGATIVE
Ketones, ur: NEGATIVE mg/dL
Nitrite: NEGATIVE
Protein, ur: NEGATIVE mg/dL
Specific Gravity, Urine: 1.019 (ref 1.005–1.030)
pH: 5 (ref 5.0–8.0)

## 2022-09-17 LAB — COMPREHENSIVE METABOLIC PANEL
ALT: 102 U/L — ABNORMAL HIGH (ref 0–44)
AST: 82 U/L — ABNORMAL HIGH (ref 15–41)
Albumin: 3.1 g/dL — ABNORMAL LOW (ref 3.5–5.0)
Alkaline Phosphatase: 204 U/L — ABNORMAL HIGH (ref 38–126)
Anion gap: 11 (ref 5–15)
BUN: 26 mg/dL — ABNORMAL HIGH (ref 6–20)
CO2: 25 mmol/L (ref 22–32)
Calcium: 9.4 mg/dL (ref 8.9–10.3)
Chloride: 102 mmol/L (ref 98–111)
Creatinine, Ser: 0.8 mg/dL (ref 0.44–1.00)
GFR, Estimated: >60 mL/min (ref >60)
Glucose, Bld: 99 mg/dL (ref 70–99)
Potassium: 3.8 mmol/L (ref 3.5–5.1)
Sodium: 138 mmol/L (ref 135–145)
Total Bilirubin: 0.4 mg/dL (ref 0.3–1.2)
Total Protein: 8 g/dL (ref 6.5–8.1)

## 2022-09-17 MED ORDER — OXYCODONE-ACETAMINOPHEN 5-325 MG PO TABS
1.0000 | ORAL_TABLET | Freq: Once | ORAL | Status: AC
  Administered 2022-09-17: 1 via ORAL
  Filled 2022-09-17: qty 1

## 2022-09-17 MED ORDER — GADOBUTROL 1 MMOL/ML IV SOLN
5.5000 mL | Freq: Once | INTRAVENOUS | Status: AC | PRN
  Administered 2022-09-17: 5.5 mL via INTRAVENOUS

## 2022-09-17 MED ORDER — FENTANYL CITRATE PF 50 MCG/ML IJ SOSY
50.0000 ug | PREFILLED_SYRINGE | Freq: Once | INTRAMUSCULAR | Status: AC
  Administered 2022-09-17: 50 ug via INTRAVENOUS
  Filled 2022-09-17: qty 1

## 2022-09-17 MED ORDER — OXYCODONE-ACETAMINOPHEN 5-325 MG PO TABS
1.0000 | ORAL_TABLET | Freq: Four times a day (QID) | ORAL | 0 refills | Status: DC | PRN
  Filled 2022-09-17: qty 15, 4d supply, fill #0

## 2022-09-17 NOTE — Progress Notes (Deleted)
Patient Active Problem List   Diagnosis Date Noted   Lung nodule 08/13/2022   Hyponatremia 08/13/2022   Protein-calorie malnutrition, severe (HCC) 08/12/2022   E coli bacteremia 08/11/2022   Discitis of thoracic region 08/11/2022   Hypokalemia 08/10/2022   Discitis of lumbosacral region 08/10/2022   Benign essential HTN 08/10/2022   Transaminitis 07/16/2022   Iron deficiency anemia 07/16/2022   Ureterolithiasis 07/12/2022   Nephrolith 02/10/2017    Patient's Medications  New Prescriptions   No medications on file  Previous Medications   AMLODIPINE (NORVASC) 5 MG TABLET    Take 1 tablet (5 mg total) by mouth daily.   CEFTRIAXONE (ROCEPHIN) IVPB    Inject 2 g into the vein daily. Indication:  E.coli discitis/ostoe First Dose: Yes Last Day of Therapy:  09/23/22 Labs - Once weekly:  CBC/D and BMP, ESR and CRP Method of administration: IV Push Pull PICC line at the completion of IV antibiotics Method of administration may be changed at the discretion of home infusion pharmacist based upon assessment of the patient and/or caregiver's ability to self-administer the medication ordered.   FEEDING SUPPLEMENT (ENSURE ENLIVE / ENSURE PLUS) LIQD    Take 237 mLs by mouth 3 (three) times daily between meals.   FERROUS SULFATE 325 (65 FE) MG TABLET    Take 1 tablet (325 mg total) by mouth every other day.   HYDROCHLOROTHIAZIDE (HYDRODIURIL) 12.5 MG TABLET    Take 1 tablet (12.5 mg total) by mouth daily.   METHOCARBAMOL (ROBAXIN) 500 MG TABLET    Take 1 tablet (500 mg total) by mouth every 8 (eight) hours as needed for muscle spasms.   ONDANSETRON (ZOFRAN) 4 MG TABLET    Take 1 tablet (4 mg total) by mouth every 6 (six) hours as needed for nausea.   OXYCODONE-ACETAMINOPHEN (PERCOCET) 7.5-325 MG TABLET    Take 1 tablet by mouth every 4 (four) hours as needed for moderate pain.   POLYETHYLENE GLYCOL POWDER (GLYCOLAX/MIRALAX) 17 GM/SCOOP POWDER    Take 17 g by mouth 3 (three) times daily  as needed for mild constipation.   SENNA-DOCUSATE (SENOKOT-S) 8.6-50 MG TABLET    Take 2 tablets by mouth 2 (two) times daily as needed for moderate constipation.  Modified Medications   No medications on file  Discontinued Medications   No medications on file    Subjective: ***   Review of Systems: ROS  Past Medical History:  Diagnosis Date   Constipation    History of acute pyelonephritis 07/12/2022   secondary to uropathy obstruction due to kidney stones   History of kidney stones    Microcytic anemia 07/12/2022   Nephrolithiasis    left side calculi non-obstructive per CT 07-18-2022   Ureteral calculi 07/12/2022   right side   Wears contact lenses     Social History   Tobacco Use   Smoking status: Former    Years: 20    Types: Cigarettes    Quit date: 02/25/2014    Years since quitting: 8.5   Smokeless tobacco: Never  Vaping Use   Vaping Use: Never used  Substance Use Topics   Alcohol use: No   Drug use: No    No family history on file.  No Known Allergies  Health Maintenance  Topic Date Due   COVID-19 Vaccine (1) Never done   Hepatitis C Screening  Never done   PAP SMEAR-Modifier  Never done   COLONOSCOPY (Pts 45-42yrs Insurance  coverage will need to be confirmed)  Never done   MAMMOGRAM  Never done   Zoster Vaccines- Shingrix (1 of 2) Never done   INFLUENZA VACCINE  11/27/2022   DTaP/Tdap/Td (2 - Td or Tdap) 09/22/2025   HIV Screening  Completed   HPV VACCINES  Aged Out    Objective:  There were no vitals filed for this visit. There is no height or weight on file to calculate BMI.  Physical Exam Constitutional:      Appearance: Normal appearance.  HENT:     Head: Normocephalic and atraumatic.      Mouth: Mucous membranes are moist.  Eyes:    Conjunctiva/sclera: Conjunctivae normal.     Pupils: Pupils are equal, round, and reactive to light.   Cardiovascular:     Rate and Rhythm: Normal rate and regular rhythm.     Heart sounds: No  murmur heard. No friction rub. No gallop.   Pulmonary:     Effort: Pulmonary effort is normal.     Breath sounds: Normal breath sounds.   Abdominal:     General: Non distended     Palpations: soft.   Musculoskeletal:        General: Normal range of motion.   Skin:    General: Skin is warm and dry.     Comments:  Neurological:     General: grossly non focal     Mental Status: awake, alert and oriented to person, place, and time.   Psychiatric:        Mood and Affect: Mood normal.   Lab Results Lab Results  Component Value Date   WBC 9.1 08/17/2022   HGB 7.7 (L) 08/17/2022   HCT 25.7 (L) 08/17/2022   MCV 74.3 (L) 08/17/2022   PLT 387 08/17/2022    Lab Results  Component Value Date   CREATININE 0.77 08/17/2022   BUN 20 08/17/2022   NA 135 08/17/2022   K 4.0 08/17/2022   CL 96 (L) 08/17/2022   CO2 26 08/17/2022    Lab Results  Component Value Date   ALT 18 08/09/2022   AST 17 08/09/2022   ALKPHOS 161 (H) 08/09/2022   BILITOT 0.6 08/09/2022    No results found for: "CHOL", "HDL", "LDLCALC", "LDLDIRECT", "TRIG", "CHOLHDL" No results found for: "LABRPR", "RPRTITER" No results found for: "HIV1RNAQUANT", "HIV1RNAVL", "CD4TABS"   Problem List Items Addressed This Visit   None   I have personally spent at least 60 minutes involved in face-to-face and non-face-to-face activities for this patient on the day of the visit. Professional time spent includes the following activities: Preparing to see the patient (review of tests), Obtaining and/or reviewing separately obtained history (admission/discharge record), Performing a medically appropriate examination and/or evaluation , Ordering medications/tests/procedures, referring and communicating with other health care professionals, Documenting clinical information in the EMR, Independently interpreting results (not separately reported), Communicating results to the patient/family/caregiver, Counseling and educating the  patient/family/caregiver and Care coordination (not separately reported).   Victoriano Lain, MD [facility] 09/17/2022, 2:31 PM

## 2022-09-17 NOTE — ED Provider Triage Note (Signed)
Emergency Medicine Provider Triage Evaluation Note  Denise Hudson , a 62 y.o. female  was evaluated in triage.  Patient sent by infectious disease for an MRI.  Patient had discitis diagnosed in April.  She is following with infectious disease but continues to have severe left leg pain and inability to bear weight.  Also having severe back pain.  Patient currently has a PICC line for daily Rocephin outpatient for her discitis.  Review of Systems  Positive:  Negative:   Physical Exam  BP (!) 153/83 (BP Location: Left Arm)   Pulse 75   Temp 99.4 F (37.4 C) (Oral)   Resp 14   LMP 09/23/2015   SpO2 98%  Gen:   Awake, no distress   Resp:  Normal effort  MSK:   Moves extremities without difficulty  Other:    Medical Decision Making  Medically screening exam initiated at 3:53 PM.  Appropriate orders placed.  MEHGAN MURGIA was informed that the remainder of the evaluation will be completed by another provider, this initial triage assessment does not replace that evaluation, and the importance of remaining in the ED until their evaluation is complete.   MRI ordered as previously ordered by infectious disease   Kolton Kienle A, PA-C 09/17/22 1558

## 2022-09-17 NOTE — Progress Notes (Addendum)
Patient Active Problem List   Diagnosis Date Noted   Lung nodule 08/13/2022   Hyponatremia 08/13/2022   Protein-calorie malnutrition, severe (HCC) 08/12/2022   E coli bacteremia 08/11/2022   Discitis of thoracic region 08/11/2022   Hypokalemia 08/10/2022   Discitis of lumbosacral region 08/10/2022   Benign essential HTN 08/10/2022   Transaminitis 07/16/2022   Iron deficiency anemia 07/16/2022   Ureterolithiasis 07/12/2022   Nephrolith 02/10/2017    Patient's Medications  New Prescriptions   No medications on file  Previous Medications   AMLODIPINE (NORVASC) 5 MG TABLET    Take 1 tablet (5 mg total) by mouth daily.   CEFTRIAXONE (ROCEPHIN) IVPB    Inject 2 g into the vein daily. Indication:  E.coli discitis/ostoe First Dose: Yes Last Day of Therapy:  09/23/22 Labs - Once weekly:  CBC/D and BMP, ESR and CRP Method of administration: IV Push Pull PICC line at the completion of IV antibiotics Method of administration may be changed at the discretion of home infusion pharmacist based upon assessment of the patient and/or caregiver's ability to self-administer the medication ordered.   FEEDING SUPPLEMENT (ENSURE ENLIVE / ENSURE PLUS) LIQD    Take 237 mLs by mouth 3 (three) times daily between meals.   FERROUS SULFATE 325 (65 FE) MG TABLET    Take 1 tablet (325 mg total) by mouth every other day.   HYDROCHLOROTHIAZIDE (HYDRODIURIL) 12.5 MG TABLET    Take 1 tablet (12.5 mg total) by mouth daily.   METHOCARBAMOL (ROBAXIN) 500 MG TABLET    Take 1 tablet (500 mg total) by mouth every 8 (eight) hours as needed for muscle spasms.   ONDANSETRON (ZOFRAN) 4 MG TABLET    Take 1 tablet (4 mg total) by mouth every 6 (six) hours as needed for nausea.   OXYCODONE-ACETAMINOPHEN (PERCOCET) 7.5-325 MG TABLET    Take 1 tablet by mouth every 4 (four) hours as needed for moderate pain.   POLYETHYLENE GLYCOL POWDER (GLYCOLAX/MIRALAX) 17 GM/SCOOP POWDER    Take 17 g by mouth 3 (three) times daily as  needed for mild constipation.   SENNA-DOCUSATE (SENOKOT-S) 8.6-50 MG TABLET    Take 2 tablets by mouth 2 (two) times daily as needed for moderate constipation.  Modified Medications   No medications on file  Discontinued Medications   No medications on file    Subjective: 62 Y O female with PMH as below including history of kidney stones, pyelonephritis secondary to obstructing stone s/p  cystoscopy with right ureteral stent placement on March 17, discharged on 14 days of cefadroxil ( 3/16 urine culture growing E. Coli) followed by lithotripsy with cystoscopy with right ureteral stent exchange on 3/27 and eventual stent removal who is here for HFU for last admission for E coli bacteremia/L5-s1/t11-t12 discitis and Osteomyelitis. She underwent  L5-s1 disc aspiration 4/16.  Cx  E coli. Discharged on 08/18/22 to complete 6 weeks of IV ceftriaxone, EOT 09/23/22.   09/17/22 Accompanied by a friend. Getting IV ceftriaxone from rt arm PICC without any issues related to PICC or abtx. Denies fevers, chills. Denies nausea, vomiting and diarrhea. Still has severe back pain which is worse Complains of severe shooting pain in her left lateral thigh which has been constant since the time of discharge. She has also completed her analgesics given to her at the time of discharge. Unclear if she needs analgesics or worsening of infection. She cannot stand for a while with support. She need a rolling walker at  home. She feels there has not been any improvement in the left thigh pain since discharge. Denies any numbness and tingling in the lower extremities, bowel or bladder incontinence.   Review of Systems: all systems reviewed with pertinent positives and negatives as listed above.   Past Medical History:  Diagnosis Date   Constipation    History of acute pyelonephritis 07/12/2022   secondary to uropathy obstruction due to kidney stones   History of kidney stones    Microcytic anemia 07/12/2022   Nephrolithiasis     left side calculi non-obstructive per CT 07-18-2022   Ureteral calculi 07/12/2022   right side   Wears contact lenses    Past Surgical History:  Procedure Laterality Date   APPENDECTOMY  age 27   CYSTOSCOPY WITH STENT PLACEMENT Right 02/10/2017   Procedure: CYSTOSCOPY WITH RIGHT STENT PLACEMENT;  Surgeon: Rene Paci, MD;  Location: [facility];  Service: Urology;  Laterality: Right;   CYSTOSCOPY/URETEROSCOPY/HOLMIUM LASER/STENT PLACEMENT Right 03/04/2017   Procedure: CYSTOSCOPY/URETEROSCOPY/HOLMIUM LASER/STENT PLACEMENT;  Surgeon: Rene Paci, MD;  Location: [facility];  Service: Urology;  Laterality: Right;  NEEDS 30 MIN FOR PROCEDURE   CYSTOSCOPY/URETEROSCOPY/HOLMIUM LASER/STENT PLACEMENT Right 07/13/2022   Procedure: CYSTOSCOPY RIGHT URETERAL STENT PLACEMENT;  Surgeon: Crista Elliot, MD;  Location: [facility];  Service: Urology;  Laterality: Right;   CYSTOSCOPY/URETEROSCOPY/HOLMIUM LASER/STENT PLACEMENT Right 07/23/2022   Procedure: CYSTOSCOPY RIGHT URETEROSCOPY, HOLMIUM LASER LITHOTRIPSY, AND RIGHT URETERAL STENT PLACEMENT;  Surgeon: Rene Paci, MD;  Location: [facility];  Service: Urology;  Laterality: Right;  90 MINUTES   EXTRACORPOREAL SHOCK WAVE LITHOTRIPSY  2008   IR LUMBAR DISC ASPIRATION W/IMG GUIDE  08/12/2022     Social History   Tobacco Use   Smoking status: Former    Years: 20    Types: Cigarettes    Quit date: 02/25/2014    Years since quitting: 8.5   Smokeless tobacco: Never  Vaping Use   Vaping Use: Never used  Substance Use Topics   Alcohol use: No   Drug use: No    No family history on file.  No Known Allergies  Health Maintenance  Topic Date Due   COVID-19 Vaccine (1) Never done   Hepatitis C Screening  Never done   PAP SMEAR-Modifier  Never done   COLONOSCOPY (Pts 45-79yrs Insurance coverage will need to be confirmed)  Never done   MAMMOGRAM  Never done   Zoster Vaccines-  Shingrix (1 of 2) Never done   INFLUENZA VACCINE  11/27/2022   DTaP/Tdap/Td (2 - Td or Tdap) 09/22/2025   HIV Screening  Completed   HPV VACCINES  Aged Out    Objective: LMP 09/23/2015    Physical Exam Constitutional:      Appearance: Normal appearance. Sitting in a wheel chair  HENT:     Head: Normocephalic and atraumatic.      Mouth: Mucous membranes are moist.  Eyes:    Conjunctiva/sclera: Conjunctivae normal.     Pupils: Pupils are equal, round, and reactive to light.   Cardiovascular:     Rate and Rhythm: Normal rate and regular rhythm.     Heart sounds: No murmur heard. No friction rub. No gallop.   Pulmonary:     Effort: Pulmonary effort is normal.     Breath sounds: Normal breath sounds.   Abdominal:     General: Non distended     Palpations: soft.   Musculoskeletal:  General: Normal range of motion. Lower lumbar vertebral tenderness   Skin:    General: Skin is warm and dry.     Comments:  Neurological:     General: Power 5/5 in upper extremities, RT LE 5/5, LLE 4 to 4+/5 ( ? Limited due to pain).     Mental Status: awake, alert and oriented to person, place, and time. She is in a wheel chair and difficult to stand for few minutes due to the pain.   Psychiatric:        Mood and Affect: Mood normal.   Lab Results Lab Results  Component Value Date   WBC 9.1 08/17/2022   HGB 7.7 (L) 08/17/2022   HCT 25.7 (L) 08/17/2022   MCV 74.3 (L) 08/17/2022   PLT 387 08/17/2022    Lab Results  Component Value Date   CREATININE 0.77 08/17/2022   BUN 20 08/17/2022   NA 135 08/17/2022   K 4.0 08/17/2022   CL 96 (L) 08/17/2022   CO2 26 08/17/2022    Lab Results  Component Value Date   ALT 18 08/09/2022   AST 17 08/09/2022   ALKPHOS 161 (H) 08/09/2022   BILITOT 0.6 08/09/2022    No results found for: "CHOL", "HDL", "LDLCALC", "LDLDIRECT", "TRIG", "CHOLHDL" No results found for: "LABRPR", "RPRTITER" No results found for: "HIV1RNAQUANT", "HIV1RNAVL",  "CD4TABS"  Assessment/Plan  87 Y O female with PMH as below including history of kidney stones, status post March admission with pyelonephritis secondary to obstructing stone s/p  cystoscopy with right ureteral stent placement on March 17, discharged on 14 days of cefadroxil ( 3/16 urine culture growing E. Coli) followed by lithotripsy with cystoscopy with right ureteral stent exchange on 3/27 and eventual stent removal admitted with  #Intractable pain ( lower back and left lateral thigh) -She will be going to ER now to get MRI L spine stat to r/o worsening of epidural phlegmon +/-  needs any further intervention pending MRI spine. If negative, she will need proper pain regimen for her pain control. I have informed Dr Lucianne Muss at ED regarding patient's arrival.   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 Cx  E coli  - Repeat blood cx 4/16 NG  - continue IV ceftriaxone as planned pending MRI  - Sending to ED for above reason  I have personally spent 50  minutes involved in face-to-face and non-face-to-face activities for this patient on the day of the visit. Professional time spent includes the following activities: Preparing to see the patient (review of tests), Obtaining and/or reviewing separately obtained history (admission/discharge record), Performing a medically appropriate examination and/or evaluation , Ordering medications/tests/procedures, referring and communicating with other health care professionals, Documenting clinical information in the EMR, Independently interpreting results (not separately reported), Communicating results to the patient/family/caregiver, Counseling and educating the patient/family/caregiver and Care coordination (not separately reported).   Victoriano Lain, MD [facility] for Infectious Disease [facility] 09/17/2022, 2:34 PM

## 2022-09-17 NOTE — ED Triage Notes (Signed)
Pt came in via POV from her PCP requesting to have an MRI performed since she had a spinal biopsy done recently & she is still having the same s/s since before it was done. She cannot stand on her Lt leg d/t severe shooting pains. Pt endorsees being checked for a "blood infection" recently & does have a PICC line in her Lt arm at present. A/Ox4.

## 2022-09-17 NOTE — ED Notes (Signed)
Pt in MRI at this time. MRI notified to bring pt to room 17 after MRI is complete.

## 2022-09-17 NOTE — Discharge Instructions (Signed)
As we discussed, MRI of your back did not show any worsening of your infection.  I recommend that you continue to take your IV antibiotics as prescribed every day and follow-up closely with your infectious disease specialist.  I have given you a prescription for pain medication for you to take as prescribed as needed for severe pain only.  Do not drive or operate heavy machinery while taking this medication as it can be sedating.  Return if development of any new or worsening symptoms.

## 2022-09-17 NOTE — ED Provider Notes (Signed)
[place] EMERGENCY DEPARTMENT AT [facility] Provider Note   CSN: [id number] Arrival date & time: 09/17/22  1501     History  Chief Complaint  Patient presents with   Lt Leg Pain   Back Pain    Denise Hudson is a 62 y.o. female.  Patient with history of pyelonephritis, e coli bacteremia, L5-S1/T11-T12 discitis and osteomyelitis currently with PICC line in place receiving IV Rocephin x 6 weeks presents today from her infectious disease outpatient appointment for an MRI of her back with concern for developing epidural abscess. Patient states that her back pain has been persistent and unchanged since onset when she was diagnosed with discitis. She was initially discharged with Percocet and a muscle relaxer, however she is subsequently ran out of these and her pain is uncontrolled.  She states that she has pain in the middle of her back that radiates down her left leg to her left knee.  She denies any loss of bowel or bladder function or saddle paresthesias.  She denies any fevers or chills.  She is able to walk but states that she is in such significant pain that standing brings her to tears.  The history is provided by the patient. No language interpreter was used.  Back Pain      Home Medications Prior to Admission medications   Medication Sig Start Date End Date Taking? Authorizing Provider  amLODipine (NORVASC) 5 MG tablet Take 1 tablet (5 mg total) by mouth daily. 08/18/22   Danford, Earl Lites, MD  cefTRIAXone (ROCEPHIN) IVPB Inject 2 g into the vein daily. Indication:  E.coli discitis/ostoe First Dose: Yes Last Day of Therapy:  09/23/22 Labs - Once weekly:  CBC/D and BMP, ESR and CRP Method of administration: IV Push Pull PICC line at the completion of IV antibiotics Method of administration may be changed at the discretion of home infusion pharmacist based upon assessment of the patient and/or caregiver's ability to self-administer the medication ordered. 08/15/22  09/23/22  DanfordEarl Lites, MD  feeding supplement (ENSURE ENLIVE / ENSURE PLUS) LIQD Take 237 mLs by mouth 3 (three) times daily between meals. 08/18/22   Danford, Earl Lites, MD  ferrous sulfate 325 (65 FE) MG tablet Take 1 tablet (325 mg total) by mouth every other day. 08/18/22   Danford, Earl Lites, MD  hydrochlorothiazide (HYDRODIURIL) 12.5 MG tablet Take 1 tablet (12.5 mg total) by mouth daily. 08/18/22   Danford, Earl Lites, MD  methocarbamol (ROBAXIN) 500 MG tablet Take 1 tablet (500 mg total) by mouth every 8 (eight) hours as needed for muscle spasms. 08/18/22   Danford, Earl Lites, MD  ondansetron (ZOFRAN) 4 MG tablet Take 1 tablet (4 mg total) by mouth every 6 (six) hours as needed for nausea. Patient not taking: Reported on 08/10/2022 07/19/22   Alberteen Sam, MD  oxyCODONE-acetaminophen (PERCOCET) 7.5-325 MG tablet Take 1 tablet by mouth every 4 (four) hours as needed for moderate pain. 08/18/22   Danford, Earl Lites, MD  polyethylene glycol powder (GLYCOLAX/MIRALAX) 17 GM/SCOOP powder Take 17 g by mouth 3 (three) times daily as needed for mild constipation. 08/18/22   Danford, Earl Lites, MD  senna-docusate (SENOKOT-S) 8.6-50 MG tablet Take 2 tablets by mouth 2 (two) times daily as needed for moderate constipation. 08/18/22   Danford, Earl Lites, MD      Allergies    Patient has no known allergies.    Review of Systems   Review of Systems  Musculoskeletal:  Positive  for arthralgias and back pain.  All other systems reviewed and are negative.   Physical Exam Updated Vital Signs BP (!) 172/77   Pulse 67   Temp 99.4 F (37.4 C) (Oral)   Resp 13   LMP 09/23/2015   SpO2 100%  Physical Exam Vitals and nursing note reviewed.  Constitutional:      General: She is not in acute distress.    Appearance: Normal appearance. She is normal weight. She is not ill-appearing, toxic-appearing or diaphoretic.  HENT:     Head: Normocephalic and atraumatic.   Cardiovascular:     Rate and Rhythm: Normal rate.  Pulmonary:     Effort: Pulmonary effort is normal. No respiratory distress.  Abdominal:     General: Abdomen is flat.     Palpations: Abdomen is soft.     Tenderness: There is no abdominal tenderness.  Musculoskeletal:        General: Normal range of motion.     Cervical back: Normal range of motion.     Comments: TTP throughout the lower thoracic and lumbar spine. TTP throughout the left upper leg area. No focal bony tenderness, erythema, or warmth. ROM intact with pain.   Skin:    General: Skin is warm and dry.  Neurological:     General: No focal deficit present.     Mental Status: She is alert.  Psychiatric:        Mood and Affect: Mood normal.        Behavior: Behavior normal.     ED Results / Procedures / Treatments   Labs (all labs ordered are listed, but only abnormal results are displayed) Labs Reviewed  COMPREHENSIVE METABOLIC PANEL - Abnormal; Notable for the following components:      Result Value   BUN 26 (*)    Albumin 3.1 (*)    AST 82 (*)    ALT 102 (*)    Alkaline Phosphatase 204 (*)    All other components within normal limits  CBC WITH DIFFERENTIAL/PLATELET - Abnormal; Notable for the following components:   Hemoglobin 9.6 (*)    HCT 33.4 (*)    MCV 78.4 (*)    MCH 22.5 (*)    MCHC 28.7 (*)    RDW 19.6 (*)    All other components within normal limits  URINALYSIS, ROUTINE W REFLEX MICROSCOPIC - Abnormal; Notable for the following components:   Leukocytes,Ua SMALL (*)    Bacteria, UA RARE (*)    All other components within normal limits    EKG None  Radiology MR Lumbar Spine W Wo Contrast  Result Date: 09/17/2022 CLINICAL DATA:  Low back pain, cauda equina syndrome suspected EXAM: MRI LUMBAR SPINE WITHOUT AND WITH CONTRAST TECHNIQUE: Multiplanar and multiecho pulse sequences of the lumbar spine were obtained without and with intravenous contrast. CONTRAST:  5.21mL GADAVIST GADOBUTROL 1 MMOL/ML  IV SOLN COMPARISON:  MRI L Spine 08/10/22 FINDINGS: Segmentation:  Standard. Alignment:  Straightening of the normal lumbar lordosis. Vertebrae: Redemonstrated abnormal contrast enhancement of the inferior and superior aspect of the L5 and S1 vertebral body as well as the L5-S1 disc space. There is abnormal contrast enhancement in the prevertebral soft tissues and in the epidural space at the L5-S1 level. Compared to 08/10/2022, there is interval increase in disc space loss at L5-S1 with new contrast enhancement in the bilateral facet joints. Somewhat geographic contrast enhancement at the inferior and superior endplates of T11 and T12 is nonspecific and favored to be degenerative,  but early discitis osteomyelitis is not excluded. Conus medullaris and cauda equina: Conus extends to the T12-L1 disc space level. Conus and cauda equina appear normal. Paraspinal and other soft tissues: T2 hyperintense lesion in the upper pole of the right kidney is favored to represent a simple renal cyst requiring no further follow-up. Disc levels: Moderate spinal canal narrowing at L1-L2, L2-L3, and L3-L4 secondary to a combination of degenerative disc bulges, ligamentum flavum hypertrophy and congenitally short pedicles. Redemonstrated severe bilateral neural foraminal narrowing at L5-S1. IMPRESSION: 1. Redemonstrated findings of discitis osteomyelitis at L5-S1 with interval increase in disc space loss at this level and new contrast enhancement in the bilateral facet joints. The degree of prevertebral and epidural involvement is unchanged without evidence of an epidural abscess. 2. Somewhat geographic contrast enhancement at the inferior and superior endplates of T11 and T12 likely represents an additional site of discitis osteomyelitis. 3. Moderate spinal canal narrowing at L1-L2, L2-L3, and L3-L4 secondary to a combination of degenerative disc bulges, ligamentum flavum hypertrophy and congenitally short pedicles. 4. Redemonstrated  severe bilateral neural foraminal narrowing at L5-S1. Electronically Signed   By: Lorenza Cambridge M.D.   On: 09/17/2022 17:21    Procedures Procedures    Medications Ordered in ED Medications  fentaNYL (SUBLIMAZE) injection 50 mcg (has no administration in time range)  oxyCODONE-acetaminophen (PERCOCET/ROXICET) 5-325 MG per tablet 1 tablet (1 tablet Oral Given 09/17/22 1603)  gadobutrol (GADAVIST) 1 MMOL/ML injection 5.5 mL (5.5 mLs Intravenous Contrast Given 09/17/22 1659)    ED Course/ Medical Decision Making/ A&P                             Medical Decision Making Amount and/or Complexity of Data Reviewed Labs: ordered.  Risk Prescription drug management.   This patient is a 62 y.o. female who presents to the ED for concern of back pain, this involves an extensive number of treatment options, and is a complaint that carries with it a high risk of complications and morbidity. The emergent differential diagnosis prior to evaluation includes, but is not limited to,  epidural abscess, worsening discitis, Fracture (acute/chronic), muscle strain, cauda equina, spinal stenosis, DDD, ligamentous injury, disk herniation, metastatic cancer, vertebral osteomyelitis, kidney stone, pyelonephritis, AAA, pancreatitis, bowel obstruction, meningitis.  This is not an exhaustive differential.   Past Medical History / Co-morbidities / Social History: history of pyelonephritis, e coli bacteremia, L5-S1/T11-T12 discitis and osteomyelitis currently with PICC line in place receiving IV Rocephin x 6 weeks  Additional history: Chart reviewed. Pertinent results include: Patient saw her infectious disease doctor today. Dr. Elinor Parkinson noted  "She will be going to ER now to get MRI L spine stat to r/o worsening of epidural phlegmon +/-  needs any further intervention pending MRI spine. If negative, she will need proper pain regimen for her pain control."  Physical Exam: Physical exam performed. The pertinent  findings include: TTP throughout the lower back and left upper leg  Lab Tests: I ordered, and personally interpreted labs.  The pertinent results include: No leukocytosis, hemoglobin improved from previous.  Liver enzymes elevated, however consistent with previous testing.   Imaging Studies: I ordered imaging studies including MRI lumbar spine. I independently visualized and interpreted imaging which showed   1. Redemonstrated findings of discitis osteomyelitis at L5-S1 with interval increase in disc space loss at this level and new contrast enhancement in the bilateral facet joints. The degree of prevertebral and epidural involvement is  unchanged without evidence of an epidural abscess. 2. Somewhat geographic contrast enhancement at the inferior and superior endplates of T11 and T12 likely represents an additional site of discitis osteomyelitis. 3. Moderate spinal canal narrowing at L1-L2, L2-L3, and L3-L4 secondary to a combination of degenerative disc bulges, ligamentum flavum hypertrophy and congenitally short pedicles. 4. Redemonstrated severe bilateral neural foraminal narrowing at L5-S1.  I agree with the radiologist interpretation.   Medications: I ordered medication including fentanyl, percocet  for pain. Reevaluation of the patient after these medicines showed that the patient improved. I have reviewed the patients home medicines and have made adjustments as needed.   Disposition: After consideration of the diagnostic results and the patients response to treatment, I feel that emergency department workup does not suggest an emergent condition requiring admission or immediate intervention beyond what has been performed at this time. The plan is: discharge with close outpatient follow-up.  Patient is afebrile, nontoxic-appearing, and in no acute distress with reassuring vital signs.  Laboratory evaluation reveals no leukocytosis.  MRI appears grossly unchanged from previous.  No  signs of epidural abscess or other emergent concerns.  Patient given pain medication with improvement today.  I did offer admission for pain control, however patient prefers to go home.  Will give prescription for Percocet for pain. PDMP reviewed.  Patient advised not to drive or operate heavy machinery while taking this medication.  Evaluation and diagnostic testing in the emergency department does not suggest an emergent condition requiring admission or immediate intervention beyond what has been performed at this time.  Plan for discharge with close PCP follow-up.  Patient is understanding and amenable with plan, educated on red flag symptoms that would prompt immediate return.  Patient discharged in stable condition.   Final Clinical Impression(s) / ED Diagnoses Final diagnoses:  Osteomyelitis of thoracic vertebra (HCC)  Osteomyelitis of lumbar vertebra (HCC)    Rx / DC Orders ED Discharge Orders          Ordered    oxyCODONE-acetaminophen (PERCOCET/ROXICET) 5-325 MG tablet  Every 6 hours PRN        09/17/22 2100          An After Visit Summary was printed and given to the patient.     Vear Clock 09/17/22 2101    Arby Barrette, MD 09/18/22 2159

## 2022-09-18 ENCOUNTER — Telehealth: Payer: Self-pay

## 2022-09-18 ENCOUNTER — Other Ambulatory Visit: Payer: Self-pay | Admitting: Infectious Diseases

## 2022-09-18 ENCOUNTER — Other Ambulatory Visit (HOSPITAL_COMMUNITY): Payer: Self-pay

## 2022-09-18 DIAGNOSIS — R7881 Bacteremia: Secondary | ICD-10-CM | POA: Diagnosis not present

## 2022-09-18 DIAGNOSIS — M4647 Discitis, unspecified, lumbosacral region: Secondary | ICD-10-CM

## 2022-09-18 DIAGNOSIS — R52 Pain, unspecified: Secondary | ICD-10-CM

## 2022-09-18 NOTE — Telephone Encounter (Signed)
Orders sent to Jeri Modena, RN with [employer] to extend OPAT to 10/07/22 per Dr. Elinor Parkinson.   Called patient to notify her, discuss referral, and schedule 3 week follow up prior to end date, no answer. Left HIPAA compliant voicemail requesting callback.   Sandie Ano, RN

## 2022-09-18 NOTE — Telephone Encounter (Signed)
-----   Message from Odette Fraction, MD sent at 09/18/2022  8:04 AM EDT ----- Regarding: Fu Team  Please let HH know to extend her IV abtx until 10/07/22 and arrange fu in 3 weeks prior to end of abtx. She had MRI in ED yesterday which I have reviewed.  Please let patient know I have also put in referral to neurosurgery for her radicular pain management/degenerative spine disease.  Thanks

## 2022-09-18 NOTE — Progress Notes (Signed)
09/18/22 MRI L spine   IMPRESSION: 1. Redemonstrated findings of discitis osteomyelitis at L5-S1 with interval increase in disc space loss at this level and new contrast enhancement in the bilateral facet joints. The degree of prevertebral and epidural involvement is unchanged without evidence of an epidural abscess. 2. Somewhat geographic contrast enhancement at the inferior and superior endplates of T11 and T12 likely represents an additional site of discitis osteomyelitis. 3. Moderate spinal canal narrowing at L1-L2, L2-L3, and L3-L4 secondary to a combination of degenerative disc bulges, ligamentum flavum hypertrophy and congenitally short pedicles. 4. Redemonstrated severe bilateral neural foraminal narrowing at L5-S1.   Will extend IV ceftriaxone to complete 8 weeks of tx. EOT 10/07/22. Fu in 3 weeks prior to end date of abtx Amb referral to Neurosurgery for severe radicular pain and degenerative disc disease.   Odette Fraction, MD Infectious Disease Physician [facility] [address] [place], [place] [address] Phone: [phone]  Fax: [phone]

## 2022-09-19 ENCOUNTER — Other Ambulatory Visit: Payer: Self-pay

## 2022-09-19 ENCOUNTER — Other Ambulatory Visit (HOSPITAL_COMMUNITY): Payer: Self-pay

## 2022-09-19 DIAGNOSIS — R7881 Bacteremia: Secondary | ICD-10-CM | POA: Diagnosis not present

## 2022-09-19 NOTE — Telephone Encounter (Signed)
Spoke with Verlon Au, notified her of OPAT extension and neurosurgery referral. Rescheduled her follow up for 6/6, closer to new OPAT end date.   Sandie Ano, RN

## 2022-09-20 DIAGNOSIS — R7881 Bacteremia: Secondary | ICD-10-CM | POA: Diagnosis not present

## 2022-09-21 DIAGNOSIS — R7881 Bacteremia: Secondary | ICD-10-CM | POA: Diagnosis not present

## 2022-09-22 DIAGNOSIS — R7881 Bacteremia: Secondary | ICD-10-CM | POA: Diagnosis not present

## 2022-09-22 DIAGNOSIS — M4647 Discitis, unspecified, lumbosacral region: Secondary | ICD-10-CM | POA: Diagnosis not present

## 2022-09-23 ENCOUNTER — Ambulatory Visit: Payer: BC Managed Care – PPO | Admitting: Infectious Diseases

## 2022-09-23 DIAGNOSIS — R7881 Bacteremia: Secondary | ICD-10-CM | POA: Diagnosis not present

## 2022-09-24 DIAGNOSIS — R7881 Bacteremia: Secondary | ICD-10-CM | POA: Diagnosis not present

## 2022-09-25 DIAGNOSIS — R7881 Bacteremia: Secondary | ICD-10-CM | POA: Diagnosis not present

## 2022-09-26 DIAGNOSIS — R7881 Bacteremia: Secondary | ICD-10-CM | POA: Diagnosis not present

## 2022-09-27 DIAGNOSIS — R7881 Bacteremia: Secondary | ICD-10-CM | POA: Diagnosis not present

## 2022-09-28 DIAGNOSIS — R7881 Bacteremia: Secondary | ICD-10-CM | POA: Diagnosis not present

## 2022-09-29 DIAGNOSIS — R7881 Bacteremia: Secondary | ICD-10-CM | POA: Diagnosis not present

## 2022-09-30 DIAGNOSIS — B9620 Unspecified Escherichia coli [E. coli] as the cause of diseases classified elsewhere: Secondary | ICD-10-CM | POA: Diagnosis not present

## 2022-09-30 DIAGNOSIS — R7881 Bacteremia: Secondary | ICD-10-CM | POA: Diagnosis not present

## 2022-10-01 DIAGNOSIS — R7881 Bacteremia: Secondary | ICD-10-CM | POA: Diagnosis not present

## 2022-10-02 ENCOUNTER — Encounter: Payer: Self-pay | Admitting: Infectious Diseases

## 2022-10-02 ENCOUNTER — Other Ambulatory Visit: Payer: Self-pay

## 2022-10-02 ENCOUNTER — Other Ambulatory Visit (HOSPITAL_COMMUNITY): Payer: Self-pay

## 2022-10-02 ENCOUNTER — Ambulatory Visit (INDEPENDENT_AMBULATORY_CARE_PROVIDER_SITE_OTHER): Payer: BC Managed Care – PPO | Admitting: Infectious Diseases

## 2022-10-02 VITALS — BP 163/98 | HR 86 | Resp 16 | Ht 72.0 in | Wt 120.0 lb

## 2022-10-02 DIAGNOSIS — Z79899 Other long term (current) drug therapy: Secondary | ICD-10-CM

## 2022-10-02 DIAGNOSIS — Z452 Encounter for adjustment and management of vascular access device: Secondary | ICD-10-CM

## 2022-10-02 DIAGNOSIS — R7401 Elevation of levels of liver transaminase levels: Secondary | ICD-10-CM

## 2022-10-02 DIAGNOSIS — R7881 Bacteremia: Secondary | ICD-10-CM | POA: Diagnosis not present

## 2022-10-02 DIAGNOSIS — M4647 Discitis, unspecified, lumbosacral region: Secondary | ICD-10-CM

## 2022-10-02 MED ORDER — AMOXICILLIN 500 MG PO CAPS
1000.0000 mg | ORAL_CAPSULE | Freq: Three times a day (TID) | ORAL | 1 refills | Status: DC
  Filled 2022-10-02: qty 180, 30d supply, fill #0
  Filled 2022-11-06: qty 180, 30d supply, fill #1

## 2022-10-02 NOTE — Progress Notes (Addendum)
Patient Active Problem List   Diagnosis Date Noted   Epidural abscess 09/17/2022   Medication management 09/17/2022   Intractable pain 09/17/2022   PICC (peripherally inserted central catheter) in place 09/17/2022   Lung nodule 08/13/2022   Hyponatremia 08/13/2022   Protein-calorie malnutrition, severe (HCC) 08/12/2022   E coli bacteremia 08/11/2022   Discitis of thoracic region 08/11/2022   Hypokalemia 08/10/2022   Discitis of lumbosacral region 08/10/2022   Benign essential HTN 08/10/2022   Transaminitis 07/16/2022   Iron deficiency anemia 07/16/2022   Ureterolithiasis 07/12/2022   Nephrolith 02/10/2017    Patient's Medications  New Prescriptions   No medications on file  Previous Medications   AMLODIPINE (NORVASC) 5 MG TABLET    Take 1 tablet (5 mg total) by mouth daily.   FEEDING SUPPLEMENT (ENSURE ENLIVE / ENSURE PLUS) LIQD    Take 237 mLs by mouth 3 (three) times daily between meals.   FERROUS SULFATE 325 (65 FE) MG TABLET    Take 1 tablet (325 mg total) by mouth every other day.   HYDROCHLOROTHIAZIDE (HYDRODIURIL) 12.5 MG TABLET    Take 1 tablet (12.5 mg total) by mouth daily.   METHOCARBAMOL (ROBAXIN) 500 MG TABLET    Take 1 tablet (500 mg total) by mouth every 8 (eight) hours as needed for muscle spasms.   ONDANSETRON (ZOFRAN) 4 MG TABLET    Take 1 tablet (4 mg total) by mouth every 6 (six) hours as needed for nausea.   OXYCODONE-ACETAMINOPHEN (PERCOCET/ROXICET) 5-325 MG TABLET    Take 1 tablet by mouth every 6 hours as needed for severe pain.   POLYETHYLENE GLYCOL POWDER (GLYCOLAX/MIRALAX) 17 GM/SCOOP POWDER    Take 17 g by mouth 3 (three) times daily as needed for mild constipation.   SENNA-DOCUSATE (SENOKOT-S) 8.6-50 MG TABLET    Take 2 tablets by mouth 2 (two) times daily as needed for moderate constipation.  Modified Medications   No medications on file  Discontinued Medications   No medications on file    Subjective: 62 Y O female with PMH as below  including history of kidney stones, pyelonephritis secondary to obstructing stone s/p  cystoscopy with right ureteral stent placement on March 17, discharged on 14 days of cefadroxil ( 3/16 urine culture growing E. Coli) followed by lithotripsy with cystoscopy with right ureteral stent exchange on 3/27 and eventual stent removal who is here for HFU for last admission for E coli bacteremia/L5-s1/t11-t12 discitis and Osteomyelitis. She underwent  L5-s1 disc aspiration 4/16.  Cx  E coli. Discharged on 08/18/22 to complete 6 weeks of IV ceftriaxone, EOT 09/23/22.   09/17/22 sent to ED for severe back pain as well as shooting pain in her left lateral thigh. MRI L spine findings as below.   10/01/22 Accompanied by a friend. Getting IV cefazolin through PICC without any concerns related to PICC or abtx. Discussed MRI L spine findings. Back pain is better controlled, mostly at 5-6/10 on rt the side, not on any analgesics. Still has some pain in the left thigh and feels like a cramp on and off. Denies any concerns for PICC. Denies nausea, vomiting and diarrhea. Has not seen neurosurgery yet. Able to walk few steps without support but needs walker for better support.   Review of Systems: all systems reviewed with pertinent positives and negatives as listed above.   Past Medical History:  Diagnosis Date   Constipation    History of acute pyelonephritis 07/12/2022   secondary to  uropathy obstruction due to kidney stones   History of kidney stones    Microcytic anemia 07/12/2022   Nephrolithiasis    left side calculi non-obstructive per CT 07-18-2022   Ureteral calculi 07/12/2022   right side   Wears contact lenses    Past Surgical History:  Procedure Laterality Date   APPENDECTOMY  age 71   CYSTOSCOPY WITH STENT PLACEMENT Right 02/10/2017   Procedure: CYSTOSCOPY WITH RIGHT STENT PLACEMENT;  Surgeon: Rene Paci, MD;  Location: [facility];  Service: Urology;  Laterality: Right;    CYSTOSCOPY/URETEROSCOPY/HOLMIUM LASER/STENT PLACEMENT Right 03/04/2017   Procedure: CYSTOSCOPY/URETEROSCOPY/HOLMIUM LASER/STENT PLACEMENT;  Surgeon: Rene Paci, MD;  Location[facility];  Service: Urology;  Laterality: Right;  NEEDS 30 MIN FOR PROCEDURE   CYSTOSCOPY/URETEROSCOPY/HOLMIUM LASER/STENT PLACEMENT Right 07/13/2022   Procedure: CYSTOSCOPY RIGHT URETERAL STENT PLACEMENT;  Surgeon: Crista Elliot, MD;  Location: [facility];  Service: Urology;  Laterality: Right;   CYSTOSCOPY/URETEROSCOPY/HOLMIUM LASER/STENT PLACEMENT Right 07/23/2022   Procedure: CYSTOSCOPY RIGHT URETEROSCOPY, HOLMIUM LASER LITHOTRIPSY, AND RIGHT URETERAL STENT PLACEMENT;  Surgeon: Rene Paci, MD;  Location: [facility];  Service: Urology;  Laterality: Right;  90 MINUTES   EXTRACORPOREAL SHOCK WAVE LITHOTRIPSY  2008   IR LUMBAR DISC ASPIRATION W/IMG GUIDE  08/12/2022     Social History   Tobacco Use   Smoking status: Former    Years: 20    Types: Cigarettes    Quit date: 02/25/2014    Years since quitting: 8.6   Smokeless tobacco: Never  Vaping Use   Vaping Use: Never used  Substance Use Topics   Alcohol use: No   Drug use: No    History reviewed. No pertinent family history.  No Known Allergies  Health Maintenance  Topic Date Due   COVID-19 Vaccine (1) Never done   Hepatitis C Screening  Never done   Zoster Vaccines- Shingrix (1 of 2) Never done   PAP SMEAR-Modifier  Never done   Colonoscopy  Never done   MAMMOGRAM  Never done   INFLUENZA VACCINE  11/27/2022   DTaP/Tdap/Td (2 - Td or Tdap) 09/22/2025   HIV Screening  Completed   HPV VACCINES  Aged Out    Objective: BP (!) 163/98   Pulse 86   Resp 16   Ht 6' (1.829 m)   Wt 120 lb (54.4 kg)   LMP 09/23/2015   SpO2 98%   BMI 16.27 kg/m   Physical Exam Constitutional:      Appearance: Normal appearance. Sitting in a wheel chair  HENT:     Head: Normocephalic and atraumatic.       Mouth: Mucous membranes are moist.  Eyes:    Conjunctiva/sclera: Conjunctivae normal.     Pupils: Pupils are equal, round, and reactive to light.   Cardiovascular:     Rate and Rhythm: Normal rate and regular rhythm.     Heart sounds: No murmur heard. No friction rub. No gallop.   Pulmonary:     Effort: Pulmonary effort is normal.     Breath sounds: Normal breath sounds.   Abdominal:     General: Non distended     Palpations: soft.   Musculoskeletal:        General: Normal range of motion.   Skin:    General: Skin is warm and dry.     Comments:  Neurological:     General:      Mental Status: awake, alert and oriented to person,  place, and time. Ambulatory with support  Psychiatric:        Mood and Affect: Mood normal.   Lab Results Lab Results  Component Value Date   WBC 6.0 09/17/2022   HGB 9.6 (L) 09/17/2022   HCT 33.4 (L) 09/17/2022   MCV 78.4 (L) 09/17/2022   PLT 331 09/17/2022    Lab Results  Component Value Date   CREATININE 0.80 09/17/2022   BUN 26 (H) 09/17/2022   NA 138 09/17/2022   K 3.8 09/17/2022   CL 102 09/17/2022   CO2 25 09/17/2022    Lab Results  Component Value Date   ALT 102 (H) 09/17/2022   AST 82 (H) 09/17/2022   ALKPHOS 204 (H) 09/17/2022   BILITOT 0.4 09/17/2022    No results found for: "CHOL", "HDL", "LDLCALC", "LDLDIRECT", "TRIG", "CHOLHDL" No results found for: "LABRPR", "RPRTITER" No results found for: "HIV1RNAQUANT", "HIV1RNAVL", "CD4TABS"  Radiology  MR Lumbar Spine W Wo Contrast  Result Date: 09/17/2022 CLINICAL DATA:  Low back pain, cauda equina syndrome suspected EXAM: MRI LUMBAR SPINE WITHOUT AND WITH CONTRAST TECHNIQUE: Multiplanar and multiecho pulse sequences of the lumbar spine were obtained without and with intravenous contrast. CONTRAST:  5.67mL GADAVIST GADOBUTROL 1 MMOL/ML IV SOLN COMPARISON:  MRI L Spine 08/10/22 FINDINGS: Segmentation:  Standard. Alignment:  Straightening of the normal lumbar lordosis. Vertebrae:  Redemonstrated abnormal contrast enhancement of the inferior and superior aspect of the L5 and S1 vertebral body as well as the L5-S1 disc space. There is abnormal contrast enhancement in the prevertebral soft tissues and in the epidural space at the L5-S1 level. Compared to 08/10/2022, there is interval increase in disc space loss at L5-S1 with new contrast enhancement in the bilateral facet joints. Somewhat geographic contrast enhancement at the inferior and superior endplates of T11 and T12 is nonspecific and favored to be degenerative, but early discitis osteomyelitis is not excluded. Conus medullaris and cauda equina: Conus extends to the T12-L1 disc space level. Conus and cauda equina appear normal. Paraspinal and other soft tissues: T2 hyperintense lesion in the upper pole of the right kidney is favored to represent a simple renal cyst requiring no further follow-up. Disc levels: Moderate spinal canal narrowing at L1-L2, L2-L3, and L3-L4 secondary to a combination of degenerative disc bulges, ligamentum flavum hypertrophy and congenitally short pedicles. Redemonstrated severe bilateral neural foraminal narrowing at L5-S1. IMPRESSION: 1. Redemonstrated findings of discitis osteomyelitis at L5-S1 with interval increase in disc space loss at this level and new contrast enhancement in the bilateral facet joints. The degree of prevertebral and epidural involvement is unchanged without evidence of an epidural abscess. 2. Somewhat geographic contrast enhancement at the inferior and superior endplates of T11 and T12 likely represents an additional site of discitis osteomyelitis. 3. Moderate spinal canal narrowing at L1-L2, L2-L3, and L3-L4 secondary to a combination of degenerative disc bulges, ligamentum flavum hypertrophy and congenitally short pedicles. 4. Redemonstrated severe bilateral neural foraminal narrowing at L5-S1. Electronically Signed   By: Lorenza Cambridge M.D.   On: 09/17/2022 17:21    Assessment/Plan   71 Y O female with PMH as below including history of kidney stones, status post March admission with pyelonephritis secondary to obstructing stone s/p  cystoscopy with right ureteral stent placement on March 17, discharged on 14 days of cefadroxil ( 3/16 urine culture growing E. Coli) followed by lithotripsy with cystoscopy with right ureteral stent exchange on 3/27 and  # E coli bacteremia 2/2  # L5-S1/ Possible T 11-T12 discitis  osteomyelitis with ventral epidural phlegmon - 4/16 s/p L5-s1 disc aspiration. Cx  E coli  - Repeat blood cx 4/16 NG  - continue IV ceftriaxone until 6/11, then amoxicillin 1g po tid - Fu in a month   # Medication Monitoring - 6/4 CBC and CMP reviewed, elevated ALP, AST and ALT, ESR 120, CRP 31 - denies any abdominal complaints. Transaminitis likely related to abtx and will monitor  # PICC - To be removed after last dose   # Back pain/left thigh pain  - in the setting of above as well as DDD/spinal canal narrowing at Lumbar vertebrae as well severe bilateral neural foraminal narrowing at L5-S1 - Discussed to fu with neurosurgery   I have personally spent 40 minutes involved in face-to-face and non-face-to-face activities for this patient on the day of the visit. Professional time spent includes the following activities: Preparing to see the patient (review of tests), Obtaining and/or reviewing separately obtained history (admission/discharge record), Performing a medically appropriate examination and/or evaluation , Ordering medications/tests/procedures, referring and communicating with other health care professionals, Documenting clinical information in the EMR, Independently interpreting results (not separately reported), Communicating results to the patient/family/caregiver, Counseling and educating the patient/family/caregiver and Care coordination (not separately reported).   Victoriano Lain, MD [facility] 10/02/2022, 1:37 PM

## 2022-10-03 DIAGNOSIS — R7881 Bacteremia: Secondary | ICD-10-CM | POA: Diagnosis not present

## 2022-10-04 DIAGNOSIS — R7881 Bacteremia: Secondary | ICD-10-CM | POA: Diagnosis not present

## 2022-10-05 DIAGNOSIS — R7881 Bacteremia: Secondary | ICD-10-CM | POA: Diagnosis not present

## 2022-10-06 ENCOUNTER — Other Ambulatory Visit (HOSPITAL_COMMUNITY): Payer: Self-pay

## 2022-10-06 DIAGNOSIS — R7881 Bacteremia: Secondary | ICD-10-CM | POA: Diagnosis not present

## 2022-10-06 DIAGNOSIS — B9620 Unspecified Escherichia coli [E. coli] as the cause of diseases classified elsewhere: Secondary | ICD-10-CM | POA: Diagnosis not present

## 2022-10-07 DIAGNOSIS — R7881 Bacteremia: Secondary | ICD-10-CM | POA: Diagnosis not present

## 2022-11-06 ENCOUNTER — Ambulatory Visit (INDEPENDENT_AMBULATORY_CARE_PROVIDER_SITE_OTHER): Payer: BC Managed Care – PPO | Admitting: Infectious Diseases

## 2022-11-06 ENCOUNTER — Other Ambulatory Visit: Payer: Self-pay

## 2022-11-06 ENCOUNTER — Encounter: Payer: Self-pay | Admitting: Infectious Diseases

## 2022-11-06 VITALS — BP 178/97 | HR 89 | Temp 97.4°F | Ht 72.0 in | Wt 140.0 lb

## 2022-11-06 DIAGNOSIS — Z8619 Personal history of other infectious and parasitic diseases: Secondary | ICD-10-CM

## 2022-11-06 DIAGNOSIS — M4647 Discitis, unspecified, lumbosacral region: Secondary | ICD-10-CM

## 2022-11-06 DIAGNOSIS — Z79899 Other long term (current) drug therapy: Secondary | ICD-10-CM | POA: Diagnosis not present

## 2022-11-06 DIAGNOSIS — M545 Low back pain, unspecified: Secondary | ICD-10-CM

## 2022-11-06 DIAGNOSIS — Z452 Encounter for adjustment and management of vascular access device: Secondary | ICD-10-CM | POA: Insufficient documentation

## 2022-11-06 DIAGNOSIS — M4644 Discitis, unspecified, thoracic region: Secondary | ICD-10-CM

## 2022-11-06 DIAGNOSIS — R7401 Elevation of levels of liver transaminase levels: Secondary | ICD-10-CM

## 2022-11-06 NOTE — Progress Notes (Signed)
Patient Active Problem List   Diagnosis Date Noted   Epidural abscess 09/17/2022   Medication management 09/17/2022   Intractable pain 09/17/2022   PICC (peripherally inserted central catheter) in place 09/17/2022   Lung nodule 08/13/2022   Hyponatremia 08/13/2022   Protein-calorie malnutrition, severe (HCC) 08/12/2022   E coli bacteremia 08/11/2022   Discitis of thoracic region 08/11/2022   Hypokalemia 08/10/2022   Discitis of lumbosacral region 08/10/2022   Benign essential HTN 08/10/2022   Transaminitis 07/16/2022   Iron deficiency anemia 07/16/2022   Ureterolithiasis 07/12/2022   Nephrolith 02/10/2017    Patient's Medications  New Prescriptions   No medications on file  Previous Medications   AMLODIPINE (NORVASC) 5 MG TABLET    Take 1 tablet (5 mg total) by mouth daily.   AMOXICILLIN (AMOXIL) 500 MG CAPSULE    Take 2 capsules by mouth 3 times daily.   CEFTRIAXONE (ROCEPHIN) IVPB    Inject into the vein.   FEEDING SUPPLEMENT (ENSURE ENLIVE / ENSURE PLUS) LIQD    Take 237 mLs by mouth 3 (three) times daily between meals.   FERROUS SULFATE 325 (65 FE) MG TABLET    Take 1 tablet (325 mg total) by mouth every other day.   HYDROCHLOROTHIAZIDE (HYDRODIURIL) 12.5 MG TABLET    Take 1 tablet (12.5 mg total) by mouth daily.   METHOCARBAMOL (ROBAXIN) 500 MG TABLET    Take 1 tablet (500 mg total) by mouth every 8 (eight) hours as needed for muscle spasms.   ONDANSETRON (ZOFRAN) 4 MG TABLET    Take 1 tablet (4 mg total) by mouth every 6 (six) hours as needed for nausea.   OXYCODONE-ACETAMINOPHEN (PERCOCET/ROXICET) 5-325 MG TABLET    Take 1 tablet by mouth every 6 hours as needed for severe pain.   POLYETHYLENE GLYCOL POWDER (GLYCOLAX/MIRALAX) 17 GM/SCOOP POWDER    Take 17 g by mouth 3 (three) times daily as needed for mild constipation.   SENNA-DOCUSATE (SENOKOT-S) 8.6-50 MG TABLET    Take 2 tablets by mouth 2 (two) times daily as needed for moderate constipation.  Modified  Medications   No medications on file  Discontinued Medications   No medications on file    Subjective: 62 Y O female with PMH as below including history of kidney stones, pyelonephritis secondary to obstructing stone s/p  cystoscopy with right ureteral stent placement on March 17, discharged on 14 days of cefadroxil ( 3/16 urine culture growing E. Coli) followed by lithotripsy with cystoscopy with right ureteral stent exchange on 3/27 and eventual stent removal who is here for HFU for last admission for E coli bacteremia/L5-s1/t11-t12 discitis and Osteomyelitis. She underwent  L5-s1 disc aspiration 4/16.  Cx  E coli. Discharged on 08/18/22 to complete 6 weeks of IV ceftriaxone, EOT 09/23/22.   09/17/22 sent to ED for severe back pain as well as shooting pain in her left lateral thigh. MRI L spine findings as below.   Completed IV ceftriaxone on 611/24. PICC removed.   11/06/22 Accompanied by niece. Back pain is 5-6/10, gets little worse when moving around a lot. She is able to walk by herself. Pain in her left thigh has resolved. She is not using wheelchair anymore. She has not seen neurosurgery as they did not take her insurance. Taking amoxicillin as instructed, had GI upset in the beginning and now has started taking with food and feels better. No complaints otherwise.   Review of Systems: all systems reviewed with pertinent positives and  negatives as listed above.   Past Medical History:  Diagnosis Date   Constipation    History of acute pyelonephritis 07/12/2022   secondary to uropathy obstruction due to kidney stones   History of kidney stones    Microcytic anemia 07/12/2022   Nephrolithiasis    left side calculi non-obstructive per CT 07-18-2022   Ureteral calculi 07/12/2022   right side   Wears contact lenses    Past Surgical History:  Procedure Laterality Date   APPENDECTOMY  age 35   CYSTOSCOPY WITH STENT PLACEMENT Right 02/10/2017   Procedure: CYSTOSCOPY WITH RIGHT STENT  PLACEMENT;  Surgeon: Rene Paci, MD;  Location: [facility];  Service: Urology;  Laterality: Right;   CYSTOSCOPY/URETEROSCOPY/HOLMIUM LASER/STENT PLACEMENT Right 03/04/2017   Procedure: CYSTOSCOPY/URETEROSCOPY/HOLMIUM LASER/STENT PLACEMENT;  Surgeon: Rene Paci, MD;  Location[facility];  Service: Urology;  Laterality: Right;  NEEDS 30 MIN FOR PROCEDURE   CYSTOSCOPY/URETEROSCOPY/HOLMIUM LASER/STENT PLACEMENT Right 07/13/2022   Procedure: CYSTOSCOPY RIGHT URETERAL STENT PLACEMENT;  Surgeon: Crista Elliot, MD;  Location: [facility];  Service: Urology;  Laterality: Right;   CYSTOSCOPY/URETEROSCOPY/HOLMIUM LASER/STENT PLACEMENT Right 07/23/2022   Procedure: CYSTOSCOPY RIGHT URETEROSCOPY, HOLMIUM LASER LITHOTRIPSY, AND RIGHT URETERAL STENT PLACEMENT;  Surgeon: Rene Paci, MD;  Location: [facility];  Service: Urology;  Laterality: Right;  90 MINUTES   EXTRACORPOREAL SHOCK WAVE LITHOTRIPSY  2008   IR LUMBAR DISC ASPIRATION W/IMG GUIDE  08/12/2022     Social History   Tobacco Use   Smoking status: Former    Current packs/day: 0.00    Types: Cigarettes    Start date: 02/25/1994    Quit date: 02/25/2014    Years since quitting: 8.7   Smokeless tobacco: Never  Vaping Use   Vaping status: Never Used  Substance Use Topics   Alcohol use: No   Drug use: No    No family history on file.  No Known Allergies  Health Maintenance  Topic Date Due   COVID-19 Vaccine (1) Never done   Hepatitis C Screening  Never done   Zoster Vaccines- Shingrix (1 of 2) Never done   PAP SMEAR-Modifier  Never done   Colonoscopy  Never done   MAMMOGRAM  Never done   INFLUENZA VACCINE  11/27/2022   DTaP/Tdap/Td (2 - Td or Tdap) 09/22/2025   HIV Screening  Completed   HPV VACCINES  Aged Out    Objective: Ht 6' (1.829 m)   Wt 140 lb (63.5 kg)   LMP 09/23/2015   BMI 18.99 kg/m   Physical Exam Constitutional:      Appearance: Normal  appearance. Sitting in a wheel chair  HENT:     Head: Normocephalic and atraumatic.      Mouth: Mucous membranes are moist.  Eyes:    Conjunctiva/sclera: Conjunctivae normal.     Pupils: Pupils are equal, round, and reactive to light.   Cardiovascular:     Rate and Rhythm: Normal rate and regular rhythm.     Heart sounds: No murmur heard. No friction rub. No gallop.   Pulmonary:     Effort: Pulmonary effort is normal.     Breath sounds: Normal breath sounds.   Abdominal:     General: Non distended     Palpations: soft.   Musculoskeletal:        General: Normal range of motion.   Skin:    General: Skin is warm and dry.     Comments:  Neurological:  General:      Mental Status: awake, alert and oriented to person, place, and time. Ambulatory without support.  Psychiatric:        Mood and Affect: Mood normal.   Lab Results Lab Results  Component Value Date   WBC 6.0 09/17/2022   HGB 9.6 (L) 09/17/2022   HCT 33.4 (L) 09/17/2022   MCV 78.4 (L) 09/17/2022   PLT 331 09/17/2022    Lab Results  Component Value Date   CREATININE 0.80 09/17/2022   BUN 26 (H) 09/17/2022   NA 138 09/17/2022   K 3.8 09/17/2022   CL 102 09/17/2022   CO2 25 09/17/2022    Lab Results  Component Value Date   ALT 102 (H) 09/17/2022   AST 82 (H) 09/17/2022   ALKPHOS 204 (H) 09/17/2022   BILITOT 0.4 09/17/2022    No results found for: "CHOL", "HDL", "LDLCALC", "LDLDIRECT", "TRIG", "CHOLHDL" No results found for: "LABRPR", "RPRTITER" No results found for: "HIV1RNAQUANT", "HIV1RNAVL", "CD4TABS"  Radiology  No results found.  Assessment/Plan  21 Y O female with PMH as below including history of kidney stones, status post March admission with pyelonephritis secondary to obstructing stone s/p  cystoscopy with right ureteral stent placement on March 17, discharged on 14 days of cefadroxil ( 3/16 urine culture growing E. Coli) followed by lithotripsy with cystoscopy with right ureteral stent  exchange on 3/27 and eventual stent removal admitted with  # E coli bacteremia 2/2  # L5-S1/ Possible T 11-T12 discitis osteomyelitis with ventral epidural phlegmon - 4/16 s/p L5-s1 disc aspiration. Cx  E coli  - Repeat blood cx 4/16 NG  - completed 6 weeks of IV ceftriaxone 6/11 after which started on PO amoxicillin 1g po tid - current   Plan Continue PO amoxicillin as still has elevated inflammatory markers, has adequate refills  Labs today  Fu in a month   # Medication Monitoring 6/10 CBC and CMP noted, elevated liver enzymes ( ALP 360, AST165, ALT182 , likely in the setting of ceftriaxone which has been stopped, labs today as above, No GI complaints   # Low Back pain Improving   I have personally spent 40 minutes involved in face-to-face and non-face-to-face activities for this patient on the day of the visit. Professional time spent includes the following activities: Preparing to see the patient (review of tests), Obtaining and/or reviewing separately obtained history (admission/discharge record), Performing a medically appropriate examination and/or evaluation , Ordering medications/tests/procedures, referring and communicating with other health care professionals, Documenting clinical information in the EMR, Independently interpreting results (not separately reported), Communicating results to the patient/family/caregiver, Counseling and educating the patient/family/caregiver and Care coordination (not separately reported).   Victoriano Lain, MD [facility] 11/06/2022, 1:37 PM

## 2022-11-07 ENCOUNTER — Other Ambulatory Visit (HOSPITAL_COMMUNITY): Payer: Self-pay

## 2022-11-07 LAB — COMPREHENSIVE METABOLIC PANEL
AG Ratio: 1 (calc) (ref 1.0–2.5)
ALT: 38 U/L — ABNORMAL HIGH (ref 6–29)
AST: 34 U/L (ref 10–35)
Albumin: 3.8 g/dL (ref 3.6–5.1)
Alkaline phosphatase (APISO): 143 U/L (ref 37–153)
BUN: 24 mg/dL (ref 7–25)
CO2: 25 mmol/L (ref 20–32)
Calcium: 9.5 mg/dL (ref 8.6–10.4)
Chloride: 108 mmol/L (ref 98–110)
Creat: 0.91 mg/dL (ref 0.50–1.05)
Globulin: 3.7 g/dL (calc) (ref 1.9–3.7)
Glucose, Bld: 93 mg/dL (ref 65–99)
Potassium: 3.8 mmol/L (ref 3.5–5.3)
Sodium: 143 mmol/L (ref 135–146)
Total Bilirubin: 0.3 mg/dL (ref 0.2–1.2)
Total Protein: 7.5 g/dL (ref 6.1–8.1)

## 2022-11-07 LAB — CBC
HCT: 38.5 % (ref 35.0–45.0)
Hemoglobin: 11.4 g/dL — ABNORMAL LOW (ref 11.7–15.5)
MCH: 23.1 pg — ABNORMAL LOW (ref 27.0–33.0)
MCHC: 29.6 g/dL — ABNORMAL LOW (ref 32.0–36.0)
MCV: 77.9 fL — ABNORMAL LOW (ref 80.0–100.0)
MPV: 10.3 fL (ref 7.5–12.5)
Platelets: 252 10*3/uL (ref 140–400)
RBC: 4.94 10*6/uL (ref 3.80–5.10)
RDW: 16.8 % — ABNORMAL HIGH (ref 11.0–15.0)
WBC: 4.2 10*3/uL (ref 3.8–10.8)

## 2022-11-07 LAB — C-REACTIVE PROTEIN: CRP: <3 mg/L (ref <8.0)

## 2022-11-07 LAB — SEDIMENTATION RATE: Sed Rate: 25 mm/h (ref 0–30)

## 2022-11-12 ENCOUNTER — Other Ambulatory Visit (HOSPITAL_COMMUNITY): Payer: Self-pay

## 2022-12-11 ENCOUNTER — Other Ambulatory Visit: Payer: Self-pay

## 2022-12-11 ENCOUNTER — Encounter: Payer: Self-pay | Admitting: Infectious Diseases

## 2022-12-11 ENCOUNTER — Ambulatory Visit (INDEPENDENT_AMBULATORY_CARE_PROVIDER_SITE_OTHER): Payer: Self-pay | Admitting: Infectious Diseases

## 2022-12-11 VITALS — BP 156/80 | HR 57 | Resp 16 | Ht 72.0 in | Wt 148.0 lb

## 2022-12-11 DIAGNOSIS — Z79899 Other long term (current) drug therapy: Secondary | ICD-10-CM

## 2022-12-11 DIAGNOSIS — M4647 Discitis, unspecified, lumbosacral region: Secondary | ICD-10-CM

## 2022-12-11 DIAGNOSIS — I10 Essential (primary) hypertension: Secondary | ICD-10-CM | POA: Insufficient documentation

## 2022-12-11 DIAGNOSIS — Z7689 Persons encountering health services in other specified circumstances: Secondary | ICD-10-CM

## 2022-12-11 NOTE — Progress Notes (Signed)
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   Medication management 09/17/2022   Intractable pain 09/17/2022   Lung nodule 08/13/2022   Hyponatremia 08/13/2022   Protein-calorie malnutrition, severe (HCC) 08/12/2022   E coli bacteremia 08/11/2022   Discitis of thoracic region 08/11/2022   Hypokalemia 08/10/2022   Discitis of lumbosacral region 08/10/2022   Benign essential HTN 08/10/2022   Transaminitis 07/16/2022   Iron deficiency anemia 07/16/2022   Ureterolithiasis 07/12/2022   Nephrolith 02/10/2017    Patient's Medications  New Prescriptions   No medications on file  Previous Medications   AMLODIPINE (NORVASC) 5 MG TABLET    Take 1 tablet (5 mg total) by mouth daily.   AMOXICILLIN (AMOXIL) 500 MG CAPSULE    Take 2 capsules by mouth 3 times daily.   CEFTRIAXONE (ROCEPHIN) IVPB    Inject into the vein.   FEEDING SUPPLEMENT (ENSURE ENLIVE / ENSURE PLUS) LIQD    Take 237 mLs by mouth 3 (three) times daily between meals.   FERROUS SULFATE 325 (65 FE) MG TABLET    Take 1 tablet (325 mg total) by mouth every other day.   HYDROCHLOROTHIAZIDE (HYDRODIURIL) 12.5 MG TABLET    Take 1 tablet (12.5 mg total) by mouth daily.   METHOCARBAMOL (ROBAXIN) 500 MG TABLET    Take 1 tablet (500 mg total) by mouth every 8 (eight) hours as needed for muscle spasms.   ONDANSETRON (ZOFRAN) 4 MG TABLET    Take 1 tablet (4 mg total) by mouth every 6 (six) hours as needed for nausea.   OXYCODONE-ACETAMINOPHEN (PERCOCET/ROXICET) 5-325 MG TABLET    Take 1 tablet by mouth every 6 hours as needed for severe pain.   POLYETHYLENE GLYCOL POWDER (GLYCOLAX/MIRALAX) 17 GM/SCOOP POWDER    Take 17 g by mouth 3 (three) times daily as needed for mild constipation.   SENNA-DOCUSATE (SENOKOT-S) 8.6-50 MG TABLET    Take 2 tablets by mouth 2 (two) times daily as needed for moderate constipation.  Modified Medications   No medications on file   Discontinued Medications   No medications on file    Subjective: 62 Y O female with PMH as below including history of kidney stones, pyelonephritis secondary to obstructing stone s/p  cystoscopy with right ureteral stent placement on March 17, discharged on 14 days of cefadroxil ( 3/16 urine culture growing E. Coli) followed by lithotripsy with cystoscopy with right ureteral stent exchange on 3/27 and eventual stent removal who is here for HFU for last admission for E coli bacteremia/L5-s1/t11-t12 discitis and Osteomyelitis. She underwent  L5-s1 disc aspiration 4/16.  Cx  E coli. Discharged on 08/18/22 to complete 6 weeks of IV ceftriaxone, EOT 09/23/22.   09/17/22 sent to ED for severe back pain as well as shooting pain in her left lateral thigh. MRI L spine findings as below.   Completed IV ceftriaxone on 611/24. PICC removed. She was started on PO amoxicillin after IV course. She is clinically improving in terms of back pain, thigh pain.   12/11/22 She has been taking PO amoxicillin as instructed without any concerns and 2 more days left.Back pain is resolved, Leg pain is resolved. She does not have PCP and willing to see a PCP for HTN management when discussed about elevated BP reading. Denies chest pain, SOB or headache. She wants to go back to work and asking for return to work Physicist, medical. No other complaints.  Review of Systems: all systems reviewed with pertinent positives and negatives as listed above.   Past Medical History:  Diagnosis Date   Constipation    History of acute pyelonephritis 07/12/2022   secondary to uropathy obstruction due to kidney stones   History of kidney stones    Microcytic anemia 07/12/2022   Nephrolithiasis    left side calculi non-obstructive per CT 07-18-2022   Ureteral calculi 07/12/2022   right side   Wears contact lenses    Past Surgical History:  Procedure Laterality Date   APPENDECTOMY  age 42   CYSTOSCOPY WITH STENT PLACEMENT Right 02/10/2017    Procedure: CYSTOSCOPY WITH RIGHT STENT PLACEMENT;  Surgeon: Rene Paci, MD;  Location: [facility];  Service: Urology;  Laterality: Right;   CYSTOSCOPY/URETEROSCOPY/HOLMIUM LASER/STENT PLACEMENT Right 03/04/2017   Procedure: CYSTOSCOPY/URETEROSCOPY/HOLMIUM LASER/STENT PLACEMENT;  Surgeon: Rene Paci, MD;  [facility];  Service: Urology;  Laterality: Right;  NEEDS 30 MIN FOR PROCEDURE   CYSTOSCOPY/URETEROSCOPY/HOLMIUM LASER/STENT PLACEMENT Right 07/13/2022   Procedure: CYSTOSCOPY RIGHT URETERAL STENT PLACEMENT;  Surgeon: Crista Elliot, MD;  Location: [facility];  Service: Urology;  Laterality: Right;   CYSTOSCOPY/URETEROSCOPY/HOLMIUM LASER/STENT PLACEMENT Right 07/23/2022   Procedure: CYSTOSCOPY RIGHT URETEROSCOPY, HOLMIUM LASER LITHOTRIPSY, AND RIGHT URETERAL STENT PLACEMENT;  Surgeon: Rene Paci, MD;  Location[facility];  Service: Urology;  Laterality: Right;  90 MINUTES   EXTRACORPOREAL SHOCK WAVE LITHOTRIPSY  2008   IR LUMBAR DISC ASPIRATION W/IMG GUIDE  08/12/2022     Social History   Tobacco Use   Smoking status: Former    Current packs/day: 0.00    Types: Cigarettes    Start date: 02/25/1994    Quit date: 02/25/2014    Years since quitting: 8.7   Smokeless tobacco: Never  Vaping Use   Vaping status: Never Used  Substance Use Topics   Alcohol use: No   Drug use: No    No family history on file.  No Known Allergies  Health Maintenance  Topic Date Due   COVID-19 Vaccine (1) Never done   Hepatitis C Screening  Never done   Zoster Vaccines- Shingrix (1 of 2) Never done   PAP SMEAR-Modifier  Never done   Colonoscopy  Never done   MAMMOGRAM  Never done   INFLUENZA VACCINE  11/27/2022   DTaP/Tdap/Td (2 - Td or Tdap) 09/22/2025   HIV Screening  Completed   HPV VACCINES  Aged Out    Objective: BP (!) 156/80   Pulse (!) 57   Resp 16   Ht 6' (1.829 m)   Wt 148 lb (67.1 kg)   LMP 09/23/2015    SpO2 98%   BMI 20.07 kg/m    Physical Exam Constitutional:      Appearance: Normal appearance. Sitting in a chair  HENT:     Head: Normocephalic and atraumatic.      Mouth: Mucous membranes are moist.  Eyes:    Conjunctiva/sclera: Conjunctivae normal.     Pupils: Pupils are equal, round, and bilaterally symmetrical   Cardiovascular:     Rate and Rhythm: Normal rate and regular rhythm.     Heart sounds:  Pulmonary:     Effort: Pulmonary effort is normal.     Breath sounds:   Abdominal:     General: Non distended     Palpations:   Musculoskeletal:        General: Normal range of motion. Ambulatory without support  Skin:  General: Skin is warm and dry.     Comments:  Neurological:     General:      Mental Status: awake, alert and oriented to person, place, and time.   Psychiatric:        Mood and Affect: Mood normal.   Lab Results Lab Results  Component Value Date   WBC 4.2 11/06/2022   HGB 11.4 (L) 11/06/2022   HCT 38.5 11/06/2022   MCV 77.9 (L) 11/06/2022   PLT 252 11/06/2022    Lab Results  Component Value Date   CREATININE 0.91 11/06/2022   BUN 24 11/06/2022   NA 143 11/06/2022   K 3.8 11/06/2022   CL 108 11/06/2022   CO2 25 11/06/2022    Lab Results  Component Value Date   ALT 38 (H) 11/06/2022   AST 34 11/06/2022   ALKPHOS 204 (H) 09/17/2022   BILITOT 0.3 11/06/2022    No results found for: "CHOL", "HDL", "LDLCALC", "LDLDIRECT", "TRIG", "CHOLHDL" No results found for: "LABRPR", "RPRTITER" No results found for: "HIV1RNAQUANT", "HIV1RNAVL", "CD4TABS"  Radiology  No results found.  Assessment/Plan  89 Y O female with PMH as below including history of kidney stones, status post March admission with pyelonephritis secondary to obstructing stone s/p  cystoscopy with right ureteral stent placement on March 17, discharged on 14 days of cefadroxil ( 3/16 urine culture growing E. 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 Cx  E coli  - Repeat blood cx 4/16 NG  - completed 6 weeks of IV ceftriaxone 6/11 after which started on PO amoxicillin 1g po tid - current   Plan DC abtx  Fu with [place] as needed  # Medication Monitoring - No labs as stopping abtx  # Low Back pain Improved   # Elevated BP reading/HTN - discussed to Fu with PCP for BP management  # Letter  - Letter provided that she is OK to return back to work   I have personally spent 40 minutes involved in face-to-face and non-face-to-face activities for this patient on the day of the visit. Professional time spent includes the following activities: Preparing to see the patient (review of tests), Obtaining and/or reviewing separately obtained history (admission/discharge record), Performing a medically appropriate examination and/or evaluation , Ordering medications/tests/procedures, referring and communicating with other health care professionals, Documenting clinical information in the EMR, Independently interpreting results (not separately reported), Communicating results to the patient/family/caregiver, Counseling and educating the patient/family/caregiver and Care coordination (not separately reported).   Victoriano Lain, MD [facility] 12/11/2022, 1:34 PM

## 2022-12-12 ENCOUNTER — Other Ambulatory Visit: Payer: Self-pay

## 2022-12-12 ENCOUNTER — Other Ambulatory Visit (HOSPITAL_COMMUNITY): Payer: Self-pay

## 2023-01-11 ENCOUNTER — Other Ambulatory Visit: Payer: Self-pay

## 2023-02-13 ENCOUNTER — Other Ambulatory Visit (HOSPITAL_COMMUNITY): Payer: Self-pay

## 2024-03-16 ENCOUNTER — Encounter (HOSPITAL_COMMUNITY): Payer: Self-pay | Admitting: Emergency Medicine

## 2024-03-16 ENCOUNTER — Inpatient Hospital Stay (HOSPITAL_COMMUNITY)
Admission: EM | Admit: 2024-03-16 | Discharge: 2024-03-30 | DRG: 330 | Disposition: A | Attending: Internal Medicine | Admitting: Internal Medicine

## 2024-03-16 ENCOUNTER — Emergency Department (HOSPITAL_COMMUNITY)

## 2024-03-16 ENCOUNTER — Other Ambulatory Visit: Payer: Self-pay

## 2024-03-16 DIAGNOSIS — K56609 Unspecified intestinal obstruction, unspecified as to partial versus complete obstruction: Secondary | ICD-10-CM | POA: Insufficient documentation

## 2024-03-16 DIAGNOSIS — I471 Supraventricular tachycardia, unspecified: Principal | ICD-10-CM | POA: Insufficient documentation

## 2024-03-16 DIAGNOSIS — K469 Unspecified abdominal hernia without obstruction or gangrene: Secondary | ICD-10-CM

## 2024-03-16 DIAGNOSIS — I10 Essential (primary) hypertension: Secondary | ICD-10-CM | POA: Diagnosis present

## 2024-03-16 DIAGNOSIS — K460 Unspecified abdominal hernia with obstruction, without gangrene: Secondary | ICD-10-CM | POA: Diagnosis present

## 2024-03-16 DIAGNOSIS — Z8739 Personal history of other diseases of the musculoskeletal system and connective tissue: Secondary | ICD-10-CM

## 2024-03-16 LAB — COMPREHENSIVE METABOLIC PANEL WITH GFR
ALT: 52 U/L — ABNORMAL HIGH (ref 0–44)
AST: 60 U/L — ABNORMAL HIGH (ref 15–41)
Albumin: 3.8 g/dL (ref 3.5–5.0)
Alkaline Phosphatase: 119 U/L (ref 38–126)
Anion gap: 20 — ABNORMAL HIGH (ref 5–15)
BUN: 50 mg/dL — ABNORMAL HIGH (ref 8–23)
CO2: 18 mmol/L — ABNORMAL LOW (ref 22–32)
Calcium: 9.6 mg/dL (ref 8.9–10.3)
Chloride: 99 mmol/L (ref 98–111)
Creatinine, Ser: 1.31 mg/dL — ABNORMAL HIGH (ref 0.44–1.00)
GFR, Estimated: 46 mL/min — ABNORMAL LOW (ref >60)
Glucose, Bld: 130 mg/dL — ABNORMAL HIGH (ref 70–99)
Potassium: 3.5 mmol/L (ref 3.5–5.1)
Sodium: 137 mmol/L (ref 135–145)
Total Bilirubin: 2.1 mg/dL — ABNORMAL HIGH (ref 0.0–1.2)
Total Protein: 7.9 g/dL (ref 6.5–8.1)

## 2024-03-16 LAB — CBC
HCT: 48.2 % — ABNORMAL HIGH (ref 36.0–46.0)
Hemoglobin: 15.2 g/dL — ABNORMAL HIGH (ref 12.0–15.0)
MCH: 24.9 pg — ABNORMAL LOW (ref 26.0–34.0)
MCHC: 31.5 g/dL (ref 30.0–36.0)
MCV: 79 fL — ABNORMAL LOW (ref 80.0–100.0)
Platelets: 245 K/uL (ref 150–400)
RBC: 6.1 MIL/uL — ABNORMAL HIGH (ref 3.87–5.11)
RDW: 14 % (ref 11.5–15.5)
WBC: 7.3 K/uL (ref 4.0–10.5)
nRBC: 0 % (ref 0.0–0.2)

## 2024-03-16 LAB — TROPONIN I (HIGH SENSITIVITY): Troponin I (High Sensitivity): 88 ng/L — ABNORMAL HIGH (ref <18)

## 2024-03-16 LAB — I-STAT CHEM 8, ED
BUN: 52 mg/dL — ABNORMAL HIGH (ref 8–23)
Calcium, Ion: 1.09 mmol/L — ABNORMAL LOW (ref 1.15–1.40)
Chloride: 103 mmol/L (ref 98–111)
Creatinine, Ser: 1.5 mg/dL — ABNORMAL HIGH (ref 0.44–1.00)
Glucose, Bld: 134 mg/dL — ABNORMAL HIGH (ref 70–99)
HCT: 53 % — ABNORMAL HIGH (ref 36.0–46.0)
Hemoglobin: 18 g/dL — ABNORMAL HIGH (ref 12.0–15.0)
Potassium: 3.5 mmol/L (ref 3.5–5.1)
Sodium: 138 mmol/L (ref 135–145)
TCO2: 22 mmol/L (ref 22–32)

## 2024-03-16 LAB — I-STAT CG4 LACTIC ACID, ED: Lactic Acid, Venous: 1.8 mmol/L (ref 0.5–1.9)

## 2024-03-16 LAB — LIPASE, BLOOD: Lipase: 34 U/L (ref 11–51)

## 2024-03-16 LAB — TSH: TSH: 3.054 u[IU]/mL (ref 0.350–4.500)

## 2024-03-16 MED ORDER — ONDANSETRON HCL 4 MG/2ML IJ SOLN
4.0000 mg | Freq: Once | INTRAMUSCULAR | Status: AC
  Administered 2024-03-16: 4 mg via INTRAVENOUS
  Filled 2024-03-16: qty 2

## 2024-03-16 MED ORDER — ADENOSINE 6 MG/2ML IV SOLN
12.0000 mg | Freq: Once | INTRAVENOUS | Status: AC
  Administered 2024-03-16: 12 mg via INTRAVENOUS

## 2024-03-16 MED ORDER — MORPHINE SULFATE (PF) 4 MG/ML IV SOLN
4.0000 mg | Freq: Once | INTRAVENOUS | Status: AC
  Administered 2024-03-16: 4 mg via INTRAVENOUS
  Filled 2024-03-16: qty 1

## 2024-03-16 MED ORDER — SODIUM CHLORIDE 0.9 % IV BOLUS
1000.0000 mL | Freq: Once | INTRAVENOUS | Status: AC
  Administered 2024-03-16: 1000 mL via INTRAVENOUS

## 2024-03-16 MED ORDER — IOHEXOL 350 MG/ML SOLN
75.0000 mL | Freq: Once | INTRAVENOUS | Status: AC | PRN
  Administered 2024-03-16: 75 mL via INTRAVENOUS

## 2024-03-16 MED ORDER — ADENOSINE 6 MG/2ML IV SOLN
6.0000 mg | Freq: Once | INTRAVENOUS | Status: AC
  Administered 2024-03-16: 6 mg via INTRAVENOUS

## 2024-03-16 MED ORDER — METOPROLOL TARTRATE 5 MG/5ML IV SOLN
5.0000 mg | Freq: Once | INTRAVENOUS | Status: AC
  Administered 2024-03-16: 5 mg via INTRAVENOUS
  Filled 2024-03-16: qty 5

## 2024-03-16 NOTE — ED Notes (Signed)
 Patient transported to CT scan .

## 2024-03-16 NOTE — ED Notes (Signed)
 Patient converted to NSR after 2 doses of Adenosine  ( 6/12).

## 2024-03-16 NOTE — ED Notes (Signed)
 CCMD notified that patient is on a monitor .

## 2024-03-16 NOTE — ED Triage Notes (Signed)
 Patient from home, noted to have a hard knot to her lower R abdomen. She noticed this around noon time. Endorses n/v/. Denies chest pain, shortness of breath. Denies fevers, chills.

## 2024-03-16 NOTE — ED Provider Notes (Signed)
 Emergency Department Provider Note   I have reviewed the triage vital signs and the nursing notes.   HISTORY  Chief Complaint Abdominal Pain   HPI Denise Hudson is a 63 y.o. female with past history of pyelonephritis and prior appendectomy presents to the emergency department with right lower quadrant abdominal pain.  She reports severe abdominal pain in the right lower abdomen with vomiting.  No diarrhea.  No chest pain, shortness of breath, syncope, palpitation symptoms.  No fevers or chills.  She arrives to triage by private vehicle with heart rate initially in the 190 range but was not aware of this.  No history of tachycardias in the past.  Denies any drug or alcohol use.   Past Medical History:  Diagnosis Date   Constipation    History of acute pyelonephritis 07/12/2022   secondary to uropathy obstruction due to kidney stones   History of kidney stones    Microcytic anemia 07/12/2022   Nephrolithiasis    left side calculi non-obstructive per CT 07-18-2022   Ureteral calculi 07/12/2022   right side   Wears contact lenses     Review of Systems  Constitutional: No fever/chills Cardiovascular: Denies chest pain. Respiratory: Denies shortness of breath. Gastrointestinal: Positive RLQ abdominal pain. Positive nausea and vomiting.  No diarrhea.  No constipation. Skin: Negative for rash. Neurological: Negative for headaches. ____________________________________________   PHYSICAL EXAM:  VITAL SIGNS: ED Triage Vitals  Encounter Vitals Group     BP 03/16/24 2156 (!) 156/117     Pulse Rate 03/16/24 2156 (!) 198     Resp 03/16/24 2156 18     Temp 03/16/24 2156 97.9 F (36.6 C)     Temp src --      SpO2 03/16/24 2156 100 %     Weight 03/16/24 2203 147 lb 11.3 oz (67 kg)     Height 03/16/24 2203 6' 1 (1.854 m)   Constitutional: Alert and oriented. Well appearing and in no acute distress. Eyes: Conjunctivae are normal.  Head: Atraumatic. Nose: No  congestion/rhinnorhea. Mouth/Throat: Mucous membranes are moist.  Neck: No stridor.  Cardiovascular: Tachycardia. Good peripheral circulation. Grossly normal heart sounds.   Respiratory: Normal respiratory effort.  No retractions. Lungs CTAB. Gastrointestinal: Soft with focal tenderness and focal swelling in the right anterior abdomen. No distention.  Musculoskeletal: No gross deformities of extremities. Neurologic:  Normal speech and language.  Skin:  Skin is warm, dry and intact. No rash noted.   ____________________________________________   LABS (all labs ordered are listed, but only abnormal results are displayed)  Labs Reviewed  COMPREHENSIVE METABOLIC PANEL WITH GFR - Abnormal; Notable for the following components:      Result Value   CO2 18 (*)    Glucose, Bld 130 (*)    BUN 50 (*)    Creatinine, Ser 1.31 (*)    AST 60 (*)    ALT 52 (*)    Total Bilirubin 2.1 (*)    GFR, Estimated 46 (*)    Anion gap 20 (*)    All other components within normal limits  CBC - Abnormal; Notable for the following components:   RBC 6.10 (*)    Hemoglobin 15.2 (*)    HCT 48.2 (*)    MCV 79.0 (*)    MCH 24.9 (*)    All other components within normal limits  URINALYSIS, ROUTINE W REFLEX MICROSCOPIC - Abnormal; Notable for the following components:   APPearance HAZY (*)    Specific Gravity,  Urine 1.040 (*)    Hgb urine dipstick SMALL (*)    Protein, ur 30 (*)    Leukocytes,Ua LARGE (*)    Bacteria, UA RARE (*)    All other components within normal limits  CBC - Abnormal; Notable for the following components:   RBC 5.49 (*)    MCH 25.0 (*)    All other components within normal limits  COMPREHENSIVE METABOLIC PANEL WITH GFR - Abnormal; Notable for the following components:   Potassium 3.4 (*)    Glucose, Bld 113 (*)    BUN 45 (*)    Creatinine, Ser 1.18 (*)    Calcium 8.4 (*)    Albumin  3.2 (*)    AST 50 (*)    Total Bilirubin 1.7 (*)    GFR, Estimated 53 (*)    All other  components within normal limits  I-STAT CHEM 8, ED - Abnormal; Notable for the following components:   BUN 52 (*)    Creatinine, Ser 1.50 (*)    Glucose, Bld 134 (*)    Calcium, Ion 1.09 (*)    Hemoglobin 18.0 (*)    HCT 53.0 (*)    All other components within normal limits  I-STAT CG4 LACTIC ACID, ED - Abnormal; Notable for the following components:   Lactic Acid, Venous 2.1 (*)    All other components within normal limits  TROPONIN I (HIGH SENSITIVITY) - Abnormal; Notable for the following components:   Troponin I (High Sensitivity) 88 (*)    All other components within normal limits  TROPONIN I (HIGH SENSITIVITY) - Abnormal; Notable for the following components:   Troponin I (High Sensitivity) 95 (*)    All other components within normal limits  CULTURE, BLOOD (ROUTINE X 2)  CULTURE, BLOOD (ROUTINE X 2)  LIPASE, BLOOD  TSH  HIV ANTIBODY (ROUTINE TESTING W REFLEX)  RAPID URINE DRUG SCREEN, HOSP PERFORMED  CBC  COMPREHENSIVE METABOLIC PANEL WITH GFR  MAGNESIUM   PHOSPHORUS  I-STAT CG4 LACTIC ACID, ED  SURGICAL PATHOLOGY   ____________________________________________  EKG   EKG Interpretation Date/Time:  Wednesday March 16 2024 22:26:39 EST Ventricular Rate:  100 PR Interval:  194 QRS Duration:  83 QT Interval:  356 QTC Calculation: 460 R Axis:   50  Text Interpretation: Sinus tachycardia Ventricular premature complex Consider right atrial enlargement Abnormal R-wave progression, early transition Consider left ventricular hypertrophy Confirmed by Darra Chew [phone]) on 03/16/2024 10:34:30 PM        ____________________________________________   PROCEDURES  Procedure(s) performed:   .Cardioversion  Date/Time: 03/16/2024 10:44 PM  Performed by: Darra Chew MATSU, MD Authorized by: Darra Chew MATSU, MD   Consent:    Consent obtained:  Verbal   Consent given by:  Patient   Risks discussed:  Death, induced arrhythmia and pain   Alternatives discussed:   Rate-control medication Pre-procedure details:    Cardioversion basis:  Emergent   Rhythm:  Supraventricular tachycardia   Electrode placement:  Anterior-posterior Patient sedated: No Attempt one:    Cardioversion mode attempt one: adenosine  6 mg.   Shock outcome:  No change in rhythm Attempt two:    Cardioversion mode attempt two: adenosine  12 mg.   Shock outcome:  Conversion to normal sinus rhythm Post-procedure details:    Patient status:  Awake   Patient tolerance of procedure:  Tolerated well, no immediate complications .Critical Care  Performed by: Darra Chew MATSU, MD Authorized by: Darra Chew MATSU, MD   Critical care provider statement:  Critical care time (minutes):  45   Critical care time was exclusive of:  Separately billable procedures and treating other patients and teaching time   Critical care was necessary to treat or prevent imminent or life-threatening deterioration of the following conditions:  Circulatory failure   Critical care was time spent personally by me on the following activities:  Development of treatment plan with patient or surrogate, discussions with consultants, evaluation of patient's response to treatment, examination of patient, ordering and review of laboratory studies, ordering and review of radiographic studies, ordering and performing treatments and interventions, pulse oximetry, re-evaluation of patient's condition, review of old charts and obtaining history from patient or surrogate   I assumed direction of critical care for this patient from another provider in my specialty: no      ____________________________________________   INITIAL IMPRESSION / ASSESSMENT AND PLAN / ED COURSE  Pertinent labs & imaging results that were available during my care of the patient were reviewed by me and considered in my medical decision making (see chart for details).   This patient is Presenting for Evaluation of abdominal pain, which does require a range  of treatment options, and is a complaint that involves a high risk of morbidity and mortality.  The Differential Diagnoses includes but is not exclusive to acute cholecystitis, intrathoracic causes for epigastric abdominal pain, gastritis, duodenitis, pancreatitis, small bowel or large bowel obstruction, abdominal aortic aneurysm, hernia, gastritis, etc.   Critical Interventions-    Medications  piperacillin -tazobactam (ZOSYN ) IVPB 3.375 g (3.375 g Intravenous New Bag/Given 03/18/24 2132)  dextrose  5 % in lactated ringers  infusion ( Intravenous Rate/Dose Change 03/17/24 0137)  sodium chloride  flush (NS) 0.9 % injection 3 mL (3 mLs Intravenous Not Given 03/18/24 2116)  sodium chloride  flush (NS) 0.9 % injection 3 mL ( Intravenous MAR Unhold 03/17/24 1108)  0.9 %  sodium chloride  infusion ( Intravenous MAR Unhold 03/17/24 1108)  acetaminophen  (TYLENOL ) tablet 650 mg ( Oral MAR Unhold 03/17/24 1108)    Or  acetaminophen  (TYLENOL ) suppository 650 mg ( Rectal MAR Unhold 03/17/24 1108)  ondansetron  (ZOFRAN ) tablet 4 mg ( Oral See Alternative 03/17/24 2134)    Or  ondansetron  (ZOFRAN ) injection 4 mg (4 mg Intravenous Given 03/17/24 2134)  methocarbamol  (ROBAXIN ) injection 500 mg (500 mg Intravenous Given 03/18/24 2025)  Chlorhexidine  Gluconate Cloth 2 % PADS 6 each (6 each Topical Given 03/18/24 1153)  phenol (CHLORASEPTIC) mouth spray 1 spray (1 spray Mouth/Throat Given 03/17/24 2123)  fentaNYL  (SUBLIMAZE ) injection 25-50 mcg (50 mcg Intravenous Given 03/19/24 0023)  morphine  (PF) 2 MG/ML injection 2 mg (2 mg Intravenous Given 03/18/24 0146)  metoprolol  tartrate (LOPRESSOR ) injection 5 mg (5 mg Intravenous Given 03/18/24 2315)  hydrALAZINE  (APRESOLINE ) injection 10 mg (10 mg Intravenous Given 03/18/24 2123)  lactated ringers  infusion ( Intravenous New Bag/Given 03/18/24 0751)  enoxaparin  (LOVENOX ) injection 40 mg (40 mg Subcutaneous Given 03/18/24 1720)  sodium chloride  0.9 % bolus 1,000 mL (0  mLs Intravenous Stopped 03/16/24 2249)  adenosine  (ADENOCARD ) 6 MG/2ML injection 6 mg (6 mg Intravenous Given 03/16/24 2220)  adenosine  (ADENOCARD ) 6 MG/2ML injection 12 mg (12 mg Intravenous Given 03/16/24 2225)  metoprolol  tartrate (LOPRESSOR ) injection 5 mg (5 mg Intravenous Given 03/16/24 2231)  morphine  (PF) 4 MG/ML injection 4 mg (4 mg Intravenous Given 03/16/24 2300)  ondansetron  (ZOFRAN ) injection 4 mg (4 mg Intravenous Given 03/16/24 2300)  iohexol  (OMNIPAQUE ) 350 MG/ML injection 75 mL (75 mLs Intravenous Contrast Given 03/16/24 2253)  piperacillin -tazobactam (ZOSYN ) IVPB 3.375 g (  0 g Intravenous Stopped 03/17/24 0134)  fentaNYL  (SUBLIMAZE ) injection 50 mcg (50 mcg Intravenous Given 03/17/24 0941)  labetalol  (NORMODYNE ) injection 10 mg (10 mg Intravenous Given 03/18/24 0440)  potassium chloride  10 mEq in 100 mL IVPB (10 mEq Intravenous New Bag/Given 03/18/24 0934)    Reassessment after intervention:  patient now in sinus rhythm.    I did obtain Additional Historical Information from family at bedside.  Clinical Laboratory Tests Ordered, included CBC without leukocytosis.  Lactic acid normal.  No acute kidney injury.  Radiologic Tests Ordered, included CT abdomen/pelvis. I independently interpreted the images and agree with radiology interpretation.   Cardiac Monitor Tracing which shows SVT   Social Determinants of Health Risk denies any drug use.   Medical Decision Making: Summary:  The patient presents to the emergency department with primarily abdominal pain in the right lower abdomen but arrives with heart rate in the 190s.  Found to be in SVT.  She was moved to the resuscitation room where I administered vagal maneuvers which were not successful.  We then did 6 mg followed by 12 mg of adenosine  with conversion to sinus tachycardia.  Plan for CT abdomen pelvis and reassess.  Reevaluation with update and discussion with patient. Feeling well after returning to NSR. Discussed  abdominal CT results. General Surgery and Medicine consulted. Dr. Polina to admit.   Patient's presentation is most consistent with acute presentation with potential threat to life or bodily function.   Disposition: admit  ____________________________________________  FINAL CLINICAL IMPRESSION(S) / ED DIAGNOSES  Final diagnoses:  SVT (supraventricular tachycardia)  SBO (small bowel obstruction) (HCC)  Hernia of abdominal cavity    Note:  This document was prepared using Dragon voice recognition software and may include unintentional dictation errors.  Fonda Law, MD, [facility]    Doniqua Saxby, Fonda MATSU, MD 03/19/24 [phone]

## 2024-03-17 ENCOUNTER — Inpatient Hospital Stay (HOSPITAL_COMMUNITY)

## 2024-03-17 ENCOUNTER — Emergency Department (HOSPITAL_COMMUNITY)

## 2024-03-17 ENCOUNTER — Encounter (HOSPITAL_COMMUNITY): Payer: Self-pay | Admitting: Internal Medicine

## 2024-03-17 ENCOUNTER — Encounter (HOSPITAL_COMMUNITY): Admission: EM | Disposition: A | Payer: Self-pay | Source: Home / Self Care | Attending: Internal Medicine

## 2024-03-17 DIAGNOSIS — K460 Unspecified abdominal hernia with obstruction, without gangrene: Secondary | ICD-10-CM

## 2024-03-17 DIAGNOSIS — I471 Supraventricular tachycardia, unspecified: Principal | ICD-10-CM | POA: Insufficient documentation

## 2024-03-17 DIAGNOSIS — K56609 Unspecified intestinal obstruction, unspecified as to partial versus complete obstruction: Secondary | ICD-10-CM | POA: Diagnosis not present

## 2024-03-17 DIAGNOSIS — I10 Essential (primary) hypertension: Secondary | ICD-10-CM

## 2024-03-17 DIAGNOSIS — Z8739 Personal history of other diseases of the musculoskeletal system and connective tissue: Secondary | ICD-10-CM

## 2024-03-17 DIAGNOSIS — R000 Tachycardia, unspecified: Secondary | ICD-10-CM | POA: Diagnosis not present

## 2024-03-17 HISTORY — PX: LAPAROTOMY: SHX154

## 2024-03-17 LAB — URINALYSIS, ROUTINE W REFLEX MICROSCOPIC
Bilirubin Urine: NEGATIVE
Glucose, UA: NEGATIVE mg/dL
Ketones, ur: NEGATIVE mg/dL
Nitrite: NEGATIVE
Protein, ur: 30 mg/dL — AB
Specific Gravity, Urine: 1.04 — ABNORMAL HIGH (ref 1.005–1.030)
pH: 6 (ref 5.0–8.0)

## 2024-03-17 LAB — CBC
HCT: 43.9 % (ref 36.0–46.0)
Hemoglobin: 13.7 g/dL (ref 12.0–15.0)
MCH: 25 pg — ABNORMAL LOW (ref 26.0–34.0)
MCHC: 31.2 g/dL (ref 30.0–36.0)
MCV: 80 fL (ref 80.0–100.0)
Platelets: 231 K/uL (ref 150–400)
RBC: 5.49 MIL/uL — ABNORMAL HIGH (ref 3.87–5.11)
RDW: 13.7 % (ref 11.5–15.5)
WBC: 5.9 K/uL (ref 4.0–10.5)
nRBC: 0 % (ref 0.0–0.2)

## 2024-03-17 LAB — COMPREHENSIVE METABOLIC PANEL WITH GFR
ALT: 44 U/L (ref 0–44)
AST: 50 U/L — ABNORMAL HIGH (ref 15–41)
Albumin: 3.2 g/dL — ABNORMAL LOW (ref 3.5–5.0)
Alkaline Phosphatase: 103 U/L (ref 38–126)
Anion gap: 13 (ref 5–15)
BUN: 45 mg/dL — ABNORMAL HIGH (ref 8–23)
CO2: 23 mmol/L (ref 22–32)
Calcium: 8.4 mg/dL — ABNORMAL LOW (ref 8.9–10.3)
Chloride: 101 mmol/L (ref 98–111)
Creatinine, Ser: 1.18 mg/dL — ABNORMAL HIGH (ref 0.44–1.00)
GFR, Estimated: 53 mL/min — ABNORMAL LOW (ref >60)
Glucose, Bld: 113 mg/dL — ABNORMAL HIGH (ref 70–99)
Potassium: 3.4 mmol/L — ABNORMAL LOW (ref 3.5–5.1)
Sodium: 137 mmol/L (ref 135–145)
Total Bilirubin: 1.7 mg/dL — ABNORMAL HIGH (ref 0.0–1.2)
Total Protein: 7 g/dL (ref 6.5–8.1)

## 2024-03-17 LAB — I-STAT CG4 LACTIC ACID, ED: Lactic Acid, Venous: 2.1 mmol/L (ref 0.5–1.9)

## 2024-03-17 LAB — TROPONIN I (HIGH SENSITIVITY): Troponin I (High Sensitivity): 95 ng/L — ABNORMAL HIGH (ref <18)

## 2024-03-17 LAB — HIV ANTIBODY (ROUTINE TESTING W REFLEX): HIV Screen 4th Generation wRfx: NONREACTIVE

## 2024-03-17 SURGERY — LAPAROTOMY, EXPLORATORY
Anesthesia: General | Site: Abdomen

## 2024-03-17 MED ORDER — HYDRALAZINE HCL 20 MG/ML IJ SOLN
INTRAMUSCULAR | Status: DC | PRN
  Administered 2024-03-17: 5 mg via INTRAVENOUS

## 2024-03-17 MED ORDER — PROPOFOL 10 MG/ML IV BOLUS
INTRAVENOUS | Status: DC | PRN
  Administered 2024-03-17: 150 mg via INTRAVENOUS

## 2024-03-17 MED ORDER — METHOCARBAMOL 1000 MG/10ML IJ SOLN
500.0000 mg | Freq: Three times a day (TID) | INTRAMUSCULAR | Status: DC
  Administered 2024-03-17 – 2024-03-19 (×6): 500 mg via INTRAVENOUS
  Filled 2024-03-17 (×6): qty 10

## 2024-03-17 MED ORDER — DROPERIDOL 2.5 MG/ML IJ SOLN: 0.6250 mg | Freq: Once | INTRAMUSCULAR | Status: DC | PRN

## 2024-03-17 MED ORDER — PHENYLEPHRINE HCL-NACL 20-0.9 MG/250ML-% IV SOLN
INTRAVENOUS | Status: DC | PRN
  Administered 2024-03-17: 20 ug/min via INTRAVENOUS

## 2024-03-17 MED ORDER — OXYCODONE HCL 5 MG PO TABS: 5.0000 mg | ORAL_TABLET | Freq: Once | ORAL | Status: DC | PRN

## 2024-03-17 MED ORDER — PHENYLEPHRINE HCL (PRESSORS) 10 MG/ML IV SOLN
INTRAVENOUS | Status: AC
  Filled 2024-03-17: qty 1

## 2024-03-17 MED ORDER — LABETALOL HCL 5 MG/ML IV SOLN: 10.0000 mg | INTRAVENOUS | Status: DC | PRN

## 2024-03-17 MED ORDER — MIDAZOLAM HCL 2 MG/2ML IJ SOLN
INTRAMUSCULAR | Status: AC
  Filled 2024-03-17: qty 2

## 2024-03-17 MED ORDER — PROPOFOL 10 MG/ML IV BOLUS
INTRAVENOUS | Status: AC
  Filled 2024-03-17: qty 20

## 2024-03-17 MED ORDER — ONDANSETRON HCL 4 MG/2ML IJ SOLN
4.0000 mg | Freq: Four times a day (QID) | INTRAMUSCULAR | Status: DC | PRN
  Administered 2024-03-17 – 2024-03-24 (×6): 4 mg via INTRAVENOUS
  Filled 2024-03-17 (×5): qty 2

## 2024-03-17 MED ORDER — LIDOCAINE HCL (CARDIAC) PF 100 MG/5ML IV SOSY
PREFILLED_SYRINGE | INTRAVENOUS | Status: DC | PRN
  Administered 2024-03-17: 80 mg via INTRAVENOUS

## 2024-03-17 MED ORDER — SODIUM CHLORIDE 0.9 % IV SOLN: 250.0000 mL | INTRAVENOUS | Status: AC | PRN

## 2024-03-17 MED ORDER — ACETAMINOPHEN 650 MG RE SUPP: 650.0000 mg | Freq: Four times a day (QID) | RECTAL | Status: DC | PRN

## 2024-03-17 MED ORDER — SUCCINYLCHOLINE CHLORIDE 200 MG/10ML IV SOSY
PREFILLED_SYRINGE | INTRAVENOUS | Status: AC
  Filled 2024-03-17: qty 10

## 2024-03-17 MED ORDER — 0.9 % SODIUM CHLORIDE (POUR BTL) OPTIME
TOPICAL | Status: DC | PRN
  Administered 2024-03-17: 1000 mL

## 2024-03-17 MED ORDER — ACETAMINOPHEN 325 MG PO TABS: 650.0000 mg | ORAL_TABLET | Freq: Four times a day (QID) | ORAL | Status: DC | PRN

## 2024-03-17 MED ORDER — HYDROMORPHONE HCL 1 MG/ML IJ SOLN
0.5000 mg | INTRAMUSCULAR | Status: DC | PRN
  Administered 2024-03-17 (×3): 1 mg via INTRAVENOUS
  Filled 2024-03-17 (×2): qty 1

## 2024-03-17 MED ORDER — EPHEDRINE SULFATE (PRESSORS) 25 MG/5ML IV SOSY
PREFILLED_SYRINGE | INTRAVENOUS | Status: DC | PRN
  Administered 2024-03-17 (×2): 5 mg via INTRAVENOUS

## 2024-03-17 MED ORDER — FENTANYL CITRATE (PF) 100 MCG/2ML IJ SOLN
INTRAMUSCULAR | Status: AC
  Filled 2024-03-17: qty 2

## 2024-03-17 MED ORDER — SODIUM CHLORIDE 0.9% FLUSH
3.0000 mL | Freq: Two times a day (BID) | INTRAVENOUS | Status: DC
  Administered 2024-03-17 – 2024-03-30 (×23): 3 mL via INTRAVENOUS

## 2024-03-17 MED ORDER — ONDANSETRON HCL 4 MG/2ML IJ SOLN
INTRAMUSCULAR | Status: AC
  Filled 2024-03-17: qty 2

## 2024-03-17 MED ORDER — FENTANYL CITRATE (PF) 100 MCG/2ML IJ SOLN
25.0000 ug | INTRAMUSCULAR | Status: DC | PRN
  Administered 2024-03-17 (×3): 50 ug via INTRAVENOUS

## 2024-03-17 MED ORDER — DEXTROSE IN LACTATED RINGERS 5 % IV SOLN: INTRAVENOUS | Status: AC

## 2024-03-17 MED ORDER — ROCURONIUM BROMIDE 100 MG/10ML IV SOLN
INTRAVENOUS | Status: DC | PRN
  Administered 2024-03-17: 60 mg via INTRAVENOUS
  Administered 2024-03-17: 10 mg via INTRAVENOUS

## 2024-03-17 MED ORDER — ONDANSETRON HCL 4 MG PO TABS
4.0000 mg | ORAL_TABLET | Freq: Four times a day (QID) | ORAL | Status: DC | PRN
  Filled 2024-03-17: qty 1

## 2024-03-17 MED ORDER — LABETALOL HCL 5 MG/ML IV SOLN
10.0000 mg | Freq: Four times a day (QID) | INTRAVENOUS | Status: DC | PRN
  Administered 2024-03-17: 10 mg via INTRAVENOUS
  Filled 2024-03-17: qty 4

## 2024-03-17 MED ORDER — ALBUMIN HUMAN 5 % IV SOLN: INTRAVENOUS | Status: DC | PRN

## 2024-03-17 MED ORDER — BUPIVACAINE-EPINEPHRINE (PF) 0.25% -1:200000 IJ SOLN
INTRAMUSCULAR | Status: AC
  Filled 2024-03-17: qty 30

## 2024-03-17 MED ORDER — FENTANYL CITRATE (PF) 100 MCG/2ML IJ SOLN
INTRAMUSCULAR | Status: DC | PRN
  Administered 2024-03-17: 100 ug via INTRAVENOUS
  Administered 2024-03-17: 50 ug via INTRAVENOUS
  Administered 2024-03-17: 100 ug via INTRAVENOUS

## 2024-03-17 MED ORDER — FENTANYL CITRATE (PF) 250 MCG/5ML IJ SOLN
INTRAMUSCULAR | Status: AC
  Filled 2024-03-17: qty 5

## 2024-03-17 MED ORDER — OXYCODONE HCL 5 MG/5ML PO SOLN: 5.0000 mg | Freq: Once | ORAL | Status: DC | PRN

## 2024-03-17 MED ORDER — PIPERACILLIN-TAZOBACTAM 3.375 G IVPB 30 MIN
3.3750 g | Freq: Once | INTRAVENOUS | Status: AC
  Administered 2024-03-17: 3.375 g via INTRAVENOUS
  Filled 2024-03-17: qty 50

## 2024-03-17 MED ORDER — ACETAMINOPHEN 10 MG/ML IV SOLN
INTRAVENOUS | Status: AC
Start: 2024-03-17 — End: 2024-03-17
  Filled 2024-03-17: qty 100

## 2024-03-17 MED ORDER — LACTATED RINGERS IV SOLN
INTRAVENOUS | Status: DC | PRN
Start: 2024-03-17 — End: 2024-03-17

## 2024-03-17 MED ORDER — PIPERACILLIN-TAZOBACTAM 3.375 G IVPB
3.3750 g | Freq: Three times a day (TID) | INTRAVENOUS | Status: DC
  Administered 2024-03-17 – 2024-03-22 (×15): 3.375 g via INTRAVENOUS
  Filled 2024-03-17 (×15): qty 50

## 2024-03-17 MED ORDER — STERILE WATER FOR IRRIGATION IR SOLN
Status: DC | PRN
  Administered 2024-03-17: 1000 mL

## 2024-03-17 MED ORDER — CHLORHEXIDINE GLUCONATE CLOTH 2 % EX PADS
6.0000 | MEDICATED_PAD | Freq: Every day | CUTANEOUS | Status: DC
  Administered 2024-03-17 – 2024-03-20 (×4): 6 via TOPICAL

## 2024-03-17 MED ORDER — ACETAMINOPHEN 10 MG/ML IV SOLN
1000.0000 mg | Freq: Once | INTRAVENOUS | Status: DC | PRN
  Administered 2024-03-17: 1000 mg via INTRAVENOUS

## 2024-03-17 MED ORDER — SODIUM CHLORIDE 0.9% FLUSH: 3.0000 mL | INTRAVENOUS | Status: DC | PRN

## 2024-03-17 MED ORDER — MIDAZOLAM HCL 5 MG/5ML IJ SOLN
INTRAMUSCULAR | Status: DC | PRN
  Administered 2024-03-17: 2 mg via INTRAVENOUS

## 2024-03-17 MED ORDER — HYDROMORPHONE HCL 1 MG/ML IJ SOLN
INTRAMUSCULAR | Status: AC
  Filled 2024-03-17: qty 1

## 2024-03-17 MED ORDER — PHENOL 1.4 % MT LIQD
1.0000 | OROMUCOSAL | Status: DC | PRN
  Administered 2024-03-17: 1 via OROMUCOSAL
  Filled 2024-03-17: qty 177

## 2024-03-17 MED ORDER — FENTANYL CITRATE (PF) 100 MCG/2ML IJ SOLN
50.0000 ug | Freq: Once | INTRAMUSCULAR | Status: AC
  Administered 2024-03-17: 50 ug via INTRAVENOUS

## 2024-03-17 MED ORDER — MORPHINE SULFATE (PF) 2 MG/ML IV SOLN
2.0000 mg | INTRAVENOUS | Status: DC | PRN
  Administered 2024-03-17 – 2024-03-22 (×5): 2 mg via INTRAVENOUS
  Filled 2024-03-17 (×6): qty 1

## 2024-03-17 MED ORDER — SUGAMMADEX SODIUM 200 MG/2ML IV SOLN
INTRAVENOUS | Status: DC | PRN
Start: 2024-03-17 — End: 2024-03-17
  Administered 2024-03-17: 50 mg via INTRAVENOUS
  Administered 2024-03-17: 150 mg via INTRAVENOUS

## 2024-03-17 MED ORDER — PHENYLEPHRINE HCL (PRESSORS) 10 MG/ML IV SOLN
INTRAVENOUS | Status: DC | PRN
  Administered 2024-03-17 (×2): 80 ug via INTRAVENOUS

## 2024-03-17 MED ORDER — FENTANYL CITRATE (PF) 50 MCG/ML IJ SOSY
25.0000 ug | PREFILLED_SYRINGE | INTRAMUSCULAR | Status: DC | PRN
  Administered 2024-03-17 (×2): 50 ug via INTRAVENOUS
  Filled 2024-03-17 (×2): qty 1

## 2024-03-17 MED ORDER — SUCCINYLCHOLINE CHLORIDE 200 MG/10ML IV SOSY
PREFILLED_SYRINGE | INTRAVENOUS | Status: DC | PRN
  Administered 2024-03-17: 80 mg via INTRAVENOUS

## 2024-03-17 MED ORDER — FENTANYL CITRATE (PF) 50 MCG/ML IJ SOSY
25.0000 ug | PREFILLED_SYRINGE | INTRAMUSCULAR | Status: DC | PRN
  Administered 2024-03-18 – 2024-03-20 (×15): 50 ug via INTRAVENOUS
  Filled 2024-03-17 (×15): qty 1

## 2024-03-17 SURGICAL SUPPLY — 39 items
BAG COUNTER SPONGE SURGICOUNT (BAG) ×1 IMPLANT
BINDER ABD UNIV 12 45-62 (WOUND CARE) IMPLANT
BLADE CLIPPER SURG (BLADE) IMPLANT
CANISTER SUCTION 3000ML PPV (SUCTIONS) ×1 IMPLANT
CHLORAPREP W/TINT 26 (MISCELLANEOUS) ×1 IMPLANT
COVER SURGICAL LIGHT HANDLE (MISCELLANEOUS) ×1 IMPLANT
DRAPE LAPAROSCOPIC ABDOMINAL (DRAPES) ×1 IMPLANT
DRAPE WARM FLUID 44X44 (DRAPES) ×1 IMPLANT
DRSG OPSITE POSTOP 4X10 (GAUZE/BANDAGES/DRESSINGS) IMPLANT
DRSG OPSITE POSTOP 4X8 (GAUZE/BANDAGES/DRESSINGS) IMPLANT
ELECT BLADE 6.5 EXT (BLADE) IMPLANT
ELECT CAUTERY BLADE 6.4 (BLADE) ×1 IMPLANT
ELECTRODE REM PT RTRN 9FT ADLT (ELECTROSURGICAL) ×1 IMPLANT
GLOVE BIOGEL PI MICRO STRL 6 (GLOVE) ×1 IMPLANT
GLOVE INDICATOR 6.5 STRL GRN (GLOVE) ×1 IMPLANT
GOWN STRL REUS W/ TWL LRG LVL3 (GOWN DISPOSABLE) ×2 IMPLANT
HANDLE SUCTION POOLE (INSTRUMENTS) ×1 IMPLANT
KIT BASIN OR (CUSTOM PROCEDURE TRAY) ×1 IMPLANT
KIT TURNOVER KIT B (KITS) ×1 IMPLANT
LIGASURE IMPACT 36 18CM CVD LR (INSTRUMENTS) IMPLANT
PACK GENERAL/GYN (CUSTOM PROCEDURE TRAY) ×1 IMPLANT
PAD ARMBOARD POSITIONER FOAM (MISCELLANEOUS) ×1 IMPLANT
PENCIL SMOKE EVACUATOR (MISCELLANEOUS) ×1 IMPLANT
RELOAD STAPLE 75 3.8 BLU REG (ENDOMECHANICALS) IMPLANT
SOLN 0.9% NACL POUR BTL 1000ML (IV SOLUTION) ×2 IMPLANT
SPECIMEN JAR LARGE (MISCELLANEOUS) IMPLANT
SPONGE T-LAP 18X18 ~~LOC~~+RFID (SPONGE) IMPLANT
STAPLER PROXIMATE 75MM BLUE (STAPLE) IMPLANT
STAPLER SKIN PROX 35W (STAPLE) ×1 IMPLANT
SUT NOVA NAB GS-21 0 18 T12 DT (SUTURE) IMPLANT
SUT PDS AB 1 TP1 96 (SUTURE) ×2 IMPLANT
SUT VIC AB 2-0 SH 18 (SUTURE) ×1 IMPLANT
SUT VIC AB 3-0 SH 18 (SUTURE) ×1 IMPLANT
SUT VIC AB 3-0 SH 27X BRD (SUTURE) IMPLANT
SUT VICRYL AB 2 0 TIES (SUTURE) ×1 IMPLANT
SUT VICRYL AB 3 0 TIES (SUTURE) ×1 IMPLANT
TOWEL GREEN STERILE (TOWEL DISPOSABLE) ×1 IMPLANT
TRAY FOLEY MTR SLVR 16FR STAT (SET/KITS/TRAYS/PACK) IMPLANT
TRAY FOLEY W/BAG SLVR 14FR (SET/KITS/TRAYS/PACK) IMPLANT

## 2024-03-17 NOTE — Brief Op Note (Signed)
 03/17/2024  7:56 AM  PATIENT:  Denise Hudson [place]  63 y.o. female  PRE-OPERATIVE DIAGNOSIS:  Incarcerated Incisional Hernia  POST-OPERATIVE DIAGNOSIS:  Incarcerated Incisional Hernia  PROCEDURE:  Procedure(s) with comments: LAPAROTOMY, EXPLORATORY SMALL BOWEL RESECTION WOUND EXPLORATION (N/A) - Repair of incarcerated Incisional hernia  SURGEON:  Surgeons and Role:    DEWAINE Ann Fine, MD - Primary  ANESTHESIA:   general  FINDINGS: Incarcerated and strangulated small bowel in incisional hernia. Small 1.5x1cm defect. Bowel still with some signs of ischemia after reduced so resection and primarily anastomosed. Primary anastomosis of incisional hernia defect given necrotic mesentery and omentum within hernia sac and small size. NGT position confirmed intra-op  EBL:  50 mL   DRAINS: none   LOCAL MEDICATIONS USED:  NONE  SPECIMEN:  Small bowel resection  DISPOSITION OF SPECIMEN:  PATHOLOGY  COUNTS:  YES, CORRECT  PLAN OF CARE: Admitted to [facility]  PATIENT DISPOSITION:  PACU - hemodynamically stable.   Delay start of Pharmacological VTE agent (>24hrs) due to surgical blood loss or risk of bleeding: no

## 2024-03-17 NOTE — ED Notes (Addendum)
 Patient unable to tolerate NGT inserted by RN , NGT removed , refused further attempts to insert NGT by RN.

## 2024-03-17 NOTE — Anesthesia Postprocedure Evaluation (Signed)
 Anesthesia Post Note  Patient: Denise Hudson  Procedure(s) Performed: LAPAROTOMY, EXPLORATORY SMALL BOWEL RESECTION WOUND EXPLORATION (Abdomen)     Patient location during evaluation: PACU Anesthesia Type: General Level of consciousness: awake Pain management: pain level controlled Vital Signs Assessment: post-procedure vital signs reviewed and stable Respiratory status: spontaneous breathing Cardiovascular status: blood pressure returned to baseline Postop Assessment: no apparent nausea or vomiting Anesthetic complications: no   No notable events documented.             Lauraine KATHEE [place]

## 2024-03-17 NOTE — Anesthesia Procedure Notes (Signed)
 Procedure Name: Intubation Date/Time: 03/17/2024 5:13 AM  Performed by: Indi Willhite T, CRNAPre-anesthesia Checklist: Patient identified, Emergency Drugs available, Suction available and Patient being monitored Patient Re-evaluated:Patient Re-evaluated prior to induction Oxygen Delivery Method: Circle system utilized Preoxygenation: Pre-oxygenation with 100% oxygen Induction Type: IV induction, Rapid sequence and Cricoid Pressure applied Ventilation: Mask ventilation without difficulty Laryngoscope Size: Miller and 3 Grade View: Grade II Tube type: Oral Tube size: 7.0 mm Number of attempts: 1 Airway Equipment and Method: Stylet and Oral airway Placement Confirmation: ETT inserted through vocal cords under direct vision, positive ETCO2 and breath sounds checked- equal and bilateral Secured at: 23 cm Tube secured with: Tape Dental Injury: Teeth and Oropharynx as per pre-operative assessment  Difficulty Due To: Difficult Airway- due to dentition Future Recommendations: Recommend- induction with short-acting agent, and alternative techniques readily available

## 2024-03-17 NOTE — H&P (Addendum)
 History and Physical    Denise Hudson FMW:[id number] DOB: 1960/07/01 DOA: 03/16/2024  PCP: Pcp, No   Patient coming from: Home   Chief Complaint:  Chief Complaint  Patient presents with   Abdominal Pain   ED TRIAGE note: Patient from home, noted to have a hard knot to her lower R abdomen. She noticed this around noon time. Endorses n/v/. Denies chest pain, shortness of breath. Denies fevers, chills.             HPI:  Denise Hudson is a 63 y.o. 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  Patient denies any fever, chill and diarrhea.  Denies any chest pain, shortness of breath and palpitation. During my evaluation at the bedside patient is complaining about right-sided lower abdominal pain currently 6 out of 10 after receiving morphine  4 mg in the ED. Patient denies any chest pain, palpitation and shortness of breath.  Denies any history of elevated heart rate in the past. Per patient she is not able to pass gas and has last bowel movement yesterday morning 03/16/2024. In the ED unsuccessful attempt to place the NG tube given patient has poor tolerance due to extensive gag reflex.   ED Course:  At presentation to ED patient found borderline hypertensive and initial heart rate was 198 which improved to low 90s after receiving adenosine  12 mg followed by 6 mg and Lopressor  5 mg.  Initial EKG showed supraventricular tachycardia heart rate 181 which improved with repeat EKG after receiving 2 doses of adenosine  and Lopressor . EKG showing sinus tachycardia heart rate 100 and premature ventricular complex.  Imaging,  CT abdomen pelvis showing SBO and incarcerated hernia. 1. Small bowel  obstruction with a transition zone seen as a loop of small bowel enters within a lateral ventral hernia seen along the mid to lower abdomen on the right. 2. Short segment of thickened and inflamed small bowel within the previously noted right lateral ventral hernia. An early closed loop small bowel obstruction cannot be excluded. 3. Bilateral subcentimeter nonobstructing renal calculi. 4. Small hiatal hernia. 5. 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 6. Aortic atherosclerosis.  Lab work, CBC unremarkable stable H&H normal WBC platelet count.  CMP showing low bicarb 18, elevated creatinine 1.31, and evidence of chronic transaminitis.  Normal lipase level. Elevated troponin 88 and pending second troponin level.  Normal lactic acid level 1.8.  Pending UA.  Normal TSH.  General surgery Dr. Ann has been consulted plan to take to the OR tonight and recommended to initiate IV antibiotic. Hospitalist consulted for further evaluation management of SVT, SBO and incarcerated  hernia.   Significant labs in the ED: Lab Orders         Culture, blood (Routine X 2) w Reflex to ID Panel         Lipase, blood         Comprehensive metabolic panel         CBC         Urinalysis, Routine w reflex microscopic -Urine, Clean Catch         TSH         Rapid urine drug screen (hospital performed)         HIV Antibody (routine testing w rflx)  CBC         Comprehensive metabolic panel         I-stat chem 8, ED (not at [facility], [facility] or [facility])         I-Stat Lactic Acid       Review of Systems:  Review of Systems  Constitutional:  Negative for chills, fever and weight loss.  Respiratory:  Negative for cough, sputum production and shortness of breath.   Cardiovascular:  Negative for chest pain and palpitations.  Gastrointestinal:  Positive for abdominal pain. Negative for blood in stool, constipation, diarrhea, heartburn, nausea and vomiting.  Neurological:  Negative  for dizziness and headaches.  Psychiatric/Behavioral:  The patient is not nervous/anxious.     Past Medical History:  Diagnosis Date   Constipation    History of acute pyelonephritis 07/12/2022   secondary to uropathy obstruction due to kidney stones   History of kidney stones    Microcytic anemia 07/12/2022   Nephrolithiasis    left side calculi non-obstructive per CT 07-18-2022   Ureteral calculi 07/12/2022   right side   Wears contact lenses     Past Surgical History:  Procedure Laterality Date   APPENDECTOMY  age 78   CYSTOSCOPY WITH STENT PLACEMENT Right 02/10/2017   Procedure: CYSTOSCOPY WITH RIGHT STENT PLACEMENT;  Surgeon: Devere Lonni Righter, MD;  Location: [facility];  Service: Urology;  Laterality: Right;   CYSTOSCOPY/URETEROSCOPY/HOLMIUM LASER/STENT PLACEMENT Right 03/04/2017   Procedure: CYSTOSCOPY/URETEROSCOPY/HOLMIUM LASER/STENT PLACEMENT;  Surgeon: Devere Lonni Righter, MD;  Location: [facility];  Service: Urology;  Laterality: Right;  NEEDS 30 MIN FOR PROCEDURE   CYSTOSCOPY/URETEROSCOPY/HOLMIUM LASER/STENT PLACEMENT Right 07/13/2022   Procedure: CYSTOSCOPY RIGHT URETERAL STENT PLACEMENT;  Surgeon: Carolee Sherwood JONETTA DOUGLAS, MD;  Location: [facility];  Service: Urology;  Laterality: Right;   CYSTOSCOPY/URETEROSCOPY/HOLMIUM LASER/STENT PLACEMENT Right 07/23/2022   Procedure: CYSTOSCOPY RIGHT URETEROSCOPY, HOLMIUM LASER LITHOTRIPSY, AND RIGHT URETERAL STENT PLACEMENT;  Surgeon: Devere Lonni Righter, MD;  Location: [facility];  Service: Urology;  Laterality: Right;  90 MINUTES   EXTRACORPOREAL SHOCK WAVE LITHOTRIPSY  2008   IR LUMBAR DISC ASPIRATION W/IMG GUIDE  08/12/2022     reports that she quit smoking about 10 years ago. Her smoking use included cigarettes. She started smoking about 30 years ago. She has never used smokeless tobacco. She reports that she does not drink alcohol and does not use drugs.  No Known Allergies  History  reviewed. No pertinent family history.  Prior to Admission medications   Medication Sig Start Date End Date Taking? Authorizing Provider  amLODipine  (NORVASC ) 5 MG tablet Take 1 tablet (5 mg total) by mouth daily. 08/18/22   Danford, Lonni SQUIBB, MD  amoxicillin  (AMOXIL ) 500 MG capsule Take 2 capsules by mouth 3 times daily. 10/02/22   Manandhar, Sabina, MD  cefTRIAXone  (ROCEPHIN ) IVPB Inject into the vein. Patient not taking: Reported on 11/06/2022    [provider]  feeding supplement (ENSURE ENLIVE / ENSURE PLUS) LIQD Take 237 mLs by mouth 3 (three) times daily between meals. 08/18/22   Danford, Lonni SQUIBB, MD  ferrous sulfate  325 (65 FE) MG tablet Take 1 tablet (325 mg total) by mouth every other day. 08/18/22   Danford, Lonni SQUIBB, MD  hydrochlorothiazide  (HYDRODIURIL ) 12.5 MG tablet Take 1 tablet (12.5 mg total) by mouth daily. 08/18/22   Danford, Lonni SQUIBB, MD  methocarbamol  (ROBAXIN ) 500 MG tablet Take 1 tablet (500 mg total) by mouth every 8 (eight) hours as needed for muscle  spasms. Patient not taking: Reported on 11/06/2022 08/18/22   Jonel Lonni SQUIBB, MD  ondansetron  (ZOFRAN ) 4 MG tablet Take 1 tablet (4 mg total) by mouth every 6 (six) hours as needed for nausea. Patient not taking: Reported on 08/10/2022 07/19/22   Jonel Lonni SQUIBB, MD  oxyCODONE -acetaminophen  (PERCOCET/ROXICET) 5-325 MG tablet Take 1 tablet by mouth every 6 hours as needed for severe pain. Patient not taking: Reported on 10/02/2022 09/17/22   Smoot, Lauraine LABOR, PA-C  polyethylene glycol powder (GLYCOLAX /MIRALAX ) 17 GM/SCOOP powder Take 17 g by mouth 3 (three) times daily as needed for mild constipation. Patient not taking: Reported on 11/06/2022 08/18/22   Jonel Lonni SQUIBB, MD  senna-docusate (SENOKOT-S) 8.6-50 MG tablet Take 2 tablets by mouth 2 (two) times daily as needed for moderate constipation. Patient not taking: Reported on 11/06/2022 08/18/22   Jonel Lonni SQUIBB, MD     Physical  Exam: Vitals:   03/17/24 0030 03/17/24 0045 03/17/24 0100 03/17/24 0137  BP: (!) 173/91 (!) 187/94 139/70   Pulse: 69 79 66   Resp: 17 12 15    Temp:    98 F (36.7 C)  TempSrc:    Oral  SpO2: 91% 92% 96%   Weight:      Height:        Physical Exam Vitals and nursing note reviewed.  Constitutional:      Appearance: She is well-developed. She is ill-appearing.  Cardiovascular:     Rate and Rhythm: Regular rhythm. Tachycardia present.     Heart sounds: Normal heart sounds.  Abdominal:     General: Bowel sounds are absent. There is no distension.     Palpations: Abdomen is soft.     Tenderness: There is abdominal tenderness in the right lower quadrant. There is no guarding or rebound.      Comments: Right lower quadrant hard ball-like presentation and tenderness on palpation.  Otherwise rest of the abdomen soft on palpation and nontender.  Skin:    General: Skin is dry.     Capillary Refill: Capillary refill takes less than 2 seconds.  Neurological:     Mental Status: She is alert and oriented to person, place, and time.  Psychiatric:        Mood and Affect: Mood is not anxious.      Labs on Admission: I have personally reviewed following labs and imaging studies  CBC: Recent Labs  Lab 03/16/24 2215 03/16/24 2222 03/17/24 0050  WBC 7.3  --  5.9  HGB 15.2* 18.0* 13.7  HCT 48.2* 53.0* 43.9  MCV 79.0*  --  80.0  PLT 245  --  231   Basic Metabolic Panel: Recent Labs  Lab 03/16/24 2215 03/16/24 2222 03/17/24 0050  NA 137 138 137  K 3.5 3.5 3.4*  CL 99 103 101  CO2 18*  --  23  GLUCOSE 130* 134* 113*  BUN 50* 52* 45*  CREATININE 1.31* 1.50* 1.18*  CALCIUM 9.6  --  8.4*   GFR: Estimated Creatinine Clearance: 53 mL/min (A) (by C-G formula based on SCr of 1.18 mg/dL (H)). Liver Function Tests: Recent Labs  Lab 03/16/24 2215 03/17/24 0050  AST 60* 50*  ALT 52* 44  ALKPHOS 119 103  BILITOT 2.1* 1.7*  PROT 7.9 7.0  ALBUMIN 3.8 3.2*   Recent Labs  Lab  03/16/24 2215  LIPASE 34   No results for input(s): AMMONIA in the last 168 hours. Coagulation Profile: No results for input(s): INR, PROTIME in the last  168 hours. Cardiac Enzymes: Recent Labs  Lab 03/16/24 2215 03/17/24 0050  TROPONINIHS 88* 95*   BNP (last 3 results) No results for input(s): BNP in the last 8760 hours. HbA1C: No results for input(s): HGBA1C in the last 72 hours. CBG: No results for input(s): GLUCAP in the last 168 hours. Lipid Profile: No results for input(s): CHOL, HDL, LDLCALC, TRIG, CHOLHDL, LDLDIRECT in the last 72 hours. Thyroid Function Tests: Recent Labs    03/16/24 2310  TSH 3.054   Anemia Panel: No results for input(s): VITAMINB12, FOLATE, FERRITIN, TIBC, IRON, RETICCTPCT in the last 72 hours. Urine analysis:    Component Value Date/Time   COLORURINE YELLOW 03/16/2024 2359   APPEARANCEUR HAZY (A) 03/16/2024 2359   LABSPEC 1.040 (H) 03/16/2024 2359   PHURINE 6.0 03/16/2024 2359   GLUCOSEU NEGATIVE 03/16/2024 2359   HGBUR SMALL (A) 03/16/2024 2359   BILIRUBINUR NEGATIVE 03/16/2024 2359   KETONESUR NEGATIVE 03/16/2024 2359   PROTEINUR 30 (A) 03/16/2024 2359   UROBILINOGEN 1.0 01/02/2015 0043   NITRITE NEGATIVE 03/16/2024 2359   LEUKOCYTESUR LARGE (A) 03/16/2024 2359    Radiological Exams on Admission: I have personally reviewed images DG Chest 2 View Result Date: 03/17/2024 EXAM: 2 VIEW(S) XRAY OF THE CHEST 03/17/2024 12:30:50 AM COMPARISON: None available. CLINICAL HISTORY: abd pain FINDINGS: LINES, TUBES AND DEVICES: Pacer/resuscitation pads over left chest. LUNGS AND PLEURA: No focal pulmonary opacity. No pleural effusion. No pneumothorax. HEART AND MEDIASTINUM: Cardiac size is within normal limits. No acute abnormality of the mediastinal silhouette. BONES AND SOFT TISSUES: No acute osseous abnormality. IMPRESSION: 1. No acute findings. Electronically signed by: Dorethia Molt MD 03/17/2024 12:44 AM EST  RP Workstation: [id number]   CT ABDOMEN PELVIS W CONTRAST Result Date: 03/16/2024 CLINICAL DATA:  Hard knot to her lower right abdomen. EXAM: CT ABDOMEN AND PELVIS WITH CONTRAST TECHNIQUE: Multidetector CT imaging of the abdomen and pelvis was performed using the standard protocol following bolus administration of intravenous contrast. RADIATION DOSE REDUCTION: This exam was performed according to the departmental dose-optimization program which includes automated exposure control, adjustment of the mA and/or kV according to patient size and/or use of iterative reconstruction technique. CONTRAST:  75mL OMNIPAQUE  IOHEXOL  350 MG/ML SOLN COMPARISON:  August 10, 2022 FINDINGS: Lower chest: No acute abnormality. Hepatobiliary: No focal liver abnormality is seen. No gallstones, gallbladder wall thickening, or biliary dilatation. Pancreas: Unremarkable. No pancreatic ductal dilatation or surrounding inflammatory changes. Spleen: Several tiny, stable foci of parenchymal low attenuation are seen scattered throughout the spleen. Adrenals/Urinary Tract: Adrenal glands are unremarkable. Kidneys are normal in size. A 4.2 cm simple cyst is seen within the mid right kidney. A 3 mm nonobstructing renal calculus is seen within the lower pole of the right kidney. A 2 mm nonobstructing renal calculus is present within the mid to lower left kidney. Bladder is unremarkable. Stomach/Bowel: There is a small hiatal hernia. The appendix is not identified. Multiple dilated small bowel loops are seen throughout the abdomen and pelvis (maximum small bowel diameter of approximately 3.1 cm). A transition zone is seen as a loop of small bowel enters within a lateral ventral hernia seen along the mid to lower right abdominal wall (see below). Multiple surgical clips are seen within the pelvis on the right. Vascular/Lymphatic: Aortic atherosclerosis. No enlarged abdominal or pelvic lymph nodes. Reproductive: Uterus and bilateral adnexa are  unremarkable. Other: There is a 3.8 cm x 8.1 cm x 9.7 cm ventral hernia seen along the mid to lower abdomen on the right.  This contains moderately inflamed mesenteric fat, fluid and a short segment mildly thickened small bowel that abruptly narrows as it enters the hernia. No abdominopelvic fluid Musculoskeletal: Multilevel chronic and degenerative changes are seen throughout the lumbar spine. IMPRESSION: 1. Small bowel obstruction with a transition zone seen as a loop of small bowel enters within a lateral ventral hernia seen along the mid to lower abdomen on the right. 2. Short segment of thickened and inflamed small bowel within the previously noted right lateral ventral hernia. An early closed loop small bowel obstruction cannot be excluded. 3. Bilateral subcentimeter nonobstructing renal calculi. 4. Small hiatal hernia. 5. 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 6. Aortic atherosclerosis. Electronically Signed   By: Suzen Dials M.D.   On: 03/16/2024 23:20     EKG: My personal interpretation of EKG shows: Sinus tachycardia heart rate 100.    Assessment/Plan: Principal Problem:   Incarcerated intra-abdominal hernia Active Problems:   Paroxysmal SVT (supraventricular tachycardia)   Essential hypertension   SBO (small bowel obstruction) (HCC)   History of osteomyelitis   Incarcerated hernia    Assessment and Plan: Incarcerated intra-abdominal hernia Small bowel obstruction -Patient presents emergency department with complaining of sudden onset of right-sided lower abdominal pain with associated vomiting.  Patient denies any fever, chill and diarrhea. - Sensation to ED patient found to be in SVT which resolved after 2 doses of adenosine  and IV Lopressor .  Found to have elevated blood pressure.  Otherwise hemodynamically stable. -CBC no evidence of leukocytosis.  Initial lactic acid 1.8 which trended up to 2.1.  Lactic acidosis in setting of incarcerated  hernia.  In the ED patient received 1 L of NS bolus.  Continue maintenance fluid D5 LR 125 cc/h. -CT abdomen pelvis showed SBO with concern for incarcerated hernia. - Unsuccessful attempt to place NG tube in the ED due to patient has extensive gag reflex per general surgery Dr. Ann has been consulted plan to take 2 OR tonight. - Obtaining blood culture and starting IV Zosyn  with pharmacy consult. -Appreciate general surgery input.   Paroxysmal supraventricular tachycardia-resolved -Initial EKG showed supraventricular tachycardia heart rate 181.  Patient received adenosine  12 mg followed by 6 mg afterward Lopressor  5 mg which resolved the PSVT and converted to sinus rhythm.  Repeat EKG showing sinus tachycardia heart rate 110.  Lab work, normal TSH level.  Concern for PSVT in the setting of pain response in the context of incarcerated hernia.  Per chart review patient does not have any history of PSVT in the past. -Checking UDS.  ObtainIing echocardiogram. Continue cardiac monitoring.   History of essential hypertension -Patient is not compliant with blood pressure regimen at home.  Continue IV labetalol  as needed.   Elevated troponin secondary to demand ischemia - Flat troponin 88 and 95.  EKG showing PSVT which converted to sinus tachycardia heart rate around 63-84 now. Elevated troponin in the setting of demand ischemia in the context of PSVT.    DVT prophylaxis:  SCDs and TED hose.  Holding pharmacological DVT prophylaxis in case patient will undergo surgical intervention tonight. Code Status:  Full Code Diet: Keeping patient n.p.o. Family Communication:   Family was present at bedside, at the time of interview. Opportunity was given to ask question and all questions were answered satisfactorily.  Disposition Plan: Plan for surgical intervention tonight.  General surgery on board.  Continue to monitor redevelopment of any SVT.SABRA Consults: General Surgery Admission status:    Inpatient, progressive unit  Severity of Illness: The appropriate patient status for this patient is INPATIENT. Inpatient status is judged to be reasonable and necessary in order to provide the required intensity of service to ensure the patient's safety. The patient's presenting symptoms, physical exam findings, and initial radiographic and laboratory data in the context of their chronic comorbidities is felt to place them at high risk for further clinical deterioration. Furthermore, it is not anticipated that the patient will be medically stable for discharge from the hospital within 2 midnights of admission.   * I certify that at the point of admission it is my clinical judgment that the patient will require inpatient hospital care spanning beyond 2 midnights from the point of admission due to high intensity of service, high risk for further deterioration and high frequency of surveillance required.DEWAINE    Azim Gillingham, MD [facility]  How to contact the [facility] Attending or Consulting provider 7A - 7P or covering provider during after hours 7P -7A, for this patient.  Check the care team in [facility] and look for a) attending/consulting [facility] provider listed and b) the [facility] team listed Log into [email or web address] and use [place]'s universal password to access. If you do not have the password, please contact the hospital operator. Locate the [facility] provider you are looking for under [facility] and page to a number that you can be directly reached. If you still have difficulty reaching the provider, please page the [facility] (Director on Call) for the Hospitalists listed on amion for assistance.  03/17/2024, 2:07 AM

## 2024-03-17 NOTE — Consult Note (Signed)
 Reason for Consult:  Incarcerated incisional hernia Referring Provider: Haze, MD  HPI  Denise Hudson is an 63 y.o. female with history of pyelonephritis and prior appendectomy who presents to ED with RLQ abdominal pain.   Pain reported to be severe initially and associated with nausea and emesis. No fevers/chills. On exam her pain is mild to moderate but improved. No leukocytosis. Lactic acid is elevated slight to 2.1 from 1.8. She was found to be in SVT in ED and given adenosine.  CT scan shows incisional hernia with inflamed mesenteric fat, fluid and short segment of thickened small bowel with narrowing as it enters hernia.  10 point review of systems is negative except as listed above in HPI.  Objective  Past Medical History: Past Medical History:  Diagnosis Date   Constipation    History of acute pyelonephritis 07/12/2022   secondary to uropathy obstruction due to kidney stones   History of kidney stones    Microcytic anemia 07/12/2022   Nephrolithiasis    left side calculi non-obstructive per CT 07-18-2022   Ureteral calculi 07/12/2022   right side   Wears contact lenses     Past Surgical History: Past Surgical History:  Procedure Laterality Date   APPENDECTOMY  age 28   CYSTOSCOPY WITH STENT PLACEMENT Right 02/10/2017   Procedure: CYSTOSCOPY WITH RIGHT STENT PLACEMENT;  Surgeon: Devere Lonni Righter, MD;  Location: [facility];  Service: Urology;  Laterality: Right;   CYSTOSCOPY/URETEROSCOPY/HOLMIUM LASER/STENT PLACEMENT Right 03/04/2017   Procedure: CYSTOSCOPY/URETEROSCOPY/HOLMIUM LASER/STENT PLACEMENT;  Surgeon: Devere Lonni Righter, MD;  Location: [facility];  Service: Urology;  Laterality: Right;  NEEDS 30 MIN FOR PROCEDURE   CYSTOSCOPY/URETEROSCOPY/HOLMIUM LASER/STENT PLACEMENT Right 07/13/2022   Procedure: CYSTOSCOPY RIGHT URETERAL STENT PLACEMENT;  Surgeon: Carolee Sherwood JONETTA DOUGLAS, MD;  Location: [facility];  Service: Urology;  Laterality: Right;    CYSTOSCOPY/URETEROSCOPY/HOLMIUM LASER/STENT PLACEMENT Right 07/23/2022   Procedure: CYSTOSCOPY RIGHT URETEROSCOPY, HOLMIUM LASER LITHOTRIPSY, AND RIGHT URETERAL STENT PLACEMENT;  Surgeon: Devere Lonni Righter, MD;  Location: [facility];  Service: Urology;  Laterality: Right;  90 MINUTES   EXTRACORPOREAL SHOCK WAVE LITHOTRIPSY  2008   IR LUMBAR DISC ASPIRATION W/IMG GUIDE  08/12/2022    Family History:  History reviewed. No pertinent family history.  Social History:  reports that she quit smoking about 10 years ago. Her smoking use included cigarettes. She started smoking about 30 years ago. She has never used smokeless tobacco. She reports that she does not drink alcohol and does not use drugs.  Allergies: No Known Allergies  Medications: I have reviewed the patient's current medications.  Labs: I have personally reviewed all labs for the past 24h  Imaging: I have personally reviewed and interpreted all imaging for the past 24h and agree with the radiologist's impression.  DG Chest 2 View Result Date: 03/17/2024 EXAM: 2 VIEW(S) XRAY OF THE CHEST 03/17/2024 12:30:50 AM COMPARISON: None available. CLINICAL HISTORY: abd pain FINDINGS: LINES, TUBES AND DEVICES: Pacer/resuscitation pads over left chest. LUNGS AND PLEURA: No focal pulmonary opacity. No pleural effusion. No pneumothorax. HEART AND MEDIASTINUM: Cardiac size is within normal limits. No acute abnormality of the mediastinal silhouette. BONES AND SOFT TISSUES: No acute osseous abnormality. IMPRESSION: 1. No acute findings. Electronically signed by: Dorethia Molt MD 03/17/2024 12:44 AM EST RP Workstation: [id number]   CT ABDOMEN PELVIS W CONTRAST Result Date: 03/16/2024 CLINICAL DATA:  Hard knot to her lower right abdomen. EXAM: CT ABDOMEN AND PELVIS WITH CONTRAST TECHNIQUE: Multidetector  CT imaging of the abdomen and pelvis was performed using the standard protocol following bolus administration of intravenous  contrast. RADIATION DOSE REDUCTION: This exam was performed according to the departmental dose-optimization program which includes automated exposure control, adjustment of the mA and/or kV according to patient size and/or use of iterative reconstruction technique. CONTRAST:  75mL OMNIPAQUE  IOHEXOL  350 MG/ML SOLN COMPARISON:  August 10, 2022 FINDINGS: Lower chest: No acute abnormality. Hepatobiliary: No focal liver abnormality is seen. No gallstones, gallbladder wall thickening, or biliary dilatation. Pancreas: Unremarkable. No pancreatic ductal dilatation or surrounding inflammatory changes. Spleen: Several tiny, stable foci of parenchymal low attenuation are seen scattered throughout the spleen. Adrenals/Urinary Tract: Adrenal glands are unremarkable. Kidneys are normal in size. A 4.2 cm simple cyst is seen within the mid right kidney. A 3 mm nonobstructing renal calculus is seen within the lower pole of the right kidney. A 2 mm nonobstructing renal calculus is present within the mid to lower left kidney. Bladder is unremarkable. Stomach/Bowel: There is a small hiatal hernia. The appendix is not identified. Multiple dilated small bowel loops are seen throughout the abdomen and pelvis (maximum small bowel diameter of approximately 3.1 cm). A transition zone is seen as a loop of small bowel enters within a lateral ventral hernia seen along the mid to lower right abdominal wall (see below). Multiple surgical clips are seen within the pelvis on the right. Vascular/Lymphatic: Aortic atherosclerosis. No enlarged abdominal or pelvic lymph nodes. Reproductive: Uterus and bilateral adnexa are unremarkable. Other: There is a 3.8 cm x 8.1 cm x 9.7 cm ventral hernia seen along the mid to lower abdomen on the right. This contains moderately inflamed mesenteric fat, fluid and a short segment mildly thickened small bowel that abruptly narrows as it enters the hernia. No abdominopelvic fluid Musculoskeletal: Multilevel chronic and  degenerative changes are seen throughout the lumbar spine. IMPRESSION: 1. Small bowel obstruction with a transition zone seen as a loop of small bowel enters within a lateral ventral hernia seen along the mid to lower abdomen on the right. 2. Short segment of thickened and inflamed small bowel within the previously noted right lateral ventral hernia. An early closed loop small bowel obstruction cannot be excluded. 3. Bilateral subcentimeter nonobstructing renal calculi. 4. Small hiatal hernia. 5. 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 6. Aortic atherosclerosis. Electronically Signed   By: Suzen Dials M.D.   On: 03/16/2024 23:20     Physical Exam Blood pressure 139/70, pulse 66, temperature 98 F (36.7 C), temperature source Oral, resp. rate 15, height 6' 1 (1.854 m), weight 67 kg, last menstrual period 09/23/2015, SpO2 96%. General: no acute distress HEENT: normocephalic, atraumatic Oropharynx: mucous membranes dry CV: Regular rate and rhythm, hypertensive Chest: equal chest rise bilaterally normal respiratory effort on room air Abdomen: Mild tenderness to palpation of RLQ with hernia appreciated containing bowel. No reducible. No tenderness except overlying hernia. No rebound or guarding. Remainder of abdomen soft Extremities: moves all extremities Skin: warm, dry, no rashes Psych: normal memory, normal mood/affect  Neuro: No focal neurologic deficits, A&Ox3    Assessment   Denise Hudson is an 63 y.o. female with incarcerated incisional hernia  Plan  - Will proceed to OR emergently for incisional hernia repair - We discussed the risks of operation including but not limited to: bleeding, deep and superficial surgical site infections including abscess, injury to surrounding structures with need for repair, reoperation, hernia, leak, possible ostomy,  and possible temporary closure. All  questions were answered. Patient voiced understanding and agreed to  proceed with surgery.  - Bedside RN to attempt placement of NGT again.   I reviewed nursing notes, ED provider notes, hospitalist notes, last 24 h vitals and pain scores, last 48 h intake and output, last 24 h labs and trends, and last 24 h imaging results. Discussed plan of care with [facility] directly.  This care required high  level of medical decision making.    Orie Silversmith, MD General Surgery, Surgical Critical Care and Trauma

## 2024-03-17 NOTE — ED Notes (Signed)
 Pt preference is not to attempt NG tube placement for a 2nd time due to not tolerating it on first attempt.

## 2024-03-17 NOTE — Transfer of Care (Signed)
 Immediate Anesthesia Transfer of Care Note  Patient: Denise Hudson  Procedure(s) Performed: LAPAROTOMY, EXPLORATORY SMALL BOWEL RESECTION WOUND EXPLORATION (Abdomen)  Patient Location: PACU  Anesthesia Type:General  Level of Consciousness: awake and drowsy  Airway & Oxygen Therapy: Patient Spontanous Breathing and Patient connected to face mask oxygen  Post-op Assessment: Report given to RN, Post -op Vital signs reviewed and stable, and Patient moving all extremities  Post vital signs: Reviewed and stable  Last Vitals:  Vitals Value Taken Time  BP 185/76 03/17/24 07:52  Temp    Pulse 73 03/17/24 07:56  Resp 19 03/17/24 07:56  SpO2 100 % 03/17/24 07:56  Vitals shown include unfiled device data.  Last Pain:  Vitals:   03/17/24 0137  TempSrc: Oral  PainSc: 0-No pain         Complications: No notable events documented.

## 2024-03-17 NOTE — Progress Notes (Addendum)
  PROGRESS NOTE    Denise Hudson  FMW:[id number] DOB: 06/04/1960 DOA: 03/16/2024 PCP: Pcp, No   Same day admission note:    63 y.o. 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  CT abdomen pelvis showing SBO and incarcerated hernia.  General surgery consulted. S/p exploratory laparotomy and repair of incarcerated incisional hernia. NGT is in place. She was nauseous after the surgery.   Deliliah Room, MD [facility]  If 7PM-7AM, please contact night-coverage  03/17/2024, 11:14 AM

## 2024-03-17 NOTE — ED Notes (Signed)
 Patient transported to X-ray

## 2024-03-17 NOTE — Anesthesia Preprocedure Evaluation (Signed)
 Anesthesia Evaluation  Patient identified by MRN, date of birth, ID band Patient awake    Reviewed: Allergy & Precautions, H&P , NPO status , Patient's Chart, lab work & pertinent test results  History of Anesthesia Complications Negative for: history of anesthetic complications  Airway Mallampati: II  TM Distance: >3 FB Neck ROM: Full    Dental no notable dental hx.    Pulmonary former smoker   Pulmonary exam normal breath sounds clear to auscultation       Cardiovascular hypertension, Normal cardiovascular exam+ dysrhythmias Supra Ventricular Tachycardia  Rhythm:Regular Rate:Normal  S/p adenosine  push for SVT in ED on arrival    Neuro/Psych neg Seizures negative neurological ROS  negative psych ROS   GI/Hepatic Neg liver ROS,,,Incarcerated intra-abdominal hernia   Endo/Other  negative endocrine ROS    Renal/GU Hx of nephrolithiasis, pyelonephritis   negative genitourinary   Musculoskeletal  (+) Arthritis ,    Abdominal   Peds negative pediatric ROS (+)  Hematology negative hematology ROS (+)   Anesthesia Other Findings   Reproductive/Obstetrics negative OB ROS                              Anesthesia Physical Anesthesia Plan  ASA: 3 and emergent  Anesthesia Plan: General   Post-op Pain Management:    Induction: Intravenous and Rapid sequence  PONV Risk Score and Plan: 3 and Ondansetron , Dexamethasone  and Treatment may vary due to age or medical condition  Airway Management Planned: Oral ETT  Additional Equipment: None  Intra-op Plan:   Post-operative Plan: Extubation in OR  Informed Consent: I have reviewed the patients History and Physical, chart, labs and discussed the procedure including the risks, benefits and alternatives for the proposed anesthesia with the patient or authorized representative who has indicated his/her understanding and acceptance.     Dental  advisory given  Plan Discussed with: CRNA  Anesthesia Plan Comments:         Anesthesia Quick Evaluation

## 2024-03-18 ENCOUNTER — Encounter (HOSPITAL_COMMUNITY): Payer: Self-pay | Admitting: General Surgery

## 2024-03-18 DIAGNOSIS — I159 Secondary hypertension, unspecified: Secondary | ICD-10-CM | POA: Diagnosis not present

## 2024-03-18 DIAGNOSIS — K460 Unspecified abdominal hernia with obstruction, without gangrene: Secondary | ICD-10-CM | POA: Diagnosis not present

## 2024-03-18 DIAGNOSIS — I10 Essential (primary) hypertension: Secondary | ICD-10-CM | POA: Diagnosis not present

## 2024-03-18 DIAGNOSIS — I471 Supraventricular tachycardia, unspecified: Secondary | ICD-10-CM | POA: Diagnosis not present

## 2024-03-18 LAB — ECHOCARDIOGRAM COMPLETE
AR max vel: 2.18 cm2
AV Area VTI: 2.23 cm2
AV Area mean vel: 2.2 cm2
AV Mean grad: 5 mmHg
AV Peak grad: 9.2 mmHg
Ao pk vel: 1.52 m/s
Area-P 1/2: 2.65 cm2
Height: 73 in
S' Lateral: 3.3 cm
Weight: 2363.33 [oz_av]

## 2024-03-18 MED ORDER — LABETALOL HCL 5 MG/ML IV SOLN
10.0000 mg | Freq: Once | INTRAVENOUS | Status: AC
  Administered 2024-03-18: 10 mg via INTRAVENOUS
  Filled 2024-03-18: qty 4

## 2024-03-18 MED ORDER — LACTATED RINGERS IV SOLN: INTRAVENOUS | Status: DC

## 2024-03-18 MED ORDER — POTASSIUM CHLORIDE 10 MEQ/100ML IV SOLN
10.0000 meq | INTRAVENOUS | Status: AC
Start: 2024-03-18 — End: 2024-03-18
  Administered 2024-03-18 (×2): 10 meq via INTRAVENOUS
  Filled 2024-03-18 (×2): qty 100

## 2024-03-18 MED ORDER — METOPROLOL TARTRATE 5 MG/5ML IV SOLN
5.0000 mg | Freq: Four times a day (QID) | INTRAVENOUS | Status: DC
  Administered 2024-03-18 – 2024-03-20 (×8): 5 mg via INTRAVENOUS
  Filled 2024-03-18 (×9): qty 5

## 2024-03-18 MED ORDER — HYDRALAZINE HCL 20 MG/ML IJ SOLN
10.0000 mg | Freq: Four times a day (QID) | INTRAMUSCULAR | Status: DC | PRN
  Administered 2024-03-18 – 2024-03-19 (×2): 10 mg via INTRAVENOUS
  Filled 2024-03-18 (×2): qty 1

## 2024-03-18 MED ORDER — ENOXAPARIN SODIUM 40 MG/0.4ML IJ SOSY
40.0000 mg | PREFILLED_SYRINGE | INTRAMUSCULAR | Status: DC
  Administered 2024-03-18 – 2024-03-29 (×12): 40 mg via SUBCUTANEOUS
  Filled 2024-03-18 (×12): qty 0.4

## 2024-03-18 NOTE — Progress Notes (Signed)
 Progress Note  1 Day Post-Op  Subjective: Patient reports abdominal soreness that is manageable. Has not had BM or flatulence since surgery. Denies nausea and vomiting.   ROS  All negative with the exception of above.  Objective: Vital signs in last 24 hours: Temp:  [98 F (36.7 C)-98.7 F (37.1 C)] 98.7 F (37.1 C) (11/21 0424) Pulse Rate:  [59-77] 60 (11/21 0801) Resp:  [9-24] 11 (11/21 0801) BP: (160-204)/(62-92) 178/72 (11/21 0801) SpO2:  [94 %-99 %] 94 % (11/21 0801)    Intake/Output from previous day: 11/20 0701 - 11/21 0700 In: 1500 [I.V.:1000; IV Piggyback:500] Out: 1350 [Urine:1300; Blood:50] Intake/Output this shift: Total I/O In: -  Out: 100 [Emesis/NG output:100]  PE: General: Pleasant female who is laying in bed in NAD. HEENT: Head is normocephalic, atraumatic. NGT in place with minimal amount of green output in cannister.  Heart: HR normal during encounter.  Lungs: Respiratory effort nonlabored. Abd: Soft. ND. Generalized tenderness to palpation. Abdominal binder present. Midline incision present with staples that is C/D/I. Honeycomb dressing covering. Right lower abdominal incision with staples that is C/D/I covered with additional honeycomb dressing. +BS. No rebound tenderness or guarding.  MS: Able to move extremities.  Skin: Warm and dry. Psych: A&Ox3 with an appropriate affect.    Lab Results:  Recent Labs    03/16/24 2215 03/16/24 2222 03/17/24 0050  WBC 7.3  --  5.9  HGB 15.2* 18.0* 13.7  HCT 48.2* 53.0* 43.9  PLT 245  --  231   BMET Recent Labs    03/16/24 2215 03/16/24 2222 03/17/24 0050  NA 137 138 137  K 3.5 3.5 3.4*  CL 99 103 101  CO2 18*  --  23  GLUCOSE 130* 134* 113*  BUN 50* 52* 45*  CREATININE 1.31* 1.50* 1.18*  CALCIUM 9.6  --  8.4*   PT/INR No results for input(s): LABPROT, INR in the last 72 hours. CMP     Component Value Date/Time   NA 137 03/17/2024 0050   K 3.4 (L) 03/17/2024 0050   CL 101  03/17/2024 0050   CO2 23 03/17/2024 0050   GLUCOSE 113 (H) 03/17/2024 0050   BUN 45 (H) 03/17/2024 0050   CREATININE 1.18 (H) 03/17/2024 0050   CREATININE 0.91 11/06/2022 0148   CALCIUM 8.4 (L) 03/17/2024 0050   PROT 7.0 03/17/2024 0050   ALBUMIN  3.2 (L) 03/17/2024 0050   AST 50 (H) 03/17/2024 0050   ALT 44 03/17/2024 0050   ALKPHOS 103 03/17/2024 0050   BILITOT 1.7 (H) 03/17/2024 0050   GFRNONAA 53 (L) 03/17/2024 0050   GFRAA 58 (L) 10/18/2019 1255   Lipase     Component Value Date/Time   LIPASE 34 03/16/2024 2215       Studies/Results: ECHOCARDIOGRAM COMPLETE Result Date: 03/18/2024    ECHOCARDIOGRAM REPORT   Patient Name:   TUNYA HELD Date of Exam: 03/17/2024 Medical Rec #:  [id number]     Height:       73.0 in Accession #:    [id number]    Weight:       147.7 lb Date of Birth:  December 18, 1960     BSA:          1.892 m Patient Age:    63 years      BP:           171/69 mmHg Patient Gender: F             HR:  58 bpm. Exam Location:  Inpatient Procedure: 2D Echo, Cardiac Doppler and Color Doppler (Both Spectral and Color            Flow Doppler were utilized during procedure). Indications:    SVT (supraventricular tachycardia)  History:        Patient has no prior history of Echocardiogram examinations.                 Risk Factors:Hypertension.  Sonographer:    Philomena Daring Referring Phys: [id number] SUBRINA SUNDIL IMPRESSIONS  1. Left ventricular ejection fraction, by estimation, is 60 to 65%. The left ventricle has normal function. The left ventricle has no regional wall motion abnormalities. There is mild concentric left ventricular hypertrophy. Left ventricular diastolic parameters are consistent with Grade I diastolic dysfunction (impaired relaxation).  2. Right ventricular systolic function is normal. The right ventricular size is normal.  3. The mitral valve is normal in structure. Trivial mitral valve regurgitation. Moderate mitral stenosis.  4. The aortic valve is  tricuspid. There is mild calcification of the aortic valve. Aortic valve regurgitation is not visualized. Aortic valve sclerosis/calcification is present, without any evidence of aortic stenosis. FINDINGS  Left Ventricle: Left ventricular ejection fraction, by estimation, is 60 to 65%. The left ventricle has normal function. The left ventricle has no regional wall motion abnormalities. The left ventricular internal cavity size was normal in size. There is  mild concentric left ventricular hypertrophy. Left ventricular diastolic parameters are consistent with Grade I diastolic dysfunction (impaired relaxation). Right Ventricle: The right ventricular size is normal. No increase in right ventricular wall thickness. Right ventricular systolic function is normal. Left Atrium: Left atrial size was normal in size. Right Atrium: Right atrial size was normal in size. Pericardium: There is no evidence of pericardial effusion. Mitral Valve: The mitral valve is normal in structure. Trivial mitral valve regurgitation. Moderate mitral valve stenosis. Tricuspid Valve: The tricuspid valve is normal in structure. Tricuspid valve regurgitation is trivial. No evidence of tricuspid stenosis. Aortic Valve: The aortic valve is tricuspid. There is mild calcification of the aortic valve. Aortic valve regurgitation is not visualized. Aortic valve sclerosis/calcification is present, without any evidence of aortic stenosis. Aortic valve mean gradient measures 5.0 mmHg. Aortic valve peak gradient measures 9.2 mmHg. Aortic valve area, by VTI measures 2.23 cm. Pulmonic Valve: The pulmonic valve was normal in structure. Pulmonic valve regurgitation is trivial. No evidence of pulmonic stenosis. Aorta: The aortic root is normal in size and structure. Venous: The inferior vena cava was not well visualized. IAS/Shunts: No atrial level shunt detected by color flow Doppler.  LEFT VENTRICLE PLAX 2D LVIDd:         4.70 cm   Diastology LVIDs:          3.30 cm   LV e' medial:    7.18 cm/s LV PW:         1.20 cm   LV E/e' medial:  9.5 LV IVS:        1.20 cm   LV e' lateral:   6.20 cm/s LVOT diam:     2.10 cm   LV E/e' lateral: 11.0 LV SV:         71 LV SV Index:   38 LVOT Area:     3.46 cm  RIGHT VENTRICLE RV Basal diam:  3.10 cm RV Mid diam:    2.30 cm RV S prime:     11.20 cm/s TAPSE (M-mode): 3.2 cm LEFT ATRIUM  Index        RIGHT ATRIUM           Index LA diam:        2.90 cm 1.53 cm/m   RA Area:     18.20 cm LA Vol (A2C):   46.8 ml 24.74 ml/m  RA Volume:   43.50 ml  23.00 ml/m LA Vol (A4C):   51.0 ml 26.96 ml/m LA Biplane Vol: 51.7 ml 27.33 ml/m  AORTIC VALVE AV Area (Vmax):    2.18 cm AV Area (Vmean):   2.20 cm AV Area (VTI):     2.23 cm AV Vmax:           152.00 cm/s AV Vmean:          100.000 cm/s AV VTI:            0.320 m AV Peak Grad:      9.2 mmHg AV Mean Grad:      5.0 mmHg LVOT Vmax:         95.80 cm/s LVOT Vmean:        63.600 cm/s LVOT VTI:          0.206 m LVOT/AV VTI ratio: 0.64  AORTA Ao Root diam: 2.60 cm Ao Asc diam:  2.90 cm MITRAL VALVE                TRICUSPID VALVE MV Area (PHT): 2.65 cm     TR Peak grad:   6.2 mmHg MV Decel Time: 286 msec     TR Vmax:        124.00 cm/s MV E velocity: 68.30 cm/s MV A velocity: 118.00 cm/s  SHUNTS MV E/A ratio:  0.58         Systemic VTI:  0.21 m                             Systemic Diam: 2.10 cm Toribio Fuel MD Electronically signed by Toribio Fuel MD Signature Date/Time: 03/18/2024/12:10:56 AM    Final    DG CHEST PORT 1 VIEW Result Date: 03/17/2024 CLINICAL DATA:  Nasogastric tube evaluation. EXAM: PORTABLE CHEST 1 VIEW COMPARISON:  03/17/2024 at 12:25 a.m. FINDINGS: Interval placement of nasogastric tube which courses into the region of the stomach and off the inferior aspect of the images tip is not visualized. Lungs are adequately inflated without effusion. Subtle bibasilar density likely atelectasis, although early infection is possible. Cardiomediastinal silhouette  and remainder of the exam is unchanged. IMPRESSION: 1. Interval placement of nasogastric tube which courses into the region of the stomach and off the image as tip is not visualized. 2. Subtle bibasilar density likely atelectasis, although early infection is possible. Electronically Signed   By: Toribio Agreste M.D.   On: 03/17/2024 15:42   DG Chest 2 View Result Date: 03/17/2024 EXAM: 2 VIEW(S) XRAY OF THE CHEST 03/17/2024 12:30:50 AM COMPARISON: None available. CLINICAL HISTORY: abd pain FINDINGS: LINES, TUBES AND DEVICES: Pacer/resuscitation pads over left chest. LUNGS AND PLEURA: No focal pulmonary opacity. No pleural effusion. No pneumothorax. HEART AND MEDIASTINUM: Cardiac size is within normal limits. No acute abnormality of the mediastinal silhouette. BONES AND SOFT TISSUES: No acute osseous abnormality. IMPRESSION: 1. No acute findings. Electronically signed by: Dorethia Molt MD 03/17/2024 12:44 AM EST RP Workstation: [id number]   CT ABDOMEN PELVIS W CONTRAST Result Date: 03/16/2024 CLINICAL DATA:  Hard knot to her lower right abdomen. EXAM: CT ABDOMEN AND PELVIS WITH  CONTRAST TECHNIQUE: Multidetector CT imaging of the abdomen and pelvis was performed using the standard protocol following bolus administration of intravenous contrast. RADIATION DOSE REDUCTION: This exam was performed according to the departmental dose-optimization program which includes automated exposure control, adjustment of the mA and/or kV according to patient size and/or use of iterative reconstruction technique. CONTRAST:  75mL OMNIPAQUE  IOHEXOL  350 MG/ML SOLN COMPARISON:  August 10, 2022 FINDINGS: Lower chest: No acute abnormality. Hepatobiliary: No focal liver abnormality is seen. No gallstones, gallbladder wall thickening, or biliary dilatation. Pancreas: Unremarkable. No pancreatic ductal dilatation or surrounding inflammatory changes. Spleen: Several tiny, stable foci of parenchymal low attenuation are seen scattered  throughout the spleen. Adrenals/Urinary Tract: Adrenal glands are unremarkable. Kidneys are normal in size. A 4.2 cm simple cyst is seen within the mid right kidney. A 3 mm nonobstructing renal calculus is seen within the lower pole of the right kidney. A 2 mm nonobstructing renal calculus is present within the mid to lower left kidney. Bladder is unremarkable. Stomach/Bowel: There is a small hiatal hernia. The appendix is not identified. Multiple dilated small bowel loops are seen throughout the abdomen and pelvis (maximum small bowel diameter of approximately 3.1 cm). A transition zone is seen as a loop of small bowel enters within a lateral ventral hernia seen along the mid to lower right abdominal wall (see below). Multiple surgical clips are seen within the pelvis on the right. Vascular/Lymphatic: Aortic atherosclerosis. No enlarged abdominal or pelvic lymph nodes. Reproductive: Uterus and bilateral adnexa are unremarkable. Other: There is a 3.8 cm x 8.1 cm x 9.7 cm ventral hernia seen along the mid to lower abdomen on the right. This contains moderately inflamed mesenteric fat, fluid and a short segment mildly thickened small bowel that abruptly narrows as it enters the hernia. No abdominopelvic fluid Musculoskeletal: Multilevel chronic and degenerative changes are seen throughout the lumbar spine. IMPRESSION: 1. Small bowel obstruction with a transition zone seen as a loop of small bowel enters within a lateral ventral hernia seen along the mid to lower abdomen on the right. 2. Short segment of thickened and inflamed small bowel within the previously noted right lateral ventral hernia. An early closed loop small bowel obstruction cannot be excluded. 3. Bilateral subcentimeter nonobstructing renal calculi. 4. Small hiatal hernia. 5. 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 6. Aortic atherosclerosis. Electronically Signed   By: Suzen Dials M.D.   On: 03/16/2024 23:20     Anti-infectives: Anti-infectives (From admission, onward)    Start     Dose/Rate Route Frequency Ordered Stop   03/17/24 0600  piperacillin -tazobactam (ZOSYN ) IVPB 3.375 g        3.375 g 12.5 mL/hr over 240 Minutes Intravenous Every 8 hours 03/17/24 0018     03/17/24 0030  piperacillin -tazobactam (ZOSYN ) IVPB 3.375 g        3.375 g 100 mL/hr over 30 Minutes Intravenous  Once 03/17/24 0018 03/17/24 0134        Assessment/Plan POD1: LAPAROTOMY, EXPLORATORY SMALL BOWEL RESECTION WOUND EXPLORATION (N/A) - Repair of incarcerated Incisional hernia by Dr. Richerd Silversmith on 11/20 -Hypertensive.  -Pain manageable. No BM or flatulence. Denies nausea and vomiting.  -NGT output noted to be 100 mL this morning. Will discuss with attending and consider clamp trial. -Multi-modal pain control. -Keep NPO at this time -Will continue to follow.  FEN: NPO/NGT; IVF per primary team VTE: None currently ID: [email or web address]     LOS: 1 day   I reviewed nursing notes, specialist  notes, hospitalist notes, last 24 h vitals and pain scores, last 48 h intake and output, last 24 h labs and trends, and last 24 h imaging results.   Marjorie Carlyon Favre, [facility] 03/18/2024, 9:06 AM Please see Amion for pager number during day hours 7:00am-4:30pm

## 2024-03-18 NOTE — Progress Notes (Signed)
 PROGRESS NOTE        PATIENT DETAILS Name: LATIYA NAVIA Age: 63 y.o. Sex: female Date of Birth: 10-15-1960 Admit Date: 03/16/2024 Admitting Physician Micaela Speaker, MD PCP:Pcp, No  Brief Summary: Patient is a 63 y.o.  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  Significant events: 11/20>> admit to [facility]  Significant studies: 11/19>> CT abdomen/pelvis: SBO-transition zone seen as a loop of small bowel enters lateral  ventral hernia. 11/20>> echo: EF 60-65%  Significant microbiology data: 11/20>> blood culture: No growth  Procedures: 11/20>> expiratory laparotomy with small bowel resection.  Consults: None  Subjective: Pain at the operative site but appears comfortable.  Objective: Vitals: Blood pressure (!) 178/72, pulse 60, temperature 98.6 F (37 C), temperature source Oral, resp. rate 11, height 6' 1 (1.854 m), weight 67 kg, last menstrual period 09/23/2015, SpO2 94%.   Exam: Gen Exam:Alert awake-not in any distress HEENT:atraumatic, normocephalic Chest: B/L clear to auscultation anteriorly CVS:S1S2 regular Abdomen: Abdominal binder in place-soft-appropriately tender at the incision site.  No peritoneal signs on exam. Extremities:no edema Neurology: Non focal Skin: no rash  Pertinent Labs/Radiology:    Latest Ref Rng & Units 03/17/2024   12:50 AM 03/16/2024   10:22 PM 03/16/2024   10:15 PM  CBC  WBC 4.0 - 10.5 K/uL 5.9   7.3   Hemoglobin 12.0 - 15.0 g/dL 86.2  81.9  84.7   Hematocrit 36.0 - 46.0 % 43.9  53.0  48.2   Platelets 150 - 400 K/uL 231   245     Lab Results  Component Value Date   NA 137 03/17/2024   K 3.4 (L) 03/17/2024   CL 101 03/17/2024   CO2 23  03/17/2024      Assessment/Plan: SBO secondary to incarcerated incisional hernia S/p exploratory laparotomy/bowel resection by general surgery on 11/20 NG tube remains in place On IV fluids General Surgery following-will defer postoperative care to general surgery.  Paroxysmal supraventricular tachycardia Seen on initial presentation in the ED Given 2 doses of adenosine  with restoration of sinus rhythm Echo/TSH stable Will start IV metoprolol -as n.p.o.-with plans to switch to oral metoprolol  when oral intake resumes.  HTN BP elevated Could be secondary to postoperative pain Starting scheduled IV metoprolol  As needed IV hydralazine   AKI Mild Likely hemodynamically mediated Improving with supportive care.  Hypokalemia Replete/recheck  Code status:   Code Status: Full Code   DVT Prophylaxis: SCDs Start: 03/17/24 0023 Place TED hose Start: 03/17/24 0023   Family Communication: None at bedside   Disposition Plan: Status is: Inpatient Remains inpatient appropriate because: Severity of illness   Planned Discharge Destination:Home   Diet: Diet Order             Diet NPO time specified  Diet effective now                     Antimicrobial agents: Anti-infectives (From admission, onward)    Start     Dose/Rate Route Frequency Ordered Stop   03/17/24 0600  piperacillin -tazobactam (ZOSYN ) IVPB 3.375 g        3.375 g 12.5 mL/hr over 240 Minutes Intravenous Every 8 hours 03/17/24 0018  03/17/24 0030  piperacillin -tazobactam (ZOSYN ) IVPB 3.375 g        3.375 g 100 mL/hr over 30 Minutes Intravenous  Once 03/17/24 0018 03/17/24 0134        MEDICATIONS: Scheduled Meds:  Chlorhexidine  Gluconate Cloth  6 each Topical Daily   methocarbamol  (ROBAXIN ) injection  500 mg Intravenous Q8H   metoprolol  tartrate  5 mg Intravenous Q6H   sodium chloride  flush  3 mL Intravenous Q12H   Continuous Infusions:  lactated ringers  75 mL/hr at 03/18/24 0751    piperacillin -tazobactam (ZOSYN )  IV 3.375 g (03/18/24 0602)   potassium chloride  10 mEq (03/18/24 0934)   PRN Meds:.acetaminophen  **OR** acetaminophen , fentaNYL  (SUBLIMAZE ) injection, hydrALAZINE , morphine  injection, ondansetron  **OR** ondansetron  (ZOFRAN ) IV, phenol, sodium chloride  flush   I have personally reviewed following labs and imaging studies  LABORATORY DATA: CBC: Recent Labs  Lab 03/16/24 2215 03/16/24 2222 03/17/24 0050  WBC 7.3  --  5.9  HGB 15.2* 18.0* 13.7  HCT 48.2* 53.0* 43.9  MCV 79.0*  --  80.0  PLT 245  --  231    Basic Metabolic Panel: Recent Labs  Lab 03/16/24 2215 03/16/24 2222 03/17/24 0050  NA 137 138 137  K 3.5 3.5 3.4*  CL 99 103 101  CO2 18*  --  23  GLUCOSE 130* 134* 113*  BUN 50* 52* 45*  CREATININE 1.31* 1.50* 1.18*  CALCIUM 9.6  --  8.4*    GFR: Estimated Creatinine Clearance: 53 mL/min (A) (by C-G formula based on SCr of 1.18 mg/dL (H)).  Liver Function Tests: Recent Labs  Lab 03/16/24 2215 03/17/24 0050  AST 60* 50*  ALT 52* 44  ALKPHOS 119 103  BILITOT 2.1* 1.7*  PROT 7.9 7.0  ALBUMIN  3.8 3.2*   Recent Labs  Lab 03/16/24 2215  LIPASE 34   No results for input(s): AMMONIA in the last 168 hours.  Coagulation Profile: No results for input(s): INR, PROTIME in the last 168 hours.  Cardiac Enzymes: No results for input(s): CKTOTAL, CKMB, CKMBINDEX, TROPONINI in the last 168 hours.  BNP (last 3 results) No results for input(s): PROBNP in the last 8760 hours.  Lipid Profile: No results for input(s): CHOL, HDL, LDLCALC, TRIG, CHOLHDL, LDLDIRECT in the last 72 hours.  Thyroid Function Tests: Recent Labs    03/16/24 2310  TSH 3.054    Anemia Panel: No results for input(s): VITAMINB12, FOLATE, FERRITIN, TIBC, IRON, RETICCTPCT in the last 72 hours.  Urine analysis:    Component Value Date/Time   COLORURINE YELLOW 03/16/2024 2359   APPEARANCEUR HAZY (A) 03/16/2024 2359    LABSPEC 1.040 (H) 03/16/2024 2359   PHURINE 6.0 03/16/2024 2359   GLUCOSEU NEGATIVE 03/16/2024 2359   HGBUR SMALL (A) 03/16/2024 2359   BILIRUBINUR NEGATIVE 03/16/2024 2359   KETONESUR NEGATIVE 03/16/2024 2359   PROTEINUR 30 (A) 03/16/2024 2359   UROBILINOGEN 1.0 01/02/2015 0043   NITRITE NEGATIVE 03/16/2024 2359   LEUKOCYTESUR LARGE (A) 03/16/2024 2359    Sepsis Labs: Lactic Acid, Venous    Component Value Date/Time   LATICACIDVEN 2.1 (HH) 03/17/2024 0026    MICROBIOLOGY: Recent Results (from the past 240 hours)  Culture, blood (Routine X 2) w Reflex to ID Panel     Status: None (Preliminary result)   Collection Time: 03/17/24 12:40 AM   Specimen: BLOOD RIGHT ARM  Result Value Ref Range Status   Specimen Description BLOOD RIGHT ARM  Final   Special Requests   Final    BOTTLES DRAWN AEROBIC  AND ANAEROBIC Blood Culture adequate volume   Culture   Final    NO GROWTH 1 DAY Performed at [facility], [address], [place], [place] [address]    Report Status PENDING  Incomplete  Culture, blood (Routine X 2) w Reflex to ID Panel     Status: None (Preliminary result)   Collection Time: 03/17/24 12:50 AM   Specimen: BLOOD  Result Value Ref Range Status   Specimen Description BLOOD LEFT ANTECUBITAL  Final   Special Requests   Final    BOTTLES DRAWN AEROBIC AND ANAEROBIC Blood Culture adequate volume   Culture   Final    NO GROWTH 1 DAY Performed at [facility], [address], [place], [place] [address]    Report Status PENDING  Incomplete    RADIOLOGY STUDIES/RESULTS: ECHOCARDIOGRAM COMPLETE Result Date: 03/18/2024    ECHOCARDIOGRAM REPORT   Patient Name:   TORAH PINNOCK Date of Exam: 03/17/2024 Medical Rec #:  [id number]     Height:       73.0 in Accession #:    [id number]    Weight:       147.7 lb Date of Birth:  January 31, 1961     BSA:          1.892 m Patient Age:    63 years      BP:           171/69 mmHg Patient Gender: F             HR:           58 bpm.  Exam Location:  Inpatient Procedure: 2D Echo, Cardiac Doppler and Color Doppler (Both Spectral and Color            Flow Doppler were utilized during procedure). Indications:    SVT (supraventricular tachycardia)  History:        Patient has no prior history of Echocardiogram examinations.                 Risk Factors:Hypertension.  Sonographer:    Philomena Daring Referring Phys: [id number] SUBRINA SUNDIL IMPRESSIONS  1. Left ventricular ejection fraction, by estimation, is 60 to 65%. The left ventricle has normal function. The left ventricle has no regional wall motion abnormalities. There is mild concentric left ventricular hypertrophy. Left ventricular diastolic parameters are consistent with Grade I diastolic dysfunction (impaired relaxation).  2. Right ventricular systolic function is normal. The right ventricular size is normal.  3. The mitral valve is normal in structure. Trivial mitral valve regurgitation. Moderate mitral stenosis.  4. The aortic valve is tricuspid. There is mild calcification of the aortic valve. Aortic valve regurgitation is not visualized. Aortic valve sclerosis/calcification is present, without any evidence of aortic stenosis. FINDINGS  Left Ventricle: Left ventricular ejection fraction, by estimation, is 60 to 65%. The left ventricle has normal function. The left ventricle has no regional wall motion abnormalities. The left ventricular internal cavity size was normal in size. There is  mild concentric left ventricular hypertrophy. Left ventricular diastolic parameters are consistent with Grade I diastolic dysfunction (impaired relaxation). Right Ventricle: The right ventricular size is normal. No increase in right ventricular wall thickness. Right ventricular systolic function is normal. Left Atrium: Left atrial size was normal in size. Right Atrium: Right atrial size was normal in size. Pericardium: There is no evidence of pericardial effusion. Mitral Valve: The mitral valve is normal in  structure. Trivial mitral valve regurgitation. Moderate mitral valve stenosis. Tricuspid  Valve: The tricuspid valve is normal in structure. Tricuspid valve regurgitation is trivial. No evidence of tricuspid stenosis. Aortic Valve: The aortic valve is tricuspid. There is mild calcification of the aortic valve. Aortic valve regurgitation is not visualized. Aortic valve sclerosis/calcification is present, without any evidence of aortic stenosis. Aortic valve mean gradient measures 5.0 mmHg. Aortic valve peak gradient measures 9.2 mmHg. Aortic valve area, by VTI measures 2.23 cm. Pulmonic Valve: The pulmonic valve was normal in structure. Pulmonic valve regurgitation is trivial. No evidence of pulmonic stenosis. Aorta: The aortic root is normal in size and structure. Venous: The inferior vena cava was not well visualized. IAS/Shunts: No atrial level shunt detected by color flow Doppler.  LEFT VENTRICLE PLAX 2D LVIDd:         4.70 cm   Diastology LVIDs:         3.30 cm   LV e' medial:    7.18 cm/s LV PW:         1.20 cm   LV E/e' medial:  9.5 LV IVS:        1.20 cm   LV e' lateral:   6.20 cm/s LVOT diam:     2.10 cm   LV E/e' lateral: 11.0 LV SV:         71 LV SV Index:   38 LVOT Area:     3.46 cm  RIGHT VENTRICLE RV Basal diam:  3.10 cm RV Mid diam:    2.30 cm RV S prime:     11.20 cm/s TAPSE (M-mode): 3.2 cm LEFT ATRIUM             Index        RIGHT ATRIUM           Index LA diam:        2.90 cm 1.53 cm/m   RA Area:     18.20 cm LA Vol (A2C):   46.8 ml 24.74 ml/m  RA Volume:   43.50 ml  23.00 ml/m LA Vol (A4C):   51.0 ml 26.96 ml/m LA Biplane Vol: 51.7 ml 27.33 ml/m  AORTIC VALVE AV Area (Vmax):    2.18 cm AV Area (Vmean):   2.20 cm AV Area (VTI):     2.23 cm AV Vmax:           152.00 cm/s AV Vmean:          100.000 cm/s AV VTI:            0.320 m AV Peak Grad:      9.2 mmHg AV Mean Grad:      5.0 mmHg LVOT Vmax:         95.80 cm/s LVOT Vmean:        63.600 cm/s LVOT VTI:          0.206 m LVOT/AV VTI ratio:  0.64  AORTA Ao Root diam: 2.60 cm Ao Asc diam:  2.90 cm MITRAL VALVE                TRICUSPID VALVE MV Area (PHT): 2.65 cm     TR Peak grad:   6.2 mmHg MV Decel Time: 286 msec     TR Vmax:        124.00 cm/s MV E velocity: 68.30 cm/s MV A velocity: 118.00 cm/s  SHUNTS MV E/A ratio:  0.58         Systemic VTI:  0.21 m  Systemic Diam: 2.10 cm Toribio Fuel MD Electronically signed by Toribio Fuel MD Signature Date/Time: 03/18/2024/12:10:56 AM    Final    DG CHEST PORT 1 VIEW Result Date: 03/17/2024 CLINICAL DATA:  Nasogastric tube evaluation. EXAM: PORTABLE CHEST 1 VIEW COMPARISON:  03/17/2024 at 12:25 a.m. FINDINGS: Interval placement of nasogastric tube which courses into the region of the stomach and off the inferior aspect of the images tip is not visualized. Lungs are adequately inflated without effusion. Subtle bibasilar density likely atelectasis, although early infection is possible. Cardiomediastinal silhouette and remainder of the exam is unchanged. IMPRESSION: 1. Interval placement of nasogastric tube which courses into the region of the stomach and off the image as tip is not visualized. 2. Subtle bibasilar density likely atelectasis, although early infection is possible. Electronically Signed   By: Toribio Agreste M.D.   On: 03/17/2024 15:42   DG Chest 2 View Result Date: 03/17/2024 EXAM: 2 VIEW(S) XRAY OF THE CHEST 03/17/2024 12:30:50 AM COMPARISON: None available. CLINICAL HISTORY: abd pain FINDINGS: LINES, TUBES AND DEVICES: Pacer/resuscitation pads over left chest. LUNGS AND PLEURA: No focal pulmonary opacity. No pleural effusion. No pneumothorax. HEART AND MEDIASTINUM: Cardiac size is within normal limits. No acute abnormality of the mediastinal silhouette. BONES AND SOFT TISSUES: No acute osseous abnormality. IMPRESSION: 1. No acute findings. Electronically signed by: Dorethia Molt MD 03/17/2024 12:44 AM EST RP Workstation: [id number]   CT ABDOMEN PELVIS W  CONTRAST Result Date: 03/16/2024 CLINICAL DATA:  Hard knot to her lower right abdomen. EXAM: CT ABDOMEN AND PELVIS WITH CONTRAST TECHNIQUE: Multidetector CT imaging of the abdomen and pelvis was performed using the standard protocol following bolus administration of intravenous contrast. RADIATION DOSE REDUCTION: This exam was performed according to the departmental dose-optimization program which includes automated exposure control, adjustment of the mA and/or kV according to patient size and/or use of iterative reconstruction technique. CONTRAST:  75mL OMNIPAQUE  IOHEXOL  350 MG/ML SOLN COMPARISON:  August 10, 2022 FINDINGS: Lower chest: No acute abnormality. Hepatobiliary: No focal liver abnormality is seen. No gallstones, gallbladder wall thickening, or biliary dilatation. Pancreas: Unremarkable. No pancreatic ductal dilatation or surrounding inflammatory changes. Spleen: Several tiny, stable foci of parenchymal low attenuation are seen scattered throughout the spleen. Adrenals/Urinary Tract: Adrenal glands are unremarkable. Kidneys are normal in size. A 4.2 cm simple cyst is seen within the mid right kidney. A 3 mm nonobstructing renal calculus is seen within the lower pole of the right kidney. A 2 mm nonobstructing renal calculus is present within the mid to lower left kidney. Bladder is unremarkable. Stomach/Bowel: There is a small hiatal hernia. The appendix is not identified. Multiple dilated small bowel loops are seen throughout the abdomen and pelvis (maximum small bowel diameter of approximately 3.1 cm). A transition zone is seen as a loop of small bowel enters within a lateral ventral hernia seen along the mid to lower right abdominal wall (see below). Multiple surgical clips are seen within the pelvis on the right. Vascular/Lymphatic: Aortic atherosclerosis. No enlarged abdominal or pelvic lymph nodes. Reproductive: Uterus and bilateral adnexa are unremarkable. Other: There is a 3.8 cm x 8.1 cm x 9.7 cm  ventral hernia seen along the mid to lower abdomen on the right. This contains moderately inflamed mesenteric fat, fluid and a short segment mildly thickened small bowel that abruptly narrows as it enters the hernia. No abdominopelvic fluid Musculoskeletal: Multilevel chronic and degenerative changes are seen throughout the lumbar spine. IMPRESSION: 1. Small bowel obstruction with a transition zone seen as  a loop of small bowel enters within a lateral ventral hernia seen along the mid to lower abdomen on the right. 2. Short segment of thickened and inflamed small bowel within the previously noted right lateral ventral hernia. An early closed loop small bowel obstruction cannot be excluded. 3. Bilateral subcentimeter nonobstructing renal calculi. 4. Small hiatal hernia. 5. 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 6. Aortic atherosclerosis. Electronically Signed   By: Suzen Dials M.D.   On: 03/16/2024 23:20     LOS: 1 day   Donalda Applebaum, MD  [facility]    To contact the attending provider between 7A-7P or the covering provider during after hours 7P-7A, please log into the web site [email or web address] and access using universal  password for that web site. If you do not have the password, please call the hospital operator.  03/18/2024, 9:38 AM

## 2024-03-18 NOTE — Plan of Care (Signed)
  Problem: Education: Goal: Knowledge of General Education information will improve Description: Including pain rating scale, medication(s)/side effects and non-pharmacologic comfort measures Outcome: Progressing   Problem: Pain Managment: Goal: General experience of comfort will improve and/or be controlled Outcome: Progressing

## 2024-03-18 NOTE — Evaluation (Signed)
 Physical Therapy Evaluation Patient Details Name: Denise Hudson MRN: [id number] DOB: 04-07-1961 Today's Date: 03/18/2024  History of Present Illness  63 yo female presents to [facility] 11/19 with R abdominal pain, n/v. CT shows incisional hernia with inflamed mesenteric fat, fluid and short segment of thickened small bowel with narrowing as it enters hernia. S/p open incisional hernia repair, ex lap, small bowel resection 11/21. 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  Clinical Impression   Pt presents with severe abdominal pain with mobility, impaired balance, decreased knowledge and application of abdominal precautions, impaired activity tolerance. Pt to benefit from acute PT to address deficits. Pt requiring mod assist for bed mobility tasks and sitting EOB, pt crying in pain at EOB and needs to return to supine after 2 minutes EOB sitting. Pt encouraged to perform heel slides, ankle pumps in bed, and encouraged OOB daily with staff to progress and prevent secondary complications. PT to progress mobility as tolerated, and will continue to follow acutely.          If plan is discharge home, recommend the following: A little help with bathing/dressing/bathroom;A little help with walking and/or transfers   Can travel by private vehicle        Equipment Recommendations Rolling walker (2 wheels)  Recommendations for Other Services       Functional Status Assessment Patient has had a recent decline in their functional status and demonstrates the ability to make significant improvements in function in a reasonable and predictable amount of time.     Precautions / Restrictions Precautions Precautions: Fall;Other (comment) Precaution/Restrictions Comments: abdominal - log roll in and out of bed; NGT, periods of  tachycardia up to 150s that quickly resolves to 80s bpm Restrictions Weight Bearing Restrictions Per Provider Order: No      Mobility  Bed Mobility Overal bed mobility: Needs Assistance Bed Mobility: Rolling, Sidelying to Sit, Sit to Sidelying Rolling: Min assist Sidelying to sit: Mod assist     Sit to sidelying: Mod assist General bed mobility comments: assist for log roll to/from EOB, pt tolerating EOB x2 minutes only and tearful given severe pain    Transfers                   General transfer comment: unable to attempt    Ambulation/Gait                  Stairs            Wheelchair Mobility     Tilt Bed    Modified Rankin (Stroke Patients Only)       Balance Overall balance assessment: Needs assistance Sitting-balance support: Feet supported, Bilateral upper extremity supported Sitting balance-Leahy Scale: Fair                                       Pertinent Vitals/Pain Pain Assessment Pain Assessment: 0-10 Pain Score: 4  Pain Location: abdomen Pain Descriptors / Indicators: Burning, Sore Pain Intervention(s): Limited activity within patient's tolerance, Monitored during session, Repositioned    Home Living Family/patient expects to be discharged to:: Private residence Living Arrangements: Alone Available Help at Discharge: Family;Friend(s);Available PRN/intermittently Type of Home: Apartment Home Access: Level entry       Home Layout: One level Home Equipment: None  Prior Function Prior Level of Function : Independent/Modified Independent               ADLs Comments: works at [employer] Assessment   Upper Extremity Assessment Upper Extremity Assessment: Defer to OT evaluation    Lower Extremity Assessment Lower Extremity Assessment: Generalized weakness    Cervical / Trunk Assessment Cervical / Trunk Assessment: Other exceptions Cervical / Trunk Exceptions: abd  surgery  Communication   Communication Communication: No apparent difficulties    Cognition Arousal: Alert Behavior During Therapy: WFL for tasks assessed/performed   PT - Cognitive impairments: No apparent impairments                         Following commands: Intact       Cueing Cueing Techniques: Verbal cues, Gestural cues     General Comments      Exercises     Assessment/Plan    PT Assessment Patient needs continued PT services  PT Problem List Decreased strength;Decreased balance;Decreased range of motion;Decreased activity tolerance;Pain;Decreased knowledge of use of DME;Decreased mobility;Decreased knowledge of precautions       PT Treatment Interventions DME instruction;Therapeutic activities;Gait training;Therapeutic exercise;Patient/family education;Balance training;Functional mobility training;Neuromuscular re-education    PT Goals (Current goals can be found in the Care Plan section)  Acute Rehab PT Goals PT Goal Formulation: With patient Time For Goal Achievement: 04/01/24 Potential to Achieve Goals: Good    Frequency Min 2X/week     Co-evaluation               AM-PAC PT 6 Clicks Mobility  Outcome Measure Help needed turning from your back to your side while in a flat bed without using bedrails?: A Lot Help needed moving from lying on your back to sitting on the side of a flat bed without using bedrails?: A Lot Help needed moving to and from a bed to a chair (including a wheelchair)?: A Lot Help needed standing up from a chair using your arms (e.g., wheelchair or bedside chair)?: Total Help needed to walk in hospital room?: Total Help needed climbing 3-5 steps with a railing? : Total 6 Click Score: 9    End of Session   Activity Tolerance: Patient limited by pain Patient left: in bed;with call bell/phone within reach;with bed alarm set Nurse Communication: Mobility status PT Visit Diagnosis: Other abnormalities of gait and  mobility (R26.89);Muscle weakness (generalized) (M62.81);Pain    Time: 8397-8379 PT Time Calculation (min) (ACUTE ONLY): 18 min   Charges:   PT Evaluation $PT Eval Low Complexity: 1 Low   PT General Charges $$ ACUTE PT VISIT: 1 Visit         Johana RAMAN, PT DPT Acute Rehabilitation Services Secure Chat Preferred  Office [phone]   Rubee Vega [place] 03/18/2024, 4:58 PM

## 2024-03-18 NOTE — Plan of Care (Signed)

## 2024-03-18 NOTE — Op Note (Signed)
 03/17/2024  7:39 AM  PATIENT:  Denise Hudson  63 y.o. female  Patient Care Team: Pcp, No as PCP - General  PRE-OPERATIVE DIAGNOSIS:  incarcerated incisional hernia  POST-OPERATIVE DIAGNOSIS:  incarcerated incisional hernia  PROCEDURE:  Open primary incisional hernia repair, exploratory laparotomy, small bowel resection.  SURGEON:  Orie Silversmith, MD  ASSISTANT: None  ANESTHESIA:   general  COUNTS:  Sponge, needle and instrument counts were reported correct x2 at the conclusion of the operation.  EBL: 50cc  DRAINS: None  SPECIMEN: Small bowel  COMPLICATIONS: None.  FINDINGS: Incarcerated and strangulated small bowel in incisional hernia. Small 1.5x1cm defect. Bowel still with some signs of ischemia after reduced so resection and primarily anastomosed. Primary anastomosis of incisional hernia defect given necrotic mesentery and omentum within hernia sac and small size. NGT position confirmed intra-op   DISPOSITION: PACU in satisfactory condition  INDICATION: Denise Hudson is a 63yo female who presented with abdominal pain and nausea and emesis for 24h. CT imaging concerning for incarcerated intestine within incisional hernia in RLQ. Unable to reduce at bedside prompting emergent OR.  DESCRIPTION: The patient was identified in preop holding and taken to the OR where she was placed on the operating room table. SCDs were placed. General endotracheal anesthesia was induced without difficulty. The abdomen was then prepped and draped in the usual sterile fashion. A surgical timeout was performed indicating the correct patient, procedure, positioning and need for preoperative antibiotics.   An elliptical incision in the RLQ was made incorporating prior scar which was excised. Dissection through subcutaneous tissues, scarpa's fascia and oblique aponeuroses and oblique muscles separated bluntly. A hernia sac was noted containing was incarcerated mesentery, small bowel and omentum noted.  Unable to clearly identify hernia defect given large amount of hernia sac contents and defect had appeared quite small on imaging. Unable to reduce any contents. At this point decision was made to turn to laparotomy to help facilitate reduction of hernia.  A midline incision was made with a #10 blade and dissection carried out with electrocautery through subcutaneous tissues down to fascia. Fascia was incised with electrocautery and peritoneum grasped and sharply divided with Metzenbaums. The fascial and peritoneum incision was then extended to length of skin incision.   On entry into the abdomen attention was turned to right lower quadrant. Was able to identify loop of bowel herniating in RLQ and reduce this. It appeared thickened and slightly discolored concerning for ischemia with some hemorrhagic appearing mesentery. Elected to give bowel some time to see if pinked up prior to deciding whether resection warranted.  Attention turned back to hernia defect. Addressing remainder of hernia contents both through combination of gentle pressure from RLQ incision and gentle pulling from intraabdominal access, was able to reduce some ischemic omentum. This was divided with ligasure. There was some difficulty reducing remainder of ischemic omentum so this was divided with ligasure as it herniated into RLQ sac until all contents had either reduced or been excised.   Attention turned back to midline. The small bowel in its entirety was then eviscerated and run from the ligament of Treitz to the terminal ileum. The previously incarcerated small bowel still had thickening without peristalsis and appeared dusky. Decision made to resect. A mesenteric window was made at a point of healthy bowel proximal and distal to affected colon. Using a blue load of GIA 75 stapler x 2, small bowel was divided both proximally and distally. Mesentery was divided with ligasure.  The proximal and  distal staple lines of small intestine  were then approximated in a side to side fashion. Towels were laid down to minimize contamination. An enterotomy was made on both proximal and distal staple lines. The bowel was then manipulated into a side to side approximation at antimesenteric borders and a common enterotomy created with additional load of GIA 75 stapler.  Mucosa was hemostatic. Common enterotomy then closed with a fourth load of GIA 75 stapler. Anastomosis side to side functional end. A crotch stitch was placed with 2-0 vicryl. Corners of staple line were imbricated with 3-0 vicryl.   All instruments used during anastomosis creation were removed from field including towels used for contamination and new instruments, electrocautery and irrigation instruments used. Gloves changed.  The mesenteric defect was closed using interrupted figure of eight with 2-0 vicryl. Anastomosis appeared healthy and widely patent.   Attention turned back to hernia defect. Defect only measured 1.5cmx1cm. Given small size of defect in addition to necrotic omentum and small bowel resection, decision made for primary repair. Fascia was closed of defect using interrupted figure of eight with #1 novafil suture. RLQ site copiously irrigated with sterile saline and then closed in layers. Muscle re approximated with 2-0 vicryl. Aponeurosis closed with 3-0 running vicryl. Scarpa's closed with interrupted 3-0 vicryl. Skin loosely closed with skin staples.  Attention then turned back to midline. Closed hernia defect evaluated once more and no signs of persistent defect after fascial closure. The abdomen was then irrigated with sterile saline. Hemostasis was confirmed. The fascia was then closed with running #1 looped PDS. The skin was approximated with staples. A sterile dressing was applied to the incision. The patient was awakened from general anesthesia, transferred to a stretcher and transported to PACU in satisfactory condition.  CASE DATA: Type of patient?: DOW  CASE (Surgical Hospitalist [facility] Inpatient) Status of Case? EMERGENT Add On Infection Present At Time Of Surgery (PATOS)?  Necrotic omentum, ischemic small intestine    Orie Silversmith, MD General Surgery, Surgical Critical Care and Trauma

## 2024-03-19 DIAGNOSIS — I10 Essential (primary) hypertension: Secondary | ICD-10-CM | POA: Diagnosis not present

## 2024-03-19 DIAGNOSIS — K460 Unspecified abdominal hernia with obstruction, without gangrene: Secondary | ICD-10-CM | POA: Diagnosis not present

## 2024-03-19 DIAGNOSIS — I159 Secondary hypertension, unspecified: Secondary | ICD-10-CM | POA: Diagnosis not present

## 2024-03-19 DIAGNOSIS — I471 Supraventricular tachycardia, unspecified: Secondary | ICD-10-CM | POA: Diagnosis not present

## 2024-03-19 LAB — COMPREHENSIVE METABOLIC PANEL WITH GFR
ALT: 25 U/L (ref 0–44)
AST: 24 U/L (ref 15–41)
Albumin: 2.5 g/dL — ABNORMAL LOW (ref 3.5–5.0)
Alkaline Phosphatase: 77 U/L (ref 38–126)
Anion gap: 11 (ref 5–15)
BUN: 21 mg/dL (ref 8–23)
CO2: 24 mmol/L (ref 22–32)
Calcium: 8.5 mg/dL — ABNORMAL LOW (ref 8.9–10.3)
Chloride: 109 mmol/L (ref 98–111)
Creatinine, Ser: 0.93 mg/dL (ref 0.44–1.00)
GFR, Estimated: >60 mL/min (ref >60)
Glucose, Bld: 91 mg/dL (ref 70–99)
Potassium: 3.9 mmol/L (ref 3.5–5.1)
Sodium: 144 mmol/L (ref 135–145)
Total Bilirubin: 1.8 mg/dL — ABNORMAL HIGH (ref 0.0–1.2)
Total Protein: 5.8 g/dL — ABNORMAL LOW (ref 6.5–8.1)

## 2024-03-19 LAB — CBC
HCT: 37.9 % (ref 36.0–46.0)
Hemoglobin: 11.7 g/dL — ABNORMAL LOW (ref 12.0–15.0)
MCH: 24.6 pg — ABNORMAL LOW (ref 26.0–34.0)
MCHC: 30.9 g/dL (ref 30.0–36.0)
MCV: 79.8 fL — ABNORMAL LOW (ref 80.0–100.0)
Platelets: 231 K/uL (ref 150–400)
RBC: 4.75 MIL/uL (ref 3.87–5.11)
RDW: 14.1 % (ref 11.5–15.5)
WBC: 7.4 K/uL (ref 4.0–10.5)
nRBC: 0 % (ref 0.0–0.2)

## 2024-03-19 LAB — PHOSPHORUS: Phosphorus: 2.5 mg/dL (ref 2.5–4.6)

## 2024-03-19 LAB — MAGNESIUM: Magnesium: 2.1 mg/dL (ref 1.7–2.4)

## 2024-03-19 MED ORDER — METHOCARBAMOL 1000 MG/10ML IJ SOLN
1000.0000 mg | Freq: Three times a day (TID) | INTRAMUSCULAR | Status: DC
  Administered 2024-03-19 – 2024-03-22 (×9): 1000 mg via INTRAVENOUS
  Filled 2024-03-19 (×9): qty 10

## 2024-03-19 MED ORDER — ACETAMINOPHEN 650 MG RE SUPP: 650.0000 mg | Freq: Four times a day (QID) | RECTAL | Status: DC

## 2024-03-19 MED ORDER — OXYCODONE HCL 5 MG PO TABS
5.0000 mg | ORAL_TABLET | ORAL | Status: DC | PRN
  Administered 2024-03-19 – 2024-03-22 (×5): 5 mg via ORAL
  Filled 2024-03-19 (×5): qty 1

## 2024-03-19 MED ORDER — HYDRALAZINE HCL 20 MG/ML IJ SOLN
10.0000 mg | Freq: Four times a day (QID) | INTRAMUSCULAR | Status: DC | PRN
  Administered 2024-03-19: 10 mg via INTRAVENOUS
  Filled 2024-03-19: qty 1

## 2024-03-19 MED ORDER — ACETAMINOPHEN 500 MG PO TABS
1000.0000 mg | ORAL_TABLET | Freq: Four times a day (QID) | ORAL | Status: DC
  Administered 2024-03-19 – 2024-03-27 (×24): 1000 mg via ORAL
  Filled 2024-03-19 (×27): qty 2

## 2024-03-19 NOTE — Progress Notes (Signed)
 Ms. Glasby alert and oriented x4. No complaints of pain currently, explained about process of removing NGT. Removed NGT with no issues, no coughing or emesis noted post removal. Planck sitting up in bed with call light and personal belongings within reach, bed in lowest position, and bed alarm on.

## 2024-03-19 NOTE — Progress Notes (Signed)
 Progress Note  2 Days Post-Op  Subjective: Patient reports abdominal soreness that is manageable. Has not had BM or flatulence since surgery. Denies nausea and vomiting. NG low output.   Per RN had a lot of pain EOB with PT yesterday. Needing IV fetanyl regularly.  ROS  All negative with the exception of above.  Objective: Vital signs in last 24 hours: Temp:  [97.3 F (36.3 C)-99.8 F (37.7 C)] 99.1 F (37.3 C) (11/22 0728) Pulse Rate:  [54-99] 90 (11/22 0728) Resp:  [8-40] 18 (11/22 0728) BP: (151-224)/(60-99) 165/76 (11/22 0728) SpO2:  [87 %-95 %] 93 % (11/22 0728)    Intake/Output from previous day: 11/21 0701 - 11/22 0700 In: 480 [P.O.:480] Out: 1125 [Urine:1025; Emesis/NG output:100] Intake/Output this shift: No intake/output data recorded.  PE: General: Pleasant female who is laying in bed in NAD. HEENT: Head is normocephalic, atraumatic. NGT in place with scant output (100 mL/24h) Heart: HR normal during encounter.  Lungs: Respiratory effort nonlabored. Abd: Soft. ND. Appropriately tender, Abdominal binder present. Midline incision present with staples that is C/D/I. Honeycomb dressing covering. Right lower abdominal incision with staples that is C/D/I covered with additional honeycomb dressing. +BS. No rebound tenderness or guarding.  MS: Able to move extremities.  Skin: Warm and dry. Psych: A&Ox3 with an appropriate affect.    Lab Results:  Recent Labs    03/17/24 0050 03/19/24 0421  WBC 5.9 7.4  HGB 13.7 11.7*  HCT 43.9 37.9  PLT 231 231   BMET Recent Labs    03/17/24 0050 03/19/24 0421  NA 137 144  K 3.4* 3.9  CL 101 109  CO2 23 24  GLUCOSE 113* 91  BUN 45* 21  CREATININE 1.18* 0.93  CALCIUM 8.4* 8.5*   PT/INR No results for input(s): LABPROT, INR in the last 72 hours. CMP     Component Value Date/Time   NA 144 03/19/2024 0421   K 3.9 03/19/2024 0421   CL 109 03/19/2024 0421   CO2 24 03/19/2024 0421   GLUCOSE 91 03/19/2024  0421   BUN 21 03/19/2024 0421   CREATININE 0.93 03/19/2024 0421   CREATININE 0.91 11/06/2022 0148   CALCIUM 8.5 (L) 03/19/2024 0421   PROT 5.8 (L) 03/19/2024 0421   ALBUMIN  2.5 (L) 03/19/2024 0421   AST 24 03/19/2024 0421   ALT 25 03/19/2024 0421   ALKPHOS 77 03/19/2024 0421   BILITOT 1.8 (H) 03/19/2024 0421   GFRNONAA >60 03/19/2024 0421   GFRAA 58 (L) 10/18/2019 1255   Lipase     Component Value Date/Time   LIPASE 34 03/16/2024 2215       Studies/Results: ECHOCARDIOGRAM COMPLETE Result Date: 03/18/2024    ECHOCARDIOGRAM REPORT   Patient Name:   Denise Hudson Date of Exam: 03/17/2024 Medical Rec #:  [id number]     Height:       73.0 in Accession #:    [id number]    Weight:       147.7 lb Date of Birth:  February 18, 1961     BSA:          1.892 m Patient Age:    63 years      BP:           171/69 mmHg Patient Gender: F             HR:           58 bpm. Exam Location:  Inpatient Procedure: 2D Echo, Cardiac Doppler and Color Doppler (Both  Spectral and Color            Flow Doppler were utilized during procedure). Indications:    SVT (supraventricular tachycardia)  History:        Patient has no prior history of Echocardiogram examinations.                 Risk Factors:Hypertension.  Sonographer:    Philomena Daring Referring Phys: [id number] SUBRINA SUNDIL IMPRESSIONS  1. Left ventricular ejection fraction, by estimation, is 60 to 65%. The left ventricle has normal function. The left ventricle has no regional wall motion abnormalities. There is mild concentric left ventricular hypertrophy. Left ventricular diastolic parameters are consistent with Grade I diastolic dysfunction (impaired relaxation).  2. Right ventricular systolic function is normal. The right ventricular size is normal.  3. The mitral valve is normal in structure. Trivial mitral valve regurgitation. Moderate mitral stenosis.  4. The aortic valve is tricuspid. There is mild calcification of the aortic valve. Aortic valve regurgitation is not  visualized. Aortic valve sclerosis/calcification is present, without any evidence of aortic stenosis. FINDINGS  Left Ventricle: Left ventricular ejection fraction, by estimation, is 60 to 65%. The left ventricle has normal function. The left ventricle has no regional wall motion abnormalities. The left ventricular internal cavity size was normal in size. There is  mild concentric left ventricular hypertrophy. Left ventricular diastolic parameters are consistent with Grade I diastolic dysfunction (impaired relaxation). Right Ventricle: The right ventricular size is normal. No increase in right ventricular wall thickness. Right ventricular systolic function is normal. Left Atrium: Left atrial size was normal in size. Right Atrium: Right atrial size was normal in size. Pericardium: There is no evidence of pericardial effusion. Mitral Valve: The mitral valve is normal in structure. Trivial mitral valve regurgitation. Moderate mitral valve stenosis. Tricuspid Valve: The tricuspid valve is normal in structure. Tricuspid valve regurgitation is trivial. No evidence of tricuspid stenosis. Aortic Valve: The aortic valve is tricuspid. There is mild calcification of the aortic valve. Aortic valve regurgitation is not visualized. Aortic valve sclerosis/calcification is present, without any evidence of aortic stenosis. Aortic valve mean gradient measures 5.0 mmHg. Aortic valve peak gradient measures 9.2 mmHg. Aortic valve area, by VTI measures 2.23 cm. Pulmonic Valve: The pulmonic valve was normal in structure. Pulmonic valve regurgitation is trivial. No evidence of pulmonic stenosis. Aorta: The aortic root is normal in size and structure. Venous: The inferior vena cava was not well visualized. IAS/Shunts: No atrial level shunt detected by color flow Doppler.  LEFT VENTRICLE PLAX 2D LVIDd:         4.70 cm   Diastology LVIDs:         3.30 cm   LV e' medial:    7.18 cm/s LV PW:         1.20 cm   LV E/e' medial:  9.5 LV IVS:         1.20 cm   LV e' lateral:   6.20 cm/s LVOT diam:     2.10 cm   LV E/e' lateral: 11.0 LV SV:         71 LV SV Index:   38 LVOT Area:     3.46 cm  RIGHT VENTRICLE RV Basal diam:  3.10 cm RV Mid diam:    2.30 cm RV S prime:     11.20 cm/s TAPSE (M-mode): 3.2 cm LEFT ATRIUM             Index  RIGHT ATRIUM           Index LA diam:        2.90 cm 1.53 cm/m   RA Area:     18.20 cm LA Vol (A2C):   46.8 ml 24.74 ml/m  RA Volume:   43.50 ml  23.00 ml/m LA Vol (A4C):   51.0 ml 26.96 ml/m LA Biplane Vol: 51.7 ml 27.33 ml/m  AORTIC VALVE AV Area (Vmax):    2.18 cm AV Area (Vmean):   2.20 cm AV Area (VTI):     2.23 cm AV Vmax:           152.00 cm/s AV Vmean:          100.000 cm/s AV VTI:            0.320 m AV Peak Grad:      9.2 mmHg AV Mean Grad:      5.0 mmHg LVOT Vmax:         95.80 cm/s LVOT Vmean:        63.600 cm/s LVOT VTI:          0.206 m LVOT/AV VTI ratio: 0.64  AORTA Ao Root diam: 2.60 cm Ao Asc diam:  2.90 cm MITRAL VALVE                TRICUSPID VALVE MV Area (PHT): 2.65 cm     TR Peak grad:   6.2 mmHg MV Decel Time: 286 msec     TR Vmax:        124.00 cm/s MV E velocity: 68.30 cm/s MV A velocity: 118.00 cm/s  SHUNTS MV E/A ratio:  0.58         Systemic VTI:  0.21 m                             Systemic Diam: 2.10 cm Toribio Fuel MD Electronically signed by Toribio Fuel MD Signature Date/Time: 03/18/2024/12:10:56 AM    Final    DG CHEST PORT 1 VIEW Result Date: 03/17/2024 CLINICAL DATA:  Nasogastric tube evaluation. EXAM: PORTABLE CHEST 1 VIEW COMPARISON:  03/17/2024 at 12:25 a.m. FINDINGS: Interval placement of nasogastric tube which courses into the region of the stomach and off the inferior aspect of the images tip is not visualized. Lungs are adequately inflated without effusion. Subtle bibasilar density likely atelectasis, although early infection is possible. Cardiomediastinal silhouette and remainder of the exam is unchanged. IMPRESSION: 1. Interval placement of nasogastric tube  which courses into the region of the stomach and off the image as tip is not visualized. 2. Subtle bibasilar density likely atelectasis, although early infection is possible. Electronically Signed   By: Toribio Agreste M.D.   On: 03/17/2024 15:42    Anti-infectives: Anti-infectives (From admission, onward)    Start     Dose/Rate Route Frequency Ordered Stop   03/17/24 0600  piperacillin -tazobactam (ZOSYN ) IVPB 3.375 g        3.375 g 12.5 mL/hr over 240 Minutes Intravenous Every 8 hours 03/17/24 0018     03/17/24 0030  piperacillin -tazobactam (ZOSYN ) IVPB 3.375 g        3.375 g 100 mL/hr over 30 Minutes Intravenous  Once 03/17/24 0018 03/17/24 0134        Assessment/Plan POD2: LAPAROTOMY, EXPLORATORY, SMALL BOWEL RESECTION, WOUND EXPLORATION (N/A) - Repair of incarcerated Incisional hernia by Dr. Richerd Silversmith on 11/20 -Hypertensive. Afebrile, WBC 7. Noted hgb 11.7 from 13.7, trend.  -  NG low output x 48h, D/C NGT and await bowel function. Ice chips/sips with meds ok.  -Multi-modal pain control: schedule tylenol , increase robaxin  to 1,000 mg, add oxy 5 mg q 4h PRN.  -ok to D/C foley from CCS standpoint. -Will continue to follow.  FEN: NPO/NGT; IVF per primary team; AKI resolved VTE: None currently Foley: in place, UOP 1,025 mL  ID: [email or web address]     LOS: 2 days   I reviewed nursing notes, specialist notes, hospitalist notes, last 24 h vitals and pain scores, last 48 h intake and output, last 24 h labs and trends, and last 24 h imaging results.   Denise Hudson, [facility],The Surgery 03/19/2024, 11:29 AM Please see Amion for pager number during day hours 7:00am-4:30pm

## 2024-03-19 NOTE — Progress Notes (Signed)
 PROGRESS NOTE        PATIENT DETAILS Name: Denise Hudson Age: 63 y.o. Sex: female Date of Birth: 01-02-1961 Admit Date: 03/16/2024 Admitting Physician Micaela Speaker, MD PCP:Pcp, No  Brief Summary: Patient is a 63 y.o.  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  Significant events: 11/20>> admit to [facility]  Significant studies: 11/19>> CT abdomen/pelvis: SBO-transition zone seen as a loop of small bowel enters lateral  ventral hernia. 11/20>> echo: EF 60-65%  Significant microbiology data: 11/20>> blood culture: No growth  Procedures: 11/20>> expiratory laparotomy with small bowel resection.  Consults: General Surgery  Subjective: Continues to have pain at the operative site-has not passed flatus or had a BM yet.  Objective: Vitals: Blood pressure (!) 165/76, pulse 90, temperature 99.1 F (37.3 C), temperature source Oral, resp. rate 18, height 6' 1 (1.854 m), weight 67 kg, last menstrual period 09/23/2015, SpO2 93%.   Exam: Gen Exam:Alert awake-not in any distress HEENT:atraumatic, normocephalic Chest: B/L clear to auscultation anteriorly CVS:S1S2 regular Abdomen: Soft-some amount of tenderness still persists mostly around the operative area. Extremities:no edema Neurology: Non focal Skin: no rash  Pertinent Labs/Radiology:    Latest Ref Rng & Units 03/19/2024    4:21 AM 03/17/2024   12:50 AM 03/16/2024   10:22 PM  CBC  WBC 4.0 - 10.5 K/uL 7.4  5.9    Hemoglobin 12.0 - 15.0 g/dL 88.2  86.2  81.9   Hematocrit 36.0 - 46.0 % 37.9  43.9  53.0   Platelets 150 - 400 K/uL 231  231      Lab Results  Component Value Date   NA 144 03/19/2024   K 3.9 03/19/2024   CL 109  03/19/2024   CO2 24 03/19/2024      Assessment/Plan: SBO secondary to incarcerated incisional hernia S/p exploratory laparotomy/bowel resection by general surgery on 11/20 NG tube remains in place Continue supportive care with IVF/as needed narcotics General Surgery following-await further recommendations-defer further postop care to general surgery service.  Paroxysmal supraventricular tachycardia Seen on initial presentation in the ED Given 2 doses of adenosine  with restoration of sinus rhythm Echo/TSH stable Continue IV metoprolol .  HTN BP elevated-likely secondary to IV metoprolol -although elevated-BP trend is much better Continue IV metoprolol  Continue as needed IV hydralazine .  AKI Mild Likely hemodynamically mediated Has resolved with supportive care.  Hypokalemia Repleted.  Code status:   Code Status: Full Code   DVT Prophylaxis: enoxaparin  (LOVENOX ) injection 40 mg Start: 03/18/24 1630 SCDs Start: 03/17/24 0023 Place TED hose Start: 03/17/24 0023   Family Communication: None at bedside   Disposition Plan: Status is: Inpatient Remains inpatient appropriate because: Severity of illness   Planned Discharge Destination:Home   Diet: Diet Order             Diet NPO time specified  Diet effective now                     Antimicrobial agents: Anti-infectives (From admission, onward)    Start     Dose/Rate Route Frequency Ordered Stop   03/17/24 0600  piperacillin -tazobactam (ZOSYN ) IVPB 3.375 g        3.375  g 12.5 mL/hr over 240 Minutes Intravenous Every 8 hours 03/17/24 0018     03/17/24 0030  piperacillin -tazobactam (ZOSYN ) IVPB 3.375 g        3.375 g 100 mL/hr over 30 Minutes Intravenous  Once 03/17/24 0018 03/17/24 0134        MEDICATIONS: Scheduled Meds:  Chlorhexidine  Gluconate Cloth  6 each Topical Daily   enoxaparin  (LOVENOX ) injection  40 mg Subcutaneous Q24H   methocarbamol  (ROBAXIN ) injection  500 mg Intravenous Q8H    metoprolol  tartrate  5 mg Intravenous Q6H   sodium chloride  flush  3 mL Intravenous Q12H   Continuous Infusions:  lactated ringers  75 mL/hr at 03/19/24 0818   piperacillin -tazobactam (ZOSYN )  IV 3.375 g (03/19/24 0513)   PRN Meds:.acetaminophen  **OR** acetaminophen , fentaNYL  (SUBLIMAZE ) injection, hydrALAZINE , morphine  injection, ondansetron  **OR** ondansetron  (ZOFRAN ) IV, phenol, sodium chloride  flush   I have personally reviewed following labs and imaging studies  LABORATORY DATA: CBC: Recent Labs  Lab 03/16/24 2215 03/16/24 2222 03/17/24 0050 03/19/24 0421  WBC 7.3  --  5.9 7.4  HGB 15.2* 18.0* 13.7 11.7*  HCT 48.2* 53.0* 43.9 37.9  MCV 79.0*  --  80.0 79.8*  PLT 245  --  231 231    Basic Metabolic Panel: Recent Labs  Lab 03/16/24 2215 03/16/24 2222 03/17/24 0050 03/19/24 0421  NA 137 138 137 144  K 3.5 3.5 3.4* 3.9  CL 99 103 101 109  CO2 18*  --  23 24  GLUCOSE 130* 134* 113* 91  BUN 50* 52* 45* 21  CREATININE 1.31* 1.50* 1.18* 0.93  CALCIUM 9.6  --  8.4* 8.5*  MG  --   --   --  2.1  PHOS  --   --   --  2.5    GFR: Estimated Creatinine Clearance: 67.2 mL/min (by C-G formula based on SCr of 0.93 mg/dL).  Liver Function Tests: Recent Labs  Lab 03/16/24 2215 03/17/24 0050 03/19/24 0421  AST 60* 50* 24  ALT 52* 44 25  ALKPHOS 119 103 77  BILITOT 2.1* 1.7* 1.8*  PROT 7.9 7.0 5.8*  ALBUMIN  3.8 3.2* 2.5*   Recent Labs  Lab 03/16/24 2215  LIPASE 34   No results for input(s): AMMONIA in the last 168 hours.  Coagulation Profile: No results for input(s): INR, PROTIME in the last 168 hours.  Cardiac Enzymes: No results for input(s): CKTOTAL, CKMB, CKMBINDEX, TROPONINI in the last 168 hours.  BNP (last 3 results) No results for input(s): PROBNP in the last 8760 hours.  Lipid Profile: No results for input(s): CHOL, HDL, LDLCALC, TRIG, CHOLHDL, LDLDIRECT in the last 72 hours.  Thyroid Function Tests: Recent Labs     03/16/24 2310  TSH 3.054    Anemia Panel: No results for input(s): VITAMINB12, FOLATE, FERRITIN, TIBC, IRON, RETICCTPCT in the last 72 hours.  Urine analysis:    Component Value Date/Time   COLORURINE YELLOW 03/16/2024 2359   APPEARANCEUR HAZY (A) 03/16/2024 2359   LABSPEC 1.040 (H) 03/16/2024 2359   PHURINE 6.0 03/16/2024 2359   GLUCOSEU NEGATIVE 03/16/2024 2359   HGBUR SMALL (A) 03/16/2024 2359   BILIRUBINUR NEGATIVE 03/16/2024 2359   KETONESUR NEGATIVE 03/16/2024 2359   PROTEINUR 30 (A) 03/16/2024 2359   UROBILINOGEN 1.0 01/02/2015 0043   NITRITE NEGATIVE 03/16/2024 2359   LEUKOCYTESUR LARGE (A) 03/16/2024 2359    Sepsis Labs: Lactic Acid, Venous    Component Value Date/Time   LATICACIDVEN 2.1 (HH) 03/17/2024 0026    MICROBIOLOGY: Recent Results (  from the past 240 hours)  Culture, blood (Routine X 2) w Reflex to ID Panel     Status: None (Preliminary result)   Collection Time: 03/17/24 12:40 AM   Specimen: BLOOD RIGHT ARM  Result Value Ref Range Status   Specimen Description BLOOD RIGHT ARM  Final   Special Requests   Final    BOTTLES DRAWN AEROBIC AND ANAEROBIC Blood Culture adequate volume   Culture   Final    NO GROWTH 2 DAYS Performed at [facility], [address] [place], [place] [address]    Report Status PENDING  Incomplete  Culture, blood (Routine X 2) w Reflex to ID Panel     Status: None (Preliminary result)   Collection Time: 03/17/24 12:50 AM   Specimen: BLOOD  Result Value Ref Range Status   Specimen Description BLOOD LEFT ANTECUBITAL  Final   Special Requests   Final    BOTTLES DRAWN AEROBIC AND ANAEROBIC Blood Culture adequate volume   Culture   Final    NO GROWTH 2 DAYS Performed at [facility], [address] [place], [place] [address]    Report Status PENDING  Incomplete    RADIOLOGY STUDIES/RESULTS: ECHOCARDIOGRAM COMPLETE Result Date: 03/18/2024    ECHOCARDIOGRAM REPORT   Patient Name:   DARIELLE HANCHER  Date of Exam: 03/17/2024 Medical Rec #:  [id number]     Height:       73.0 in Accession #:    [id number]    Weight:       147.7 lb Date of Birth:  [birth year]     BSA:          1.892 m Patient Age:    63 years      BP:           171/69 mmHg Patient Gender: F             HR:           58 bpm. Exam Location:  Inpatient Procedure: 2D Echo, Cardiac Doppler and Color Doppler (Both Spectral and Color            Flow Doppler were utilized during procedure). Indications:    SVT (supraventricular tachycardia)  History:        Patient has no prior history of Echocardiogram examinations.                 Risk Factors:Hypertension.  Sonographer:    Philomena Daring Referring Phys: [id number] SUBRINA SUNDIL IMPRESSIONS  1. Left ventricular ejection fraction, by estimation, is 60 to 65%. The left ventricle has normal function. The left ventricle has no regional wall motion abnormalities. There is mild concentric left ventricular hypertrophy. Left ventricular diastolic parameters are consistent with Grade I diastolic dysfunction (impaired relaxation).  2. Right ventricular systolic function is normal. The right ventricular size is normal.  3. The mitral valve is normal in structure. Trivial mitral valve regurgitation. Moderate mitral stenosis.  4. The aortic valve is tricuspid. There is mild calcification of the aortic valve. Aortic valve regurgitation is not visualized. Aortic valve sclerosis/calcification is present, without any evidence of aortic stenosis. FINDINGS  Left Ventricle: Left ventricular ejection fraction, by estimation, is 60 to 65%. The left ventricle has normal function. The left ventricle has no regional wall motion abnormalities. The left ventricular internal cavity size was normal in size. There is  mild concentric left ventricular hypertrophy. Left ventricular diastolic parameters are consistent with Grade I diastolic dysfunction (impaired relaxation). Right  Ventricle: The right ventricular size is normal. No increase  in right ventricular wall thickness. Right ventricular systolic function is normal. Left Atrium: Left atrial size was normal in size. Right Atrium: Right atrial size was normal in size. Pericardium: There is no evidence of pericardial effusion. Mitral Valve: The mitral valve is normal in structure. Trivial mitral valve regurgitation. Moderate mitral valve stenosis. Tricuspid Valve: The tricuspid valve is normal in structure. Tricuspid valve regurgitation is trivial. No evidence of tricuspid stenosis. Aortic Valve: The aortic valve is tricuspid. There is mild calcification of the aortic valve. Aortic valve regurgitation is not visualized. Aortic valve sclerosis/calcification is present, without any evidence of aortic stenosis. Aortic valve mean gradient measures 5.0 mmHg. Aortic valve peak gradient measures 9.2 mmHg. Aortic valve area, by VTI measures 2.23 cm. Pulmonic Valve: The pulmonic valve was normal in structure. Pulmonic valve regurgitation is trivial. No evidence of pulmonic stenosis. Aorta: The aortic root is normal in size and structure. Venous: The inferior vena cava was not well visualized. IAS/Shunts: No atrial level shunt detected by color flow Doppler.  LEFT VENTRICLE PLAX 2D LVIDd:         4.70 cm   Diastology LVIDs:         3.30 cm   LV e' medial:    7.18 cm/s LV PW:         1.20 cm   LV E/e' medial:  9.5 LV IVS:        1.20 cm   LV e' lateral:   6.20 cm/s LVOT diam:     2.10 cm   LV E/e' lateral: 11.0 LV SV:         71 LV SV Index:   38 LVOT Area:     3.46 cm  RIGHT VENTRICLE RV Basal diam:  3.10 cm RV Mid diam:    2.30 cm RV S prime:     11.20 cm/s TAPSE (M-mode): 3.2 cm LEFT ATRIUM             Index        RIGHT ATRIUM           Index LA diam:        2.90 cm 1.53 cm/m   RA Area:     18.20 cm LA Vol (A2C):   46.8 ml 24.74 ml/m  RA Volume:   43.50 ml  23.00 ml/m LA Vol (A4C):   51.0 ml 26.96 ml/m LA Biplane Vol: 51.7 ml 27.33 ml/m  AORTIC VALVE AV Area (Vmax):    2.18 cm AV Area (Vmean):    2.20 cm AV Area (VTI):     2.23 cm AV Vmax:           152.00 cm/s AV Vmean:          100.000 cm/s AV VTI:            0.320 m AV Peak Grad:      9.2 mmHg AV Mean Grad:      5.0 mmHg LVOT Vmax:         95.80 cm/s LVOT Vmean:        63.600 cm/s LVOT VTI:          0.206 m LVOT/AV VTI ratio: 0.64  AORTA Ao Root diam: 2.60 cm Ao Asc diam:  2.90 cm MITRAL VALVE                TRICUSPID VALVE MV Area (PHT): 2.65 cm     TR Peak grad:  6.2 mmHg MV Decel Time: 286 msec     TR Vmax:        124.00 cm/s MV E velocity: 68.30 cm/s MV A velocity: 118.00 cm/s  SHUNTS MV E/A ratio:  0.58         Systemic VTI:  0.21 m                             Systemic Diam: 2.10 cm Toribio Fuel MD Electronically signed by Toribio Fuel MD Signature Date/Time: 03/18/2024/12:10:56 AM    Final    DG CHEST PORT 1 VIEW Result Date: 03/17/2024 CLINICAL DATA:  Nasogastric tube evaluation. EXAM: PORTABLE CHEST 1 VIEW COMPARISON:  03/17/2024 at 12:25 a.m. FINDINGS: Interval placement of nasogastric tube which courses into the region of the stomach and off the inferior aspect of the images tip is not visualized. Lungs are adequately inflated without effusion. Subtle bibasilar density likely atelectasis, although early infection is possible. Cardiomediastinal silhouette and remainder of the exam is unchanged. IMPRESSION: 1. Interval placement of nasogastric tube which courses into the region of the stomach and off the image as tip is not visualized. 2. Subtle bibasilar density likely atelectasis, although early infection is possible. Electronically Signed   By: Toribio Agreste M.D.   On: 03/17/2024 15:42     LOS: 2 days   Donalda Applebaum, MD  [facility]    To contact the attending provider between 7A-7P or the covering provider during after hours 7P-7A, please log into the web site [email or web address] and access using universal [place] password for that web site. If you do not have the password, please call the hospital  operator.  03/19/2024, 11:02 AM

## 2024-03-19 NOTE — Evaluation (Signed)
 Occupational Therapy Evaluation Patient Details Name: Denise Hudson MRN: [id number] DOB: 1960/05/10 Today's Date: 03/19/2024   History of Present Illness   63 yo female [facility] 11/19 with R abdominal pain, n/v. CT shows incisional hernia with inflamed mesenteric fat, fluid and short segment of thickened small bowel with narrowing as it enters hernia. S/p open incisional hernia repair, ex lap, small bowel resection 11/21. 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     Clinical Impressions PTA Pt was independent with all ADL/IADLs and functional mobility. Pt currently requires up to Max A overall  for bed mobility and ADL engagement. Pt is primarily limited by abdominal pain, decreased activity tolerance, generalized weakness, and decreased sitting balance. OT to follow Pt acutely to facilitate progress towards goals. Recommend HHOT with d/c to maximize occupational independence and reduce burden of care.      If plan is discharge home, recommend the following:   A lot of help with walking and/or transfers;A lot of help with bathing/dressing/bathroom;Assistance with cooking/housework;Assist for transportation;Help with stairs or ramp for entrance     Functional Status Assessment   Patient has had a recent decline in their functional status and demonstrates the ability to make significant improvements in function in a reasonable and predictable amount of time.     Equipment Recommendations   BSC/3in1     Recommendations for Other Services         Precautions/Restrictions   Precautions Precautions: Fall;Other (comment) Recall of Precautions/Restrictions: Intact Precaution/Restrictions Comments: abdominal - log roll in and out of bed; periods of tachycardia up to 170's that quickly resolves  to 80s bpm Restrictions Weight Bearing Restrictions Per Provider Order: No     Mobility Bed Mobility Overal bed mobility: Needs Assistance Bed Mobility: Rolling, Sidelying to Sit Rolling: Min assist Sidelying to sit: Max assist       General bed mobility comments: Mod A to raise trunk from bed. Pt with increased abdominal pain limiting sitting endurance. Pt unable to scoot towards EOB. Strong L lateral lean and propping on elbow. Pt required Max A to return to supine.    Transfers                   General transfer comment: Unable to attempt transfer OOB d/t abdominal pain      Balance Overall balance assessment: Needs assistance Sitting-balance support: Bilateral upper extremity supported, Feet supported Sitting balance-Leahy Scale: Poor Sitting balance - Comments: Pt required support to maintain sitting balance. Postural control: Left lateral lean                                 ADL either performed or assessed with clinical judgement   ADL Overall ADL's : Needs assistance/impaired Eating/Feeding: Set up;Sitting   Grooming: Minimal assistance;Sitting   Upper Body Bathing: Minimal assistance;Sitting   Lower Body Bathing: Maximal assistance   Upper Body Dressing : Minimal assistance   Lower Body Dressing: Maximal assistance       Toileting- Clothing Manipulation and Hygiene: Total assistance Toileting - Clothing Manipulation Details (indicate cue type and reason): catheter             Vision   Vision Assessment?: No apparent visual deficits     Perception         Praxis  Pertinent Vitals/Pain Pain Assessment Pain Assessment: 0-10 Pain Score: 8  Pain Location: abdomen Pain Descriptors / Indicators: Sore, Discomfort Pain Intervention(s): Limited activity within patient's tolerance, Monitored during session, Repositioned     Extremity/Trunk Assessment Upper Extremity Assessment Upper Extremity Assessment:  Generalized weakness   Lower Extremity Assessment Lower Extremity Assessment: Defer to PT evaluation   Cervical / Trunk Assessment Cervical / Trunk Assessment: Other exceptions Cervical / Trunk Exceptions: abd surgery   Communication Communication Communication: No apparent difficulties   Cognition Arousal: Alert Behavior During Therapy: Anxious Cognition: No apparent impairments                               Following commands: Impaired Following commands impaired: Follows one step commands with increased time     Cueing  General Comments   Cueing Techniques: Verbal cues;Gestural cues  Tachycardic throughout session as noted on vital monitors. HR range from 170's-80's.   Exercises     Shoulder Instructions      Home Living Family/patient expects to be discharged to:: Private residence Living Arrangements: Alone Available Help at Discharge: Family;Friend(s);Available PRN/intermittently Type of Home: Apartment Home Access: Level entry     Home Layout: One level     Bathroom Shower/Tub: Chief Strategy Officer: Standard Bathroom Accessibility: Yes How Accessible: Accessible via walker Home Equipment: Rollator (4 wheels);Grab bars - tub/shower;Hand held shower head          Prior Functioning/Environment Prior Level of Function : Independent/Modified Independent             Mobility Comments: Indpendent ADLs Comments: Independent    OT Problem List: Decreased strength;Decreased activity tolerance;Impaired balance (sitting and/or standing);Decreased knowledge of use of DME or AE;Decreased knowledge of precautions;Pain   OT Treatment/Interventions: Self-care/ADL training;Therapeutic exercise;Energy conservation;DME and/or AE instruction;Therapeutic activities;Patient/family education;Balance training      OT Goals(Current goals can be found in the care plan section)   Acute Rehab OT Goals Patient Stated Goal: to decrease  pain OT Goal Formulation: With patient Time For Goal Achievement: 04/02/24 Potential to Achieve Goals: Good ADL Goals Pt Will Perform Grooming: with set-up;sitting Pt Will Perform Upper Body Dressing: with supervision;sitting Pt Will Perform Lower Body Dressing: with min assist;with adaptive equipment Additional ADL Goal #1: Pt will tolerate EOB sitting for at least 5 minutes with supervision to facilitate activity tolerance for ADL engagement.   OT Frequency:  Min 2X/week    Co-evaluation              AM-PAC OT 6 Clicks Daily Activity     Outcome Measure Help from another person eating meals?: A Little Help from another person taking care of personal grooming?: A Little Help from another person toileting, which includes using toliet, bedpan, or urinal?: Total Help from another person bathing (including washing, rinsing, drying)?: A Lot Help from another person to put on and taking off regular upper body clothing?: A Little Help from another person to put on and taking off regular lower body clothing?: A Lot 6 Click Score: 14   End of Session Nurse Communication: Mobility status  Activity Tolerance: Patient limited by pain Patient left: in bed;with call bell/phone within reach;with nursing/sitter in room  OT Visit Diagnosis: Muscle weakness (generalized) (M62.81);Other abnormalities of gait and mobility (R26.89);Pain Pain - part of body:  (abdomen)                Time: 8440-8383 OT Time Calculation (  min): 17 min Charges:  OT General Charges $OT Visit: 1 Visit OT Evaluation $OT Eval Moderate Complexity: 1 Mod  Maurilio CROME, OTR/L.  [facility]  Office: [phone]   Maurilio PARAS Bailey Kolbe 03/19/2024, 5:24 PM

## 2024-03-20 DIAGNOSIS — I10 Essential (primary) hypertension: Secondary | ICD-10-CM | POA: Diagnosis not present

## 2024-03-20 DIAGNOSIS — K460 Unspecified abdominal hernia with obstruction, without gangrene: Secondary | ICD-10-CM | POA: Diagnosis not present

## 2024-03-20 DIAGNOSIS — I471 Supraventricular tachycardia, unspecified: Secondary | ICD-10-CM | POA: Diagnosis not present

## 2024-03-20 DIAGNOSIS — I159 Secondary hypertension, unspecified: Secondary | ICD-10-CM | POA: Diagnosis not present

## 2024-03-20 LAB — BASIC METABOLIC PANEL WITH GFR
Anion gap: 12 (ref 5–15)
BUN: 22 mg/dL (ref 8–23)
CO2: 22 mmol/L (ref 22–32)
Calcium: 8.7 mg/dL — ABNORMAL LOW (ref 8.9–10.3)
Chloride: 110 mmol/L (ref 98–111)
Creatinine, Ser: 0.81 mg/dL (ref 0.44–1.00)
GFR, Estimated: >60 mL/min (ref >60)
Glucose, Bld: 100 mg/dL — ABNORMAL HIGH (ref 70–99)
Potassium: 3.3 mmol/L — ABNORMAL LOW (ref 3.5–5.1)
Sodium: 144 mmol/L (ref 135–145)

## 2024-03-20 LAB — CBC
HCT: 39.5 % (ref 36.0–46.0)
Hemoglobin: 12.5 g/dL (ref 12.0–15.0)
MCH: 24.8 pg — ABNORMAL LOW (ref 26.0–34.0)
MCHC: 31.6 g/dL (ref 30.0–36.0)
MCV: 78.2 fL — ABNORMAL LOW (ref 80.0–100.0)
Platelets: 255 K/uL (ref 150–400)
RBC: 5.05 MIL/uL (ref 3.87–5.11)
RDW: 14 % (ref 11.5–15.5)
WBC: 7 K/uL (ref 4.0–10.5)
nRBC: 0 % (ref 0.0–0.2)

## 2024-03-20 MED ORDER — HYDRALAZINE HCL 20 MG/ML IJ SOLN
10.0000 mg | Freq: Four times a day (QID) | INTRAMUSCULAR | Status: DC | PRN
  Administered 2024-03-22 – 2024-03-25 (×3): 10 mg via INTRAVENOUS
  Filled 2024-03-20 (×3): qty 1

## 2024-03-20 MED ORDER — CLONIDINE HCL 0.1 MG PO TABS
0.1000 mg | ORAL_TABLET | Freq: Three times a day (TID) | ORAL | Status: DC | PRN
  Administered 2024-03-20: 0.1 mg via ORAL
  Filled 2024-03-20: qty 1

## 2024-03-20 MED ORDER — HYDROMORPHONE HCL 1 MG/ML IJ SOLN
1.0000 mg | INTRAMUSCULAR | Status: DC | PRN
  Administered 2024-03-20: 1 mg via INTRAVENOUS
  Filled 2024-03-20: qty 1

## 2024-03-20 MED ORDER — CLONIDINE HCL 0.2 MG PO TABS: 0.2000 mg | ORAL_TABLET | Freq: Three times a day (TID) | ORAL | Status: DC | PRN

## 2024-03-20 MED ORDER — METOPROLOL TARTRATE 25 MG PO TABS
25.0000 mg | ORAL_TABLET | Freq: Two times a day (BID) | ORAL | Status: DC
  Administered 2024-03-20 – 2024-03-21 (×2): 25 mg via ORAL
  Filled 2024-03-20 (×2): qty 1

## 2024-03-20 MED ORDER — LABETALOL HCL 5 MG/ML IV SOLN: 10.0000 mg | INTRAVENOUS | Status: DC | PRN

## 2024-03-20 MED ORDER — ALPRAZOLAM 0.25 MG PO TABS: 0.2500 mg | ORAL_TABLET | Freq: Three times a day (TID) | ORAL | Status: DC | PRN

## 2024-03-20 MED ORDER — HYDRALAZINE HCL 20 MG/ML IJ SOLN
10.0000 mg | Freq: Once | INTRAMUSCULAR | Status: AC
  Administered 2024-03-20: 10 mg via INTRAVENOUS
  Filled 2024-03-20: qty 1

## 2024-03-20 MED ORDER — CLONIDINE HCL 0.1 MG PO TABS
0.1000 mg | ORAL_TABLET | Freq: Three times a day (TID) | ORAL | Status: DC | PRN
  Administered 2024-03-21: 0.1 mg via ORAL
  Filled 2024-03-20: qty 1

## 2024-03-20 MED ORDER — ENALAPRILAT 1.25 MG/ML IV SOLN
1.2500 mg | Freq: Four times a day (QID) | INTRAVENOUS | Status: DC
  Administered 2024-03-20: 1.25 mg via INTRAVENOUS
  Filled 2024-03-20 (×3): qty 1

## 2024-03-20 MED ORDER — PANTOPRAZOLE SODIUM 40 MG IV SOLR
40.0000 mg | Freq: Two times a day (BID) | INTRAVENOUS | Status: DC
  Administered 2024-03-20 – 2024-03-25 (×11): 40 mg via INTRAVENOUS
  Filled 2024-03-20 (×11): qty 10

## 2024-03-20 MED ORDER — POTASSIUM CHLORIDE 10 MEQ/100ML IV SOLN
10.0000 meq | INTRAVENOUS | Status: AC
  Administered 2024-03-20 (×3): 10 meq via INTRAVENOUS
  Filled 2024-03-20: qty 100

## 2024-03-20 MED ORDER — AMLODIPINE BESYLATE 10 MG PO TABS
10.0000 mg | ORAL_TABLET | Freq: Every day | ORAL | Status: DC
  Administered 2024-03-20 – 2024-03-30 (×11): 10 mg via ORAL
  Filled 2024-03-20 (×11): qty 1

## 2024-03-20 MED ORDER — BOOST / RESOURCE BREEZE PO LIQD CUSTOM
1.0000 | Freq: Three times a day (TID) | ORAL | Status: DC
  Administered 2024-03-20 – 2024-03-24 (×8): 1 via ORAL
  Administered 2024-03-25: 237 mL via ORAL
  Administered 2024-03-28 – 2024-03-29 (×2): 1 via ORAL

## 2024-03-20 MED ORDER — METOPROLOL TARTRATE 5 MG/5ML IV SOLN
2.5000 mg | INTRAVENOUS | Status: AC
  Administered 2024-03-20: 2.5 mg via INTRAVENOUS

## 2024-03-20 MED ORDER — AMLODIPINE BESYLATE 10 MG PO TABS: 10.0000 mg | ORAL_TABLET | Freq: Every day | ORAL | Status: DC

## 2024-03-20 MED ORDER — LOSARTAN POTASSIUM 50 MG PO TABS
50.0000 mg | ORAL_TABLET | Freq: Every day | ORAL | Status: DC
  Administered 2024-03-20: 50 mg via ORAL
  Filled 2024-03-20: qty 1

## 2024-03-20 MED ORDER — METOPROLOL TARTRATE 25 MG PO TABS
25.0000 mg | ORAL_TABLET | Freq: Three times a day (TID) | ORAL | Status: DC
  Administered 2024-03-20: 25 mg via ORAL
  Filled 2024-03-20: qty 1

## 2024-03-20 MED ORDER — MORPHINE SULFATE (PF) 2 MG/ML IV SOLN
2.0000 mg | INTRAVENOUS | Status: AC
  Administered 2024-03-20: 2 mg via INTRAVENOUS

## 2024-03-20 NOTE — Progress Notes (Addendum)
 PROGRESS NOTE        PATIENT DETAILS Name: Denise Hudson Age: 63 y.o. Sex: female Date of Birth: Sep 18, 1960 Admit Date: 03/16/2024 Admitting Physician Micaela Speaker, MD PCP:Pcp, No  Brief Summary: Patient is a 63 y.o.  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  Significant events: 11/20>> admit to [facility]  Significant studies: 11/19>> CT abdomen/pelvis: SBO-transition zone seen as a loop of small bowel enters lateral  ventral hernia. 11/20>> echo: EF 60-65%  Significant microbiology data: 11/20>> blood culture: No growth  Procedures: 11/20>> expiratory laparotomy with small bowel resection.  Consults: General Surgery  Subjective: Appears uncomfortable/anxious-claims she is belching/burping quite a bit.  Not sure if she has passed flatus but denies having a bowel movement.  Blood pressure elevated overnight.  Multiple episodes of SVT overnight.  Objective: Vitals: Blood pressure (!) 214/95, pulse (!) 54, temperature 98.2 F (36.8 C), temperature source Oral, resp. rate 16, height 6' 1 (1.854 m), weight 67 kg, last menstrual period 09/23/2015, SpO2 95%.   Exam: Awake/alert Atraumatic/normocephalic Chest: Clear to auscultation Abdomen: Soft-still some tenderness around the perioperative area but does not appear to have peritoneal signs Extremities: No edema.  Pertinent Labs/Radiology:    Latest Ref Rng & Units 03/20/2024    3:29 AM 03/19/2024    4:21 AM 03/17/2024   12:50 AM  CBC  WBC 4.0 - 10.5 K/uL 7.0  7.4  5.9   Hemoglobin 12.0 - 15.0 g/dL 87.4  88.2  86.2   Hematocrit 36.0 - 46.0 % 39.5  37.9  43.9   Platelets 150 - 400 K/uL 255  231  231     Lab Results  Component Value  Date   NA 144 03/20/2024   K 3.3 (L) 03/20/2024   CL 110 03/20/2024   CO2 22 03/20/2024     Assessment/Plan: SBO secondary to incarcerated incisional hernia S/p exploratory laparotomy/bowel resection by general surgery on 11/20 Slowly improving NG tube/Foley discontinued 11/22 Discussed with general surgery-okay for oral medications at this point. General surgery following-Will defer further postop care to general surgery service.  Paroxysmal supraventricular tachycardia Seen on initial presentation in the ED Given 2 doses of adenosine  with restoration of sinus rhythm-however continues to have episodes of nonsustained SVT overnight. Change IV metoprolol  to oral Telemetry monitoring Echo/TSH stable  Hypertensive urgency Blood pressure significantly elevated overnight She appears to have some pain and appears to be somewhat anxious that is probably playing a role. Since we can now start oral medications-Will place on oral amlodipine /beta-blocker and see how she does On as needed intravenous labetalol /intravenous hydralazine /oral clonidine  Watch closely-and optimize antihypertensive regimen.  Continue to attempt pain control/control anxiety.  AKI Mild Likely hemodynamically mediated Has resolved with supportive care.  Hypokalemia Repleted.  Code status:   Code Status: Full Code   DVT Prophylaxis: enoxaparin  (LOVENOX ) injection 40 mg Start: 03/18/24 1630 SCDs Start: 03/17/24 0023 Place TED hose Start: 03/17/24 0023   Family Communication: None at bedside   Disposition Plan: Status is: Inpatient Remains inpatient appropriate because: Severity of illness   Planned Discharge Destination:Home   Diet: Diet Order  Diet clear liquid Fluid consistency: Thin  Diet effective now                     Antimicrobial agents: Anti-infectives (From admission, onward)    Start     Dose/Rate Route Frequency Ordered Stop   03/17/24 0600   piperacillin -tazobactam (ZOSYN ) IVPB 3.375 g        3.375 g 12.5 mL/hr over 240 Minutes Intravenous Every 8 hours 03/17/24 0018     03/17/24 0030  piperacillin -tazobactam (ZOSYN ) IVPB 3.375 g        3.375 g 100 mL/hr over 30 Minutes Intravenous  Once 03/17/24 0018 03/17/24 0134        MEDICATIONS: Scheduled Meds:  acetaminophen   1,000 mg Oral Q6H   Or   acetaminophen   650 mg Rectal Q6H   amLODipine   10 mg Oral Daily   Chlorhexidine  Gluconate Cloth  6 each Topical Daily   enalaprilat   1.25 mg Intravenous Q6H   enoxaparin  (LOVENOX ) injection  40 mg Subcutaneous Q24H   methocarbamol  (ROBAXIN ) injection  1,000 mg Intravenous Q8H   metoprolol  tartrate  25 mg Oral TID   pantoprazole  (PROTONIX ) IV  40 mg Intravenous Q12H   sodium chloride  flush  3 mL Intravenous Q12H   Continuous Infusions:  piperacillin -tazobactam (ZOSYN )  IV 3.375 g (03/20/24 0639)   potassium chloride      PRN Meds:.ALPRAZolam , cloNIDine , hydrALAZINE , HYDROmorphone  (DILAUDID ) injection, labetalol , morphine  injection, ondansetron  **OR** ondansetron  (ZOFRAN ) IV, oxyCODONE , phenol, sodium chloride  flush   I have personally reviewed following labs and imaging studies  LABORATORY DATA: CBC: Recent Labs  Lab 03/16/24 2215 03/16/24 2222 03/17/24 0050 03/19/24 0421 03/20/24 0329  WBC 7.3  --  5.9 7.4 7.0  HGB 15.2* 18.0* 13.7 11.7* 12.5  HCT 48.2* 53.0* 43.9 37.9 39.5  MCV 79.0*  --  80.0 79.8* 78.2*  PLT 245  --  231 231 255    Basic Metabolic Panel: Recent Labs  Lab 03/16/24 2215 03/16/24 2222 03/17/24 0050 03/19/24 0421 03/20/24 0329  NA 137 138 137 144 144  K 3.5 3.5 3.4* 3.9 3.3*  CL 99 103 101 109 110  CO2 18*  --  23 24 22   GLUCOSE 130* 134* 113* 91 100*  BUN 50* 52* 45* 21 22  CREATININE 1.31* 1.50* 1.18* 0.93 0.81  CALCIUM 9.6  --  8.4* 8.5* 8.7*  MG  --   --   --  2.1  --   PHOS  --   --   --  2.5  --     GFR: Estimated Creatinine Clearance: 77.1 mL/min (by C-G formula based on SCr  of 0.81 mg/dL).  Liver Function Tests: Recent Labs  Lab 03/16/24 2215 03/17/24 0050 03/19/24 0421  AST 60* 50* 24  ALT 52* 44 25  ALKPHOS 119 103 77  BILITOT 2.1* 1.7* 1.8*  PROT 7.9 7.0 5.8*  ALBUMIN  3.8 3.2* 2.5*   Recent Labs  Lab 03/16/24 2215  LIPASE 34   No results for input(s): AMMONIA in the last 168 hours.  Coagulation Profile: No results for input(s): INR, PROTIME in the last 168 hours.  Cardiac Enzymes: No results for input(s): CKTOTAL, CKMB, CKMBINDEX, TROPONINI in the last 168 hours.  BNP (last 3 results) No results for input(s): PROBNP in the last 8760 hours.  Lipid Profile: No results for input(s): CHOL, HDL, LDLCALC, TRIG, CHOLHDL, LDLDIRECT in the last 72 hours.  Thyroid Function Tests: No results for input(s): TSH, T4TOTAL, FREET4, T3FREE, THYROIDAB in  the last 72 hours.   Anemia Panel: No results for input(s): VITAMINB12, FOLATE, FERRITIN, TIBC, IRON, RETICCTPCT in the last 72 hours.  Urine analysis:    Component Value Date/Time   COLORURINE YELLOW 03/16/2024 2359   APPEARANCEUR HAZY (A) 03/16/2024 2359   LABSPEC 1.040 (H) 03/16/2024 2359   PHURINE 6.0 03/16/2024 2359   GLUCOSEU NEGATIVE 03/16/2024 2359   HGBUR SMALL (A) 03/16/2024 2359   BILIRUBINUR NEGATIVE 03/16/2024 2359   KETONESUR NEGATIVE 03/16/2024 2359   PROTEINUR 30 (A) 03/16/2024 2359   UROBILINOGEN 1.0 01/02/2015 0043   NITRITE NEGATIVE 03/16/2024 2359   LEUKOCYTESUR LARGE (A) 03/16/2024 2359    Sepsis Labs: Lactic Acid, Venous    Component Value Date/Time   LATICACIDVEN 2.1 (HH) 03/17/2024 0026    MICROBIOLOGY: Recent Results (from the past 240 hours)  Culture, blood (Routine X 2) w Reflex to ID Panel     Status: None (Preliminary result)   Collection Time: 03/17/24 12:40 AM   Specimen: BLOOD RIGHT ARM  Result Value Ref Range Status   Specimen Description BLOOD RIGHT ARM  Final   Special Requests   Final     BOTTLES DRAWN AEROBIC AND ANAEROBIC Blood Culture adequate volume   Culture   Final    NO GROWTH 2 DAYS Performed at [facility], [address], [place], [place] [address]    Report Status PENDING  Incomplete  Culture, blood (Routine X 2) w Reflex to ID Panel     Status: None (Preliminary result)   Collection Time: 03/17/24 12:50 AM   Specimen: BLOOD  Result Value Ref Range Status   Specimen Description BLOOD LEFT ANTECUBITAL  Final   Special Requests   Final    BOTTLES DRAWN AEROBIC AND ANAEROBIC Blood Culture adequate volume   Culture   Final    NO GROWTH 2 DAYS Performed at [facility], [address], [place], [place] [address]    Report Status PENDING  Incomplete    RADIOLOGY STUDIES/RESULTS: No results found.    LOS: 3 days   Donalda Applebaum, MD  [facility]    To contact the attending provider between 7A-7P or the covering provider during after hours 7P-7A, please log into the web site [email or web address] and access using universal [place] password for that web site. If you do not have the password, please call the hospital operator.  03/20/2024, 10:32 AM

## 2024-03-20 NOTE — Progress Notes (Signed)
 3 Days Post-Op   Subjective/Chief Complaint: NG tube out.  No nausea vomiting   Objective: Vital signs in last 24 hours: Temp:  [98 F (36.7 C)-98.6 F (37 C)] 98.2 F (36.8 C) (11/23 0741) Pulse Rate:  [54-68] 54 (11/23 0000) Resp:  [15-16] 16 (11/23 0000) BP: (139-220)/(60-95) 214/95 (11/23 0832) SpO2:  [94 %-96 %] 95 % (11/23 0000)    Intake/Output from previous day: 11/22 0701 - 11/23 0700 In: 2547.5 [I.V.:2147.5; IV Piggyback:400] Out: 250 [Urine:200; Emesis/NG output:50] Intake/Output this shift: No intake/output data recorded.  Abdomen: Incision clean dry intact honeycomb dressing in place.  Mild distention without rebound or peritonitis  Lab Results:  Recent Labs    03/19/24 0421 03/20/24 0329  WBC 7.4 7.0  HGB 11.7* 12.5  HCT 37.9 39.5  PLT 231 255   BMET Recent Labs    03/19/24 0421 03/20/24 0329  NA 144 144  K 3.9 3.3*  CL 109 110  CO2 24 22  GLUCOSE 91 100*  BUN 21 22  CREATININE 0.93 0.81  CALCIUM 8.5* 8.7*   PT/INR No results for input(s): LABPROT, INR in the last 72 hours. ABG No results for input(s): PHART, HCO3 in the last 72 hours.  Invalid input(s): PCO2, PO2  Studies/Results: No results found.  Anti-infectives: Anti-infectives (From admission, onward)    Start     Dose/Rate Route Frequency Ordered Stop   03/17/24 0600  piperacillin -tazobactam (ZOSYN ) IVPB 3.375 g        3.375 g 12.5 mL/hr over 240 Minutes Intravenous Every 8 hours 03/17/24 0018     03/17/24 0030  piperacillin -tazobactam (ZOSYN ) IVPB 3.375 g        3.375 g 100 mL/hr over 30 Minutes Intravenous  Once 03/17/24 0018 03/17/24 0134       Assessment/Plan: s/p Procedure(s) with comments: LAPAROTOMY, EXPLORATORY SMALL BOWEL RESECTION WOUND EXPLORATION (N/A) - Repair of incarcerated Incisional hernia POD 3 : LAPAROTOMY, EXPLORATORY, SMALL BOWEL RESECTION, WOUND EXPLORATION (N/A) - Repair of incarcerated Incisional hernia by Dr. Richerd Silversmith on  11/20 -Hypertensive. Afebrile, WBC 7. Noted hgb 11.7 from 13.7, trend.  -Advance diet to clears -Multi-modal pain control: schedule tylenol , increase robaxin  to 1,000 mg, add oxy 5 mg q 4h PRN.  -ok to D/C foley from [facility] standpoint. -Will continue to follow.   FEN: NPO/NGT; IVF per primary team; AKI resolved, HTN  VTE: None currently Foley: in place, UOP 1,025 mL  ID: [email or web address]   LOS: 3 days    Debby DELENA Shipper MD  03/20/2024

## 2024-03-20 NOTE — Plan of Care (Signed)

## 2024-03-21 DIAGNOSIS — I471 Supraventricular tachycardia, unspecified: Secondary | ICD-10-CM | POA: Diagnosis not present

## 2024-03-21 DIAGNOSIS — I10 Essential (primary) hypertension: Secondary | ICD-10-CM | POA: Diagnosis not present

## 2024-03-21 DIAGNOSIS — K460 Unspecified abdominal hernia with obstruction, without gangrene: Secondary | ICD-10-CM | POA: Diagnosis not present

## 2024-03-21 DIAGNOSIS — I159 Secondary hypertension, unspecified: Secondary | ICD-10-CM | POA: Diagnosis not present

## 2024-03-21 LAB — CBC
HCT: 35.6 % — ABNORMAL LOW (ref 36.0–46.0)
Hemoglobin: 11.2 g/dL — ABNORMAL LOW (ref 12.0–15.0)
MCH: 24.4 pg — ABNORMAL LOW (ref 26.0–34.0)
MCHC: 31.5 g/dL (ref 30.0–36.0)
MCV: 77.6 fL — ABNORMAL LOW (ref 80.0–100.0)
Platelets: 267 K/uL (ref 150–400)
RBC: 4.59 MIL/uL (ref 3.87–5.11)
RDW: 14.1 % (ref 11.5–15.5)
WBC: 5.9 K/uL (ref 4.0–10.5)
nRBC: 0 % (ref 0.0–0.2)

## 2024-03-21 LAB — BASIC METABOLIC PANEL WITH GFR
Anion gap: 11 (ref 5–15)
BUN: 19 mg/dL (ref 8–23)
CO2: 21 mmol/L — ABNORMAL LOW (ref 22–32)
Calcium: 8.3 mg/dL — ABNORMAL LOW (ref 8.9–10.3)
Chloride: 108 mmol/L (ref 98–111)
Creatinine, Ser: 0.81 mg/dL (ref 0.44–1.00)
GFR, Estimated: >60 mL/min (ref >60)
Glucose, Bld: 100 mg/dL — ABNORMAL HIGH (ref 70–99)
Potassium: 3 mmol/L — ABNORMAL LOW (ref 3.5–5.1)
Sodium: 140 mmol/L (ref 135–145)

## 2024-03-21 LAB — SURGICAL PATHOLOGY

## 2024-03-21 LAB — MAGNESIUM: Magnesium: 1.8 mg/dL (ref 1.7–2.4)

## 2024-03-21 MED ORDER — METOPROLOL TARTRATE 12.5 MG HALF TABLET
12.5000 mg | ORAL_TABLET | Freq: Two times a day (BID) | ORAL | Status: DC
  Administered 2024-03-22: 12.5 mg via ORAL
  Filled 2024-03-21 (×2): qty 1

## 2024-03-21 MED ORDER — HYDRALAZINE HCL 50 MG PO TABS
50.0000 mg | ORAL_TABLET | Freq: Three times a day (TID) | ORAL | Status: DC
  Administered 2024-03-21 – 2024-03-25 (×12): 50 mg via ORAL
  Filled 2024-03-21 (×13): qty 1

## 2024-03-21 MED ORDER — MAGNESIUM SULFATE 2 GM/50ML IV SOLN
2.0000 g | Freq: Once | INTRAVENOUS | Status: AC
  Administered 2024-03-21: 2 g via INTRAVENOUS
  Filled 2024-03-21: qty 50

## 2024-03-21 MED ORDER — HYDRALAZINE HCL 25 MG PO TABS: 25.0000 mg | ORAL_TABLET | Freq: Three times a day (TID) | ORAL | Status: DC

## 2024-03-21 MED ORDER — POTASSIUM CHLORIDE CRYS ER 20 MEQ PO TBCR
40.0000 meq | EXTENDED_RELEASE_TABLET | ORAL | Status: AC
  Administered 2024-03-21 (×2): 40 meq via ORAL
  Filled 2024-03-21 (×2): qty 2

## 2024-03-21 MED ORDER — LOSARTAN POTASSIUM 50 MG PO TABS
100.0000 mg | ORAL_TABLET | Freq: Every day | ORAL | Status: DC
  Administered 2024-03-21 – 2024-03-23 (×3): 100 mg via ORAL
  Filled 2024-03-21 (×3): qty 2

## 2024-03-21 NOTE — Progress Notes (Signed)
 Progress Note  4 Days Post-Op  Subjective: Patient reports abdominal soreness that is manageable. Tolerating CLD but no bowel function.    Objective: Vital signs in last 24 hours: Temp:  [97.2 F (36.2 C)-98.6 F (37 C)] 98 F (36.7 C) (11/24 0734) Pulse Rate:  [47-65] 47 (11/23 2300) Resp:  [16] 16 (11/23 2300) BP: (144-206)/(67-92) 166/74 (11/24 0734) SpO2:  [96 %] 96 % (11/23 2300) Last BM Date :  (PTA)  Intake/Output from previous day: No intake/output data recorded. Intake/Output this shift: No intake/output data recorded.  PE: General: Pleasant female who is laying in bed in NAD. Heart: HR normal during encounter.  Lungs: Respiratory effort nonlabored. Abd: Soft. Mild distention. Appropriately tender, Abdominal binder present. Midline incision present with staples that is C/D/I. Honeycomb dressing covering. Right lower abdominal incision with staples that is C/D/I covered with additional honeycomb dressing. Psych: A&Ox3 with an appropriate affect.    Lab Results:  Recent Labs    03/20/24 0329 03/21/24 0326  WBC 7.0 5.9  HGB 12.5 11.2*  HCT 39.5 35.6*  PLT 255 267   BMET Recent Labs    03/20/24 0329 03/21/24 0326  NA 144 140  K 3.3* 3.0*  CL 110 108  CO2 22 21*  GLUCOSE 100* 100*  BUN 22 19  CREATININE 0.81 0.81  CALCIUM 8.7* 8.3*   PT/INR No results for input(s): LABPROT, INR in the last 72 hours. CMP     Component Value Date/Time   NA 140 03/21/2024 0326   K 3.0 (L) 03/21/2024 0326   CL 108 03/21/2024 0326   CO2 21 (L) 03/21/2024 0326   GLUCOSE 100 (H) 03/21/2024 0326   BUN 19 03/21/2024 0326   CREATININE 0.81 03/21/2024 0326   CREATININE 0.91 11/06/2022 0148   CALCIUM 8.3 (L) 03/21/2024 0326   PROT 5.8 (L) 03/19/2024 0421   ALBUMIN  2.5 (L) 03/19/2024 0421   AST 24 03/19/2024 0421   ALT 25 03/19/2024 0421   ALKPHOS 77 03/19/2024 0421   BILITOT 1.8 (H) 03/19/2024 0421   GFRNONAA >60 03/21/2024 0326   GFRAA 58 (L) 10/18/2019  1255   Lipase     Component Value Date/Time   LIPASE 34 03/16/2024 2215       Studies/Results: No results found.   Anti-infectives: Anti-infectives (From admission, onward)    Start     Dose/Rate Route Frequency Ordered Stop   03/17/24 0600  piperacillin -tazobactam (ZOSYN ) IVPB 3.375 g        3.375 g 12.5 mL/hr over 240 Minutes Intravenous Every 8 hours 03/17/24 0018     03/17/24 0030  piperacillin -tazobactam (ZOSYN ) IVPB 3.375 g        3.375 g 100 mL/hr over 30 Minutes Intravenous  Once 03/17/24 0018 03/17/24 0134        Assessment/Plan POD4: LAPAROTOMY, EXPLORATORY, SMALL BOWEL RESECTION, WOUND EXPLORATION (N/A) - Repair of incarcerated Incisional hernia by Dr. Richerd Silversmith on 11/20 - Hypertensive. Afebrile, WBC 5. Hgb stable at 11.2 - continue CLD until more bowel function  - encouraged ambulation  - Multi-modal pain control: schedule tylenol , increase robaxin  to 1,000 mg, continue oxy 5 mg q 4h PRN.  - Keep K >4.0, Mg >2.0 and Phos >3.0 to optimize bowel function    FEN: CLD, SLIV VTE: LMWH ID: [email or web address]  ok to stop tomorrow     LOS: 4 days    Burnard JONELLE Louder, [facility] 03/21/2024, 10:55 AM Please see Amion for pager number during day hours  7:00am-4:30pm

## 2024-03-21 NOTE — Plan of Care (Signed)
  Problem: Education: Goal: Knowledge of General Education information will improve Description: Including pain rating scale, medication(s)/side effects and non-pharmacologic comfort measures Outcome: Progressing   Problem: Health Behavior/Discharge Planning: Goal: Ability to manage health-related needs will improve Outcome: Progressing   Problem: Clinical Measurements: Goal: Ability to maintain clinical measurements within normal limits will improve Outcome: Progressing Goal: Diagnostic test results will improve Outcome: Progressing   Problem: Activity: Goal: Risk for activity intolerance will decrease Outcome: Progressing   Problem: Nutrition: Goal: Adequate nutrition will be maintained Outcome: Progressing   Problem: Elimination: Goal: Will not experience complications related to bowel motility Outcome: Progressing

## 2024-03-21 NOTE — Progress Notes (Signed)
 Physical Therapy Treatment Patient Details Name: Denise Hudson MRN: [id number] DOB: 03-10-1961 Today's Date: 03/21/2024   History of Present Illness 63 yo female presents to [facility]/19 with R abdominal pain, n/v. CT shows incisional hernia with inflamed mesenteric fat, fluid and short segment of thickened small bowel with narrowing as it enters hernia. S/p open incisional hernia repair, ex lap, small bowel resection 11/21. 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    PT Comments  Pt seen for PT tx with pt agreeable, reporting no pain just slight nausea. Pt is progressing very well with mobility, completing bed mobility with mod I, ambulating around unit without AD with supervision<>mod I.  Pt reports she plans to d/c to her brother's house (level entry, no steps to negotiate).    If plan is discharge home, recommend the following: Assistance with cooking/housework;Assist for transportation   Can travel by private vehicle        Equipment Recommendations  None recommended by PT    Recommendations for Other Services       Precautions / Restrictions Precautions Precautions: Fall;Other (comment) Recall of Precautions/Restrictions: Intact Precaution/Restrictions Comments: abdominal - log roll in and out of bed; periods of tachycardia up to 170's that quickly resolves to 80s bpm Restrictions Weight Bearing Restrictions Per Provider Order: No     Mobility  Bed Mobility Overal bed mobility: Needs Assistance Bed Mobility: Supine to Sit     Supine to sit: Modified independent (Device/Increase time), HOB elevated (exit R side of bed)   Sit to sidelying: Modified independent (Device/Increase time), HOB elevated      Transfers Overall transfer level: Needs assistance Equipment used: None Transfers: Sit  to/from Stand Sit to Stand: Supervision, Modified independent (Device/Increase time)                Ambulation/Gait Ambulation/Gait assistance: Supervision, Modified independent (Device/Increase time) Gait Distance (Feet):  (>150 ft) Assistive device: None Gait Pattern/deviations: Decreased stride length, Step-through pattern Gait velocity: slightly decreased     General Gait Details: no overt LOB   Stairs             Wheelchair Mobility     Tilt Bed    Modified Rankin (Stroke Patients Only)       Balance Overall balance assessment: Needs assistance Sitting-balance support: Bilateral upper extremity supported, Feet supported Sitting balance-Leahy Scale: Good     Standing balance support: During functional activity, No upper extremity supported Standing balance-Leahy Scale: Good                              Communication Communication Communication: No apparent difficulties  Cognition Arousal: Alert Behavior During Therapy: WFL for tasks assessed/performed   PT - Cognitive impairments: No apparent impairments                         Following commands: Intact Following commands impaired: Follows multi-step commands with increased time    Cueing Cueing Techniques: Verbal cues, Gestural cues  Exercises Other Exercises Other Exercises: Pt performs 5x sit<>stand from EOB without BUE support with focus on BLE strengthening, endurance training, transfer technique. PT cued pt for less rocking momentum & focus more on anterior weight shift with good return demo but pt noting increased difficulty with this technique.    General Comments General  comments (skin integrity, edema, etc.): VSS throughout session, no c/o pain      Pertinent Vitals/Pain Pain Assessment Pain Assessment: No/denies pain    Home Living                          Prior Function            PT Goals (current goals can now be found in the care plan  section) Acute Rehab PT Goals PT Goal Formulation: With patient Time For Goal Achievement: 04/01/24 Potential to Achieve Goals: Good Progress towards PT goals: Progressing toward goals    Frequency    Min 1X/week (downgrade 2/2 great progress with mobility)      PT Plan      Co-evaluation              AM-PAC PT 6 Clicks Mobility   Outcome Measure  Help needed turning from your back to your side while in a flat bed without using bedrails?: None Help needed moving from lying on your back to sitting on the side of a flat bed without using bedrails?: None Help needed moving to and from a bed to a chair (including a wheelchair)?: None Help needed standing up from a chair using your arms (e.g., wheelchair or bedside chair)?: None Help needed to walk in hospital room?: None Help needed climbing 3-5 steps with a railing? : None 6 Click Score: 24    End of Session   Activity Tolerance: Patient tolerated treatment well Patient left: in bed;with call bell/phone within reach Nurse Communication: Mobility status PT Visit Diagnosis: Muscle weakness (generalized) (M62.81)     Time: 8567-8556 PT Time Calculation (min) (ACUTE ONLY): 11 min  Charges:    $Therapeutic Activity: 8-22 mins PT General Charges $$ ACUTE PT VISIT: 1 Visit                     Richerd Pinal, PT, DPT 03/21/24, 2:48 PM   Richerd CHRISTELLA Pinal 03/21/2024, 2:47 PM

## 2024-03-21 NOTE — Plan of Care (Signed)

## 2024-03-21 NOTE — Progress Notes (Signed)
 PROGRESS NOTE        PATIENT DETAILS Name: Denise Hudson Age: 63 y.o. Sex: female Date of Birth: 02/16/1961 Admit Date: 03/16/2024 Admitting Physician Micaela Speaker, MD PCP:Pcp, No  Brief Summary: Patient is a 63 y.o.  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  Significant events: 11/20>> admit to [facility]  Significant studies: 11/19>> CT abdomen/pelvis: SBO-transition zone seen as a loop of small bowel enters lateral  ventral hernia. 11/20>> echo: EF 60-65%  Significant microbiology data: 11/20>> blood culture: No growth  Procedures: 11/20>> expiratory laparotomy with small bowel resection.  Consults: General Surgery  Subjective: Appears much more better today-tolerating clear liquids-she is not sure if she passed flatus.  Has not had a bowel movement today.  Abdominal pain/postoperative pain has improved quite a bit.  Objective: Vitals: Blood pressure (!) 166/74, pulse (!) 47, temperature 98 F (36.7 C), temperature source Oral, resp. rate 16, height 6' 1 (1.854 m), weight 67 kg, last menstrual period 09/23/2015, SpO2 96%.   Exam: Awake/alert Chest: Clear to auscultation Abdomen: Soft-appropriately tender around the operative site Extremities: No edema Nonfocal exam.  Pertinent Labs/Radiology:    Latest Ref Rng & Units 03/21/2024    3:26 AM 03/20/2024    3:29 AM 03/19/2024    4:21 AM  CBC  WBC 4.0 - 10.5 K/uL 5.9  7.0  7.4   Hemoglobin 12.0 - 15.0 g/dL 88.7  87.4  88.2   Hematocrit 36.0 - 46.0 % 35.6  39.5  37.9   Platelets 150 - 400 K/uL 267  255  231     Lab Results  Component Value Date   NA 140 03/21/2024   K 3.0 (L) 03/21/2024   CL 108 03/21/2024   CO2 21 (L)  03/21/2024     Assessment/Plan: SBO secondary to incarcerated incisional hernia S/p exploratory laparotomy/bowel resection by general surgery on 11/20 Slowly improving NG tube/Foley discontinued 11/22 Awaiting return of bowel function but tolerating clear liquids without any issues. General surgery following-Will defer further postop care to general surgery service.  Paroxysmal supraventricular tachycardia Seen on initial presentation in the ED-required 2 doses of adenosine  with restoration of sinus rhythm Has had intermittent episodes of SVT-suspect driven by pain/acute illness-maintaining sinus rhythm this morning Continue oral metoprolol .  Hypertensive urgency Blood pressure better controlled-still on the higher side Continue amlodipine  Increase losartan  to 100 mg Add low-dose hydralazine  Remains on beta-blocker-unable to increase dosage due to mild bradycardia.  will need to be placed on antihypertensives on discharge-she was not on any blood pressure medications prior to this presentation.  AKI Mild Likely hemodynamically mediated Has resolved with supportive care.  Hypokalemia Continue to replete/recheck.  Code status:   Code Status: Full Code   DVT Prophylaxis: enoxaparin  (LOVENOX ) injection 40 mg Start: 03/18/24 1630 SCDs Start: 03/17/24 0023 Place TED hose Start: 03/17/24 0023   Family Communication: None at bedside   Disposition Plan: Status is: Inpatient Remains inpatient appropriate because: Severity of illness   Planned Discharge Destination:Home   Diet: Diet Order             Diet clear liquid Fluid consistency: Thin  Diet effective now  Antimicrobial agents: Anti-infectives (From admission, onward)    Start     Dose/Rate Route Frequency Ordered Stop   03/17/24 0600  piperacillin -tazobactam (ZOSYN ) IVPB 3.375 g        3.375 g 12.5 mL/hr over 240 Minutes Intravenous Every 8 hours 03/17/24 0018     03/17/24 0030   piperacillin -tazobactam (ZOSYN ) IVPB 3.375 g        3.375 g 100 mL/hr over 30 Minutes Intravenous  Once 03/17/24 0018 03/17/24 0134        MEDICATIONS: Scheduled Meds:  acetaminophen   1,000 mg Oral Q6H   Or   acetaminophen   650 mg Rectal Q6H   amLODipine   10 mg Oral Daily   Chlorhexidine  Gluconate Cloth  6 each Topical Daily   enoxaparin  (LOVENOX ) injection  40 mg Subcutaneous Q24H   feeding supplement  1 Container Oral TID BM   hydrALAZINE   50 mg Oral Q8H   losartan   100 mg Oral Daily   methocarbamol  (ROBAXIN ) injection  1,000 mg Intravenous Q8H   metoprolol  tartrate  25 mg Oral BID   pantoprazole  (PROTONIX ) IV  40 mg Intravenous Q12H   sodium chloride  flush  3 mL Intravenous Q12H   Continuous Infusions:  piperacillin -tazobactam (ZOSYN )  IV 3.375 g (03/21/24 0537)   PRN Meds:.ALPRAZolam , cloNIDine , hydrALAZINE , HYDROmorphone  (DILAUDID ) injection, labetalol , morphine  injection, ondansetron  **OR** ondansetron  (ZOFRAN ) IV, oxyCODONE , phenol, sodium chloride  flush   I have personally reviewed following labs and imaging studies  LABORATORY DATA: CBC: Recent Labs  Lab 03/16/24 2215 03/16/24 2222 03/17/24 0050 03/19/24 0421 03/20/24 0329 03/21/24 0326  WBC 7.3  --  5.9 7.4 7.0 5.9  HGB 15.2* 18.0* 13.7 11.7* 12.5 11.2*  HCT 48.2* 53.0* 43.9 37.9 39.5 35.6*  MCV 79.0*  --  80.0 79.8* 78.2* 77.6*  PLT 245  --  231 231 255 267    Basic Metabolic Panel: Recent Labs  Lab 03/16/24 2215 03/16/24 2222 03/17/24 0050 03/19/24 0421 03/20/24 0329 03/21/24 0326  NA 137 138 137 144 144 140  K 3.5 3.5 3.4* 3.9 3.3* 3.0*  CL 99 103 101 109 110 108  CO2 18*  --  23 24 22  21*  GLUCOSE 130* 134* 113* 91 100* 100*  BUN 50* 52* 45* 21 22 19   CREATININE 1.31* 1.50* 1.18* 0.93 0.81 0.81  CALCIUM 9.6  --  8.4* 8.5* 8.7* 8.3*  MG  --   --   --  2.1  --  1.8  PHOS  --   --   --  2.5  --   --     GFR: Estimated Creatinine Clearance: 77.1 mL/min (by C-G formula based on SCr of  0.81 mg/dL).  Liver Function Tests: Recent Labs  Lab 03/16/24 2215 03/17/24 0050 03/19/24 0421  AST 60* 50* 24  ALT 52* 44 25  ALKPHOS 119 103 77  BILITOT 2.1* 1.7* 1.8*  PROT 7.9 7.0 5.8*  ALBUMIN  3.8 3.2* 2.5*   Recent Labs  Lab 03/16/24 2215  LIPASE 34   No results for input(s): AMMONIA in the last 168 hours.  Coagulation Profile: No results for input(s): INR, PROTIME in the last 168 hours.  Cardiac Enzymes: No results for input(s): CKTOTAL, CKMB, CKMBINDEX, TROPONINI in the last 168 hours.  BNP (last 3 results) No results for input(s): PROBNP in the last 8760 hours.  Lipid Profile: No results for input(s): CHOL, HDL, LDLCALC, TRIG, CHOLHDL, LDLDIRECT in the last 72 hours.  Thyroid Function Tests: No results for input(s): TSH, T4TOTAL, FREET4,  T3FREE, THYROIDAB in the last 72 hours.   Anemia Panel: No results for input(s): VITAMINB12, FOLATE, FERRITIN, TIBC, IRON, RETICCTPCT in the last 72 hours.  Urine analysis:    Component Value Date/Time   COLORURINE YELLOW 03/16/2024 2359   APPEARANCEUR HAZY (A) 03/16/2024 2359   LABSPEC 1.040 (H) 03/16/2024 2359   PHURINE 6.0 03/16/2024 2359   GLUCOSEU NEGATIVE 03/16/2024 2359   HGBUR SMALL (A) 03/16/2024 2359   BILIRUBINUR NEGATIVE 03/16/2024 2359   KETONESUR NEGATIVE 03/16/2024 2359   PROTEINUR 30 (A) 03/16/2024 2359   UROBILINOGEN 1.0 01/02/2015 0043   NITRITE NEGATIVE 03/16/2024 2359   LEUKOCYTESUR LARGE (A) 03/16/2024 2359    Sepsis Labs: Lactic Acid, Venous    Component Value Date/Time   LATICACIDVEN 2.1 (HH) 03/17/2024 0026    MICROBIOLOGY: Recent Results (from the past 240 hours)  Culture, blood (Routine X 2) w Reflex to ID Panel     Status: None (Preliminary result)   Collection Time: 03/17/24 12:40 AM   Specimen: BLOOD RIGHT ARM  Result Value Ref Range Status   Specimen Description BLOOD RIGHT ARM  Final   Special Requests   Final    BOTTLES  DRAWN AEROBIC AND ANAEROBIC Blood Culture adequate volume   Culture   Final    NO GROWTH 4 DAYS Performed at [facility], [address] [place], [place] [address]    Report Status PENDING  Incomplete  Culture, blood (Routine X 2) w Reflex to ID Panel     Status: None (Preliminary result)   Collection Time: 03/17/24 12:50 AM   Specimen: BLOOD  Result Value Ref Range Status   Specimen Description BLOOD LEFT ANTECUBITAL  Final   Special Requests   Final    BOTTLES DRAWN AEROBIC AND ANAEROBIC Blood Culture adequate volume   Culture   Final    NO GROWTH 4 DAYS Performed at [facility], [address], [place], [place] [address]    Report Status PENDING  Incomplete    RADIOLOGY STUDIES/RESULTS: No results found.    LOS: 4 days   Donalda Applebaum, MD  [facility]    To contact the attending provider between 7A-7P or the covering provider during after hours 7P-7A, please log into the web site [email or web address] and access using universal [place] password for that web site. If you do not have the password, please call the hospital operator.  03/21/2024, 9:45 AM

## 2024-03-21 NOTE — TOC Progression Note (Signed)
 Transition of Care [facility]) - Progression Note    Patient Details  Name: Denise Hudson MRN: [id number] Date of Birth: [birth year]  [facility] CM/SW Contact  [name], RN Phone Number: 03/21/2024, 2:38 PM  Clinical Narrative:     [facility] referrals sent in hub. CM will continue to follow.                     Expected Discharge Plan and Services                                               Social Drivers of Health (SDOH) Interventions SDOH Screenings   Food Insecurity: No Food Insecurity (03/17/2024)  Housing: Low Risk  (03/17/2024)  Transportation Needs: No Transportation Needs (03/17/2024)  Utilities: Not At Risk (03/17/2024)  Depression (PHQ2-9): Low Risk  (12/11/2022)  Tobacco Use: Medium Risk (03/17/2024)    Readmission Risk Interventions     No data to display

## 2024-03-22 ENCOUNTER — Inpatient Hospital Stay (HOSPITAL_COMMUNITY)

## 2024-03-22 DIAGNOSIS — I10 Essential (primary) hypertension: Secondary | ICD-10-CM | POA: Diagnosis not present

## 2024-03-22 DIAGNOSIS — K460 Unspecified abdominal hernia with obstruction, without gangrene: Secondary | ICD-10-CM | POA: Diagnosis not present

## 2024-03-22 DIAGNOSIS — K56609 Unspecified intestinal obstruction, unspecified as to partial versus complete obstruction: Secondary | ICD-10-CM | POA: Diagnosis not present

## 2024-03-22 DIAGNOSIS — I471 Supraventricular tachycardia, unspecified: Secondary | ICD-10-CM | POA: Diagnosis not present

## 2024-03-22 DIAGNOSIS — I159 Secondary hypertension, unspecified: Secondary | ICD-10-CM | POA: Diagnosis not present

## 2024-03-22 DIAGNOSIS — I479 Paroxysmal tachycardia, unspecified: Secondary | ICD-10-CM

## 2024-03-22 LAB — BASIC METABOLIC PANEL WITH GFR
Anion gap: 8 (ref 5–15)
BUN: 19 mg/dL (ref 8–23)
CO2: 22 mmol/L (ref 22–32)
Calcium: 8.6 mg/dL — ABNORMAL LOW (ref 8.9–10.3)
Chloride: 112 mmol/L — ABNORMAL HIGH (ref 98–111)
Creatinine, Ser: 0.87 mg/dL (ref 0.44–1.00)
GFR, Estimated: >60 mL/min (ref >60)
Glucose, Bld: 185 mg/dL — ABNORMAL HIGH (ref 70–99)
Potassium: 3.3 mmol/L — ABNORMAL LOW (ref 3.5–5.1)
Sodium: 142 mmol/L (ref 135–145)

## 2024-03-22 LAB — MAGNESIUM: Magnesium: 1.9 mg/dL (ref 1.7–2.4)

## 2024-03-22 LAB — CULTURE, BLOOD (ROUTINE X 2)
Culture: NO GROWTH
Culture: NO GROWTH
Special Requests: ADEQUATE
Special Requests: ADEQUATE

## 2024-03-22 MED ORDER — POTASSIUM CHLORIDE CRYS ER 20 MEQ PO TBCR
40.0000 meq | EXTENDED_RELEASE_TABLET | ORAL | Status: AC
  Administered 2024-03-22 (×2): 40 meq via ORAL
  Filled 2024-03-22: qty 2
  Filled 2024-03-22: qty 4

## 2024-03-22 MED ORDER — MAGNESIUM SULFATE IN D5W 1-5 GM/100ML-% IV SOLN
1.0000 g | Freq: Once | INTRAVENOUS | Status: AC
  Administered 2024-03-22: 1 g via INTRAVENOUS
  Filled 2024-03-22: qty 100

## 2024-03-22 MED ORDER — CARVEDILOL 3.125 MG PO TABS
3.1250 mg | ORAL_TABLET | Freq: Two times a day (BID) | ORAL | Status: DC
  Administered 2024-03-22 – 2024-03-30 (×16): 3.125 mg via ORAL
  Filled 2024-03-22 (×16): qty 1

## 2024-03-22 MED ORDER — ENSURE PLUS HIGH PROTEIN PO LIQD
237.0000 mL | Freq: Two times a day (BID) | ORAL | Status: DC
  Administered 2024-03-22 – 2024-03-29 (×15): 237 mL via ORAL
  Filled 2024-03-22: qty 237

## 2024-03-22 MED ORDER — METHOCARBAMOL 500 MG PO TABS
1000.0000 mg | ORAL_TABLET | Freq: Four times a day (QID) | ORAL | Status: DC
  Administered 2024-03-22 – 2024-03-27 (×21): 1000 mg via ORAL
  Filled 2024-03-22 (×21): qty 2

## 2024-03-22 MED ORDER — SPIRONOLACTONE 25 MG PO TABS
25.0000 mg | ORAL_TABLET | Freq: Every day | ORAL | Status: DC
  Administered 2024-03-22 – 2024-03-30 (×9): 25 mg via ORAL
  Filled 2024-03-22 (×9): qty 1

## 2024-03-22 MED ORDER — HYDROMORPHONE HCL 1 MG/ML IJ SOLN: 0.5000 mg | INTRAMUSCULAR | Status: DC | PRN

## 2024-03-22 MED ORDER — PROCHLORPERAZINE EDISYLATE 10 MG/2ML IJ SOLN
5.0000 mg | Freq: Once | INTRAMUSCULAR | Status: AC
  Administered 2024-03-22: 5 mg via INTRAVENOUS
  Filled 2024-03-22: qty 2

## 2024-03-22 MED ORDER — OXYCODONE HCL 5 MG PO TABS
5.0000 mg | ORAL_TABLET | ORAL | Status: DC | PRN
  Administered 2024-03-22: 10 mg via ORAL
  Administered 2024-03-22 – 2024-03-23 (×4): 5 mg via ORAL
  Administered 2024-03-24: 10 mg via ORAL
  Administered 2024-03-24 – 2024-03-27 (×7): 5 mg via ORAL
  Filled 2024-03-22 (×3): qty 1
  Filled 2024-03-22: qty 2
  Filled 2024-03-22 (×3): qty 1
  Filled 2024-03-22: qty 2
  Filled 2024-03-22 (×5): qty 1

## 2024-03-22 NOTE — Progress Notes (Signed)
 Progress Note  5 Days Post-Op  Subjective: Patient reports abdominal soreness after walking more yesterday. Tolerating CLD and passing more flatus. Did have some nausea but also feels hungry this AM. No BM.    Objective: Vital signs in last 24 hours: Temp:  [97.5 F (36.4 C)-98.4 F (36.9 C)] 97.6 F (36.4 C) (11/25 0800) Pulse Rate:  [54-67] 67 (11/25 0800) Resp:  [15-17] 17 (11/25 0800) BP: (143-209)/(64-80) 159/71 (11/25 0800) SpO2:  [96 %-97 %] 96 % (11/25 0800) Last BM Date :  (PTA)  Intake/Output from previous day: 11/24 0701 - 11/25 0700 In: 250.9 [IV Piggyback:250.9] Out: -  Intake/Output this shift: No intake/output data recorded.  PE: General: Pleasant female who is laying in bed in NAD. Heart: HR normal during encounter.  Lungs: Respiratory effort nonlabored. Abd: Soft. Mild distention. Appropriately tender, Abdominal binder present. Midline incision present with staples that is C/D/I. Honeycomb dressing covering. Right lower abdominal incision with staples that is C/D/I covered with additional honeycomb dressing. Psych: A&Ox3 with an appropriate affect.    Lab Results:  Recent Labs    03/20/24 0329 03/21/24 0326  WBC 7.0 5.9  HGB 12.5 11.2*  HCT 39.5 35.6*  PLT 255 267   BMET Recent Labs    03/21/24 0326 03/22/24 0307  NA 140 142  K 3.0* 3.3*  CL 108 112*  CO2 21* 22  GLUCOSE 100* 185*  BUN 19 19  CREATININE 0.81 0.87  CALCIUM 8.3* 8.6*   PT/INR No results for input(s): LABPROT, INR in the last 72 hours. CMP     Component Value Date/Time   NA 142 03/22/2024 0307   K 3.3 (L) 03/22/2024 0307   CL 112 (H) 03/22/2024 0307   CO2 22 03/22/2024 0307   GLUCOSE 185 (H) 03/22/2024 0307   BUN 19 03/22/2024 0307   CREATININE 0.87 03/22/2024 0307   CREATININE 0.91 11/06/2022 0148   CALCIUM 8.6 (L) 03/22/2024 0307   PROT 5.8 (L) 03/19/2024 0421   ALBUMIN  2.5 (L) 03/19/2024 0421   AST 24 03/19/2024 0421   ALT 25 03/19/2024 0421    ALKPHOS 77 03/19/2024 0421   BILITOT 1.8 (H) 03/19/2024 0421   GFRNONAA >60 03/22/2024 0307   GFRAA 58 (L) 10/18/2019 1255   Lipase     Component Value Date/Time   LIPASE 34 03/16/2024 2215       Studies/Results: DG Abd 1 View Result Date: 03/22/2024 EXAM: 1 VIEW XRAY OF THE ABDOMEN 03/22/2024 06:43:28 AM COMPARISON: KUB dated 07/13/2006. CLINICAL HISTORY: 822942 Nausea \\T \ vomiting [id number] Nausea \\T \ vomiting FINDINGS: BOWEL: Mild diffuse gaseous distention of the bowel. Mild formed fecal material within the right colon. There is no evidence of obstruction. SOFT TISSUES: The patient is status post recent laparotomy and bolus. Moses sutures are noted within the left mid abdomen. Several surgical clips are noted in the right lower quadrant. No opaque urinary calculi. BONES: No acute osseous abnormality. IMPRESSION: 1. No bowel obstruction. 2. Status post recent laparotomy with surgical clips in the right lower quadrant and sutures in the left mid abdomen. Electronically signed by: Evalene Coho MD 03/22/2024 06:54 AM EST RP Workstation: [id number]     Anti-infectives: Anti-infectives (From admission, onward)    Start     Dose/Rate Route Frequency Ordered Stop   03/17/24 0600  piperacillin -tazobactam (ZOSYN ) IVPB 3.375 g  Status:  Discontinued        3.375 g 12.5 mL/hr over 240 Minutes Intravenous Every 8 hours 03/17/24 0018 03/22/24 0949  03/17/24 0030  piperacillin -tazobactam (ZOSYN ) IVPB 3.375 g        3.375 g 100 mL/hr over 30 Minutes Intravenous  Once 03/17/24 0018 03/17/24 0134        Assessment/Plan POD5: LAPAROTOMY, EXPLORATORY, SMALL BOWEL RESECTION, WOUND EXPLORATION (N/A) - Repair of incarcerated Incisional hernia by Dr. Richerd Silversmith on 11/20 - Hypertensive. Afebrile, WBC 5. Hgb stable at 11.2 - advance to FLD, add ensure  - encouraged ambulation  - Multi-modal pain control: schedule tylenol , increase robaxin  to 1,000 mg QID, increase oxy to 5-10 mg q 4h PRN.   - Keep K >4.0, Mg >2.0 and Phos >3.0 to optimize bowel function    FEN: FLD, SLIV VTE: LMWH ID: Zosyn  11/20>11/25    LOS: 5 days    Burnard JONELLE Louder, [facility] 03/22/2024, 9:49 AM Please see Amion for pager number during day hours 7:00am-4:30pm

## 2024-03-22 NOTE — Plan of Care (Signed)

## 2024-03-22 NOTE — Progress Notes (Addendum)
 Occupational Therapy Treatment Patient Details Name: Denise Hudson MRN: [id number] DOB: [birth year] Today's Date: 03/22/2024   History of present illness 63 yo female presents to [facility] 11/19 with R abdominal pain, n/v. CT shows incisional hernia with inflamed mesenteric fat, fluid and short segment of thickened small bowel with narrowing as it enters hernia. S/p open incisional hernia repair, ex lap, small bowel resection 11/21. 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   OT comments  Patient having no pain, she is up and moving in the room ad lib with no assist and no AD.  Patient educated on log roll, and figure four for ADL, no assist for donning socks, walked to the sink for grooming task, toileted independently, and agreed to a walk in the halls.  Patient walked the unit with no AD or assist.  No further OT needs in the acute setting.  Patient will had needed iADL/meal support from her family at home.  No post acute OT indicated.  Defer to PT and Mobility for continued activity while in the acute setting.        If plan is discharge home, recommend the following:  Assist for transportation;Assistance with cooking/housework   Equipment Recommendations  None recommended by OT    Recommendations for Other Services      Precautions / Restrictions Precautions Precautions: Other (comment) Recall of Precautions/Restrictions: Intact Precaution/Restrictions Comments: abdominal - log roll in and out of bed; periods of tachycardia up to 170's that quickly resolves to 80s bpm Restrictions Weight Bearing Restrictions Per Provider Order: No       Mobility Bed Mobility Overal bed mobility: Modified Independent                  Transfers Overall transfer level: Independent                 General  transfer comment: patient with no pain this session, moving at her baseline     Balance Overall balance assessment: No apparent balance deficits (not formally assessed)                                         ADL either performed or assessed with clinical judgement   ADL Overall ADL's : At baseline                                       General ADL Comments: with pain managed, patient needs no physical assist with any aspect of ADL/toileting without AD    Extremity/Trunk Assessment Upper Extremity Assessment Upper Extremity Assessment: Overall WFL for tasks assessed   Lower Extremity Assessment Lower Extremity Assessment: Defer to PT evaluation   Cervical / Trunk Assessment Cervical / Trunk Assessment: Other exceptions Cervical / Trunk Exceptions: abd surgery    Vision Patient Visual Report: No change from baseline     Perception Perception Perception: Not tested   Praxis Praxis Praxis: Not tested   Communication Communication Communication: No apparent difficulties   Cognition Arousal: Alert Behavior During Therapy: WFL for tasks assessed/performed Cognition: No apparent impairments  Following commands: Intact        Cueing   Cueing Techniques: Verbal cues  Exercises      Shoulder Instructions       General Comments  HR to 127    Pertinent Vitals/ Pain       Pain Assessment Pain Assessment: No/denies pain Pain Intervention(s): Monitored during session                                                          Frequency           Progress Toward Goals  OT Goals(current goals can now be found in the care plan section)  Progress towards OT goals: Goals met/education completed, patient discharged from OT  Acute Rehab OT Goals OT Goal Formulation: With patient Time For Goal Achievement: 03/25/24 Potential to Achieve Goals: Good  Plan       Co-evaluation                 AM-PAC OT 6 Clicks Daily Activity     Outcome Measure   Help from another person eating meals?: None Help from another person taking care of personal grooming?: None Help from another person toileting, which includes using toliet, bedpan, or urinal?: None Help from another person bathing (including washing, rinsing, drying)?: None Help from another person to put on and taking off regular upper body clothing?: None Help from another person to put on and taking off regular lower body clothing?: None 6 Click Score: 24    End of Session    OT Visit Diagnosis: Unsteadiness on feet (R26.81)   Activity Tolerance Patient tolerated treatment well   Patient Left in chair;with call bell/phone within reach   Nurse Communication Mobility status        Time: 8541-8480 OT Time Calculation (min): 21 min  Charges: OT General Charges $OT Visit: 1 Visit OT Treatments $Self Care/Home Management : 8-22 mins  03/22/2024  RP, OTR/L  Acute Rehabilitation Services  Office:  [phone]   Denise Hudson 03/22/2024, 3:23 PM

## 2024-03-22 NOTE — Progress Notes (Signed)
 PROGRESS NOTE        PATIENT DETAILS Name: Denise Hudson Age: 63 y.o. Sex: female Date of Birth: 1960-12-15 Admit Date: 03/16/2024 Admitting Physician Micaela Speaker, MD PCP:Pcp, No  Brief Summary: Patient is a 63 y.o.  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  Significant events: 11/20>> admit to [facility]  Significant studies: 11/19>> CT abdomen/pelvis: SBO-transition zone seen as a loop of small bowel enters lateral  ventral hernia. 11/20>> echo: EF 60-65%  Significant microbiology data: 11/20>> blood culture: No growth  Procedures: 11/20>> expiratory laparotomy with small bowel resection.  Consults: General Surgery Cards  Subjective: Passed flatus last night.  No BM yet.  Tolerating clear liquids.  Pain is much better controlled.  Objective: Vitals: Blood pressure (!) 159/71, pulse 67, temperature 97.6 F (36.4 C), temperature source Oral, resp. rate 17, height 6' 1 (1.854 m), weight 67 kg, last menstrual period 09/23/2015, SpO2 96%.   Exam: Awake/alert Abdomen: Soft-appropriately tender at the operative site Nonfocal exam No leg edema.  Pertinent Labs/Radiology:    Latest Ref Rng & Units 03/21/2024    3:26 AM 03/20/2024    3:29 AM 03/19/2024    4:21 AM  CBC  WBC 4.0 - 10.5 K/uL 5.9  7.0  7.4   Hemoglobin 12.0 - 15.0 g/dL 88.7  87.4  88.2   Hematocrit 36.0 - 46.0 % 35.6  39.5  37.9   Platelets 150 - 400 K/uL 267  255  231     Lab Results  Component Value Date   NA 142 03/22/2024   K 3.3 (L) 03/22/2024   CL 112 (H) 03/22/2024   CO2 22 03/22/2024     Assessment/Plan: SBO secondary to incarcerated incisional hernia S/p exploratory laparotomy/bowel resection by  general surgery on 11/20 Slowly improving NG tube/Foley discontinued 11/22 Improving-passed flatus last night-tolerating clear liquids-being advanced to full liquids today.  General surgery following-Will defer further postop care to general surgery service.  Paroxysmal supraventricular tachycardia Seen on initial presentation in the ED-required 2 doses of adenosine  with restoration of sinus rhythm Continues to have intermittent episodes of SVT-initially thought to be driven by pain/uncontrolled hypertension but continues in spite of overall clinical improvement continue to have episodes of PSVT. Metoprolol  dosage had to be decreased to 12.5 mg twice daily-as heart rate yesterday was in the 40s. Will get cardiology opinion-May need a short course of amiodarone. Recent TSH/echo stable.  Hypertensive urgency Blood pressure is been better controlled-was significantly elevated over the weekend with SBP> 220. Continue amlodipine /losartan /hydralazine  and low-dose beta-blocker-Will monitor and adjust accordingly.    AKI Mild Likely hemodynamically mediated Has resolved with supportive care.  Hypokalemia Continues to have mild hypokalemia-replete/recheck.  Code status:   Code Status: Full Code   DVT Prophylaxis: enoxaparin  (LOVENOX ) injection 40 mg Start: 03/18/24 1630 SCDs Start: 03/17/24 0023 Place TED hose Start: 03/17/24 0023   Family Communication: None at bedside   Disposition Plan: Status is: Inpatient Remains inpatient appropriate because: Severity of illness   Planned Discharge Destination:Home   Diet: Diet Order             Diet full liquid Room service appropriate? Yes; Fluid consistency: Thin  Diet effective now                     Antimicrobial agents: Anti-infectives (From admission, onward)    Start     Dose/Rate Route Frequency Ordered Stop   03/17/24 0600  piperacillin -tazobactam (ZOSYN ) IVPB 3.375 g  Status:  Discontinued        3.375 g 12.5  mL/hr over 240 Minutes Intravenous Every 8 hours 03/17/24 0018 03/22/24 0949   03/17/24 0030  piperacillin -tazobactam (ZOSYN ) IVPB 3.375 g        3.375 g 100 mL/hr over 30 Minutes Intravenous  Once 03/17/24 0018 03/17/24 0134        MEDICATIONS: Scheduled Meds:  acetaminophen   1,000 mg Oral Q6H   Or   acetaminophen   650 mg Rectal Q6H   amLODipine   10 mg Oral Daily   Chlorhexidine  Gluconate Cloth  6 each Topical Daily   enoxaparin  (LOVENOX ) injection  40 mg Subcutaneous Q24H   feeding supplement  1 Container Oral TID BM   feeding supplement  237 mL Oral BID BM   hydrALAZINE   50 mg Oral Q8H   losartan   100 mg Oral Daily   methocarbamol   1,000 mg Oral QID   metoprolol  tartrate  12.5 mg Oral BID   pantoprazole  (PROTONIX ) IV  40 mg Intravenous Q12H   sodium chloride  flush  3 mL Intravenous Q12H   Continuous Infusions:   PRN Meds:.ALPRAZolam , cloNIDine , hydrALAZINE , HYDROmorphone  (DILAUDID ) injection, labetalol , ondansetron  **OR** ondansetron  (ZOFRAN ) IV, oxyCODONE , phenol, sodium chloride  flush   I have personally reviewed following labs and imaging studies  LABORATORY DATA: CBC: Recent Labs  Lab 03/16/24 2215 03/16/24 2222 03/17/24 0050 03/19/24 0421 03/20/24 0329 03/21/24 0326  WBC 7.3  --  5.9 7.4 7.0 5.9  HGB 15.2* 18.0* 13.7 11.7* 12.5 11.2*  HCT 48.2* 53.0* 43.9 37.9 39.5 35.6*  MCV 79.0*  --  80.0 79.8* 78.2* 77.6*  PLT 245  --  231 231 255 267    Basic Metabolic Panel: Recent Labs  Lab 03/17/24 0050 03/19/24 0421 03/20/24 0329 03/21/24 0326 03/22/24 0307  NA 137 144 144 140 142  K 3.4* 3.9 3.3* 3.0* 3.3*  CL 101 109 110 108 112*  CO2 23 24 22  21* 22  GLUCOSE 113* 91 100* 100* 185*  BUN 45* 21 22 19 19   CREATININE 1.18* 0.93 0.81 0.81 0.87  CALCIUM 8.4* 8.5* 8.7* 8.3* 8.6*  MG  --  2.1  --  1.8 1.9  PHOS  --  2.5  --   --   --     GFR: Estimated Creatinine Clearance: 71.8 mL/min (by C-G formula based on SCr of 0.87 mg/dL).  Liver Function  Tests: Recent Labs  Lab 03/16/24 2215 03/17/24 0050 03/19/24 0421  AST 60* 50* 24  ALT 52* 44 25  ALKPHOS 119 103 77  BILITOT 2.1* 1.7* 1.8*  PROT 7.9 7.0 5.8*  ALBUMIN  3.8 3.2* 2.5*   Recent Labs  Lab 03/16/24 2215  LIPASE 34   No results for input(s): AMMONIA in the last 168 hours.  Coagulation Profile: No results for input(s): INR, PROTIME in the last 168 hours.  Cardiac Enzymes: No results for input(s): CKTOTAL, CKMB, CKMBINDEX, TROPONINI in the last 168 hours.  BNP (last 3 results) No results for input(s): PROBNP in the last 8760 hours.  Lipid Profile: No results for input(s): CHOL, HDL, LDLCALC, TRIG, CHOLHDL, LDLDIRECT in the last 72 hours.  Thyroid Function Tests: No results for input(s): TSH,  T4TOTAL, FREET4, T3FREE, THYROIDAB in the last 72 hours.   Anemia Panel: No results for input(s): VITAMINB12, FOLATE, FERRITIN, TIBC, IRON, RETICCTPCT in the last 72 hours.  Urine analysis:    Component Value Date/Time   COLORURINE YELLOW 03/16/2024 2359   APPEARANCEUR HAZY (A) 03/16/2024 2359   LABSPEC 1.040 (H) 03/16/2024 2359   PHURINE 6.0 03/16/2024 2359   GLUCOSEU NEGATIVE 03/16/2024 2359   HGBUR SMALL (A) 03/16/2024 2359   BILIRUBINUR NEGATIVE 03/16/2024 2359   KETONESUR NEGATIVE 03/16/2024 2359   PROTEINUR 30 (A) 03/16/2024 2359   UROBILINOGEN 1.0 01/02/2015 0043   NITRITE NEGATIVE 03/16/2024 2359   LEUKOCYTESUR LARGE (A) 03/16/2024 2359    Sepsis Labs: Lactic Acid, Venous    Component Value Date/Time   LATICACIDVEN 2.1 (HH) 03/17/2024 0026    MICROBIOLOGY: Recent Results (from the past 240 hours)  Culture, blood (Routine X 2) w Reflex to ID Panel     Status: None   Collection Time: 03/17/24 12:40 AM   Specimen: BLOOD RIGHT ARM  Result Value Ref Range Status   Specimen Description BLOOD RIGHT ARM  Final   Special Requests   Final    BOTTLES DRAWN AEROBIC AND ANAEROBIC Blood Culture adequate  volume   Culture   Final    NO GROWTH 5 DAYS Performed at [facility], [address], [place], [place] [address]    Report Status 03/22/2024 FINAL  Final  Culture, blood (Routine X 2) w Reflex to ID Panel     Status: None   Collection Time: 03/17/24 12:50 AM   Specimen: BLOOD  Result Value Ref Range Status   Specimen Description BLOOD LEFT ANTECUBITAL  Final   Special Requests   Final    BOTTLES DRAWN AEROBIC AND ANAEROBIC Blood Culture adequate volume   Culture   Final    NO GROWTH 5 DAYS Performed at [facility], [address] [place], [place] [address]    Report Status 03/22/2024 FINAL  Final    RADIOLOGY STUDIES/RESULTS: DG Abd 1 View Result Date: 03/22/2024 EXAM: 1 VIEW XRAY OF THE ABDOMEN 03/22/2024 06:43:28 AM COMPARISON: KUB dated 07/13/2006. CLINICAL HISTORY: 822942 Nausea \\T \ vomiting [id number] Nausea \\T \ vomiting FINDINGS: BOWEL: Mild diffuse gaseous distention of the bowel. Mild formed fecal material within the right colon. There is no evidence of obstruction. SOFT TISSUES: The patient is status post recent laparotomy and bolus. Moses sutures are noted within the left mid abdomen. Several surgical clips are noted in the right lower quadrant. No opaque urinary calculi. BONES: No acute osseous abnormality. IMPRESSION: 1. No bowel obstruction. 2. Status post recent laparotomy with surgical clips in the right lower quadrant and sutures in the left mid abdomen. Electronically signed by: Evalene Coho MD 03/22/2024 06:54 AM EST RP Workstation: [id number]      LOS: 5 days   Donalda Applebaum, MD  [facility]    To contact the attending provider between 7A-7P or the covering provider during after hours 7P-7A, please log into the web site [email or web address] and access using universal [place] password for that web site. If you do not have the password, please call the hospital operator.  03/22/2024, 11:34 AM

## 2024-03-22 NOTE — Discharge Instructions (Addendum)
 [facility]      [facility], [place] [phone]  OPEN ABDOMINAL SURGERY: POST OP INSTRUCTIONS  Always review your discharge instruction sheet given to you by the facility where your surgery was performed.  IF YOU HAVE DISABILITY OR FAMILY LEAVE FORMS, YOU MUST BRING THEM TO THE OFFICE FOR PROCESSING.  PLEASE DO NOT GIVE THEM TO YOUR DOCTOR.  A prescription for pain medication may be given to you upon discharge.  Take your pain medication as prescribed, if needed.  If narcotic pain medicine is not needed, then you may take acetaminophen  (Tylenol ) or ibuprofen  (Advil ) as needed. Take your usually prescribed medications unless otherwise directed. If you need a refill on your pain medication, please contact your pharmacy. They will contact our office to request authorization.  Prescriptions will not be filled after 5pm or on week-ends. You should follow a light diet the first few days after arrival home, such as soup and crackers, pudding, etc.unless your doctor has advised otherwise. A high-fiber, low fat diet can be resumed as tolerated.   Be sure to include lots of fluids daily. Most patients will experience some swelling and bruising on the chest and neck area.  Ice packs will help.  Swelling and bruising can take several days to resolve Most patients will experience some swelling and bruising in the area of the incision. Ice pack will help. Swelling and bruising can take several days to resolve..  It is common to experience some constipation if taking pain medication after surgery.  Increasing fluid intake and taking a stool softener will usually help or prevent this problem from occurring.  A mild laxative (Milk of Magnesia or Miralax ) should be taken according to package directions if there are no bowel movements after 48 hours.  You may have steri-strips (small skin tapes) in place directly over the incision.  These strips should be left on the skin for 7-10 days.  If your surgeon used skin  glue on the incision, you may shower in 24 hours.  The glue will flake off over the next 2-3 weeks.  Any sutures or staples will be removed at the office during your follow-up visit. You may find that a light gauze bandage over your incision may keep your staples from being rubbed or pulled. You may shower and replace the bandage daily. ACTIVITIES:  You may resume regular (light) daily activities beginning the next day--such as daily self-care, walking, climbing stairs--gradually increasing activities as tolerated.  You may have sexual intercourse when it is comfortable.  Refrain from any heavy lifting or straining until approved by your doctor. You may drive when you no longer are taking prescription pain medication, you can comfortably wear a seatbelt, and you can safely maneuver your car and apply brakes  You should see your doctor in the office for a follow-up appointment approximately two weeks after your surgery.  Make sure that you call for this appointment within a day or two after you arrive home to insure a convenient appointment time.   WHEN TO CALL YOUR DOCTOR: Fever over 101.0 Inability to urinate Nausea and/or vomiting Extreme swelling or bruising Continued bleeding from incision. Increased pain, redness, or drainage from the incision. Difficulty swallowing or breathing Muscle cramping or spasms. Numbness or tingling in hands or feet or around lips.  The clinic staff is available to answer your questions during regular business hours.  Please don't hesitate to call and ask to speak to one of the nurses if you have concerns.  For further questions, please visit [email or web address]   Follow with Primary MD  in 7 days   Get CBC, CMP, Magnesium , 2 view Chest X ray -  checked next visit with your primary MD  Activity: As tolerated with Full fall precautions use walker/cane & assistance as needed  Disposition Home     Diet: Heart Healthy    Special Instructions: If  you have smoked or chewed Tobacco  in the last 2 yrs please stop smoking, stop any regular Alcohol  and or any Recreational drug use.  On your next visit with your primary care physician please Get Medicines reviewed and adjusted.  Please request your Prim.MD to go over all Hospital Tests and Procedure/Radiological results at the follow up, please get all Hospital records sent to your Prim MD by signing hospital release before you go home.  If you experience worsening of your admission symptoms, develop shortness of breath, life threatening emergency, suicidal or homicidal thoughts you must seek medical attention immediately by calling 911 or calling your MD immediately  if symptoms less severe.  You Must read complete instructions/literature along with all the possible adverse reactions/side effects for all the Medicines you take and that have been prescribed to you. Take any new Medicines after you have completely understood and accpet all the possible adverse reactions/side effects.   Do not drive when taking Pain medications.  Do not take more than prescribed Pain, Sleep and Anxiety Medications  Wear Seat belts while driving.

## 2024-03-22 NOTE — Consult Note (Signed)
 Cardiology Consultation   Patient ID: LEXINGTON DEVINE MRN: [id number]; DOB: Jan 30, 1961  Admit date: 03/16/2024 Date of Consult: 03/22/2024  PCP:  Freddrick Johns   [facility] Providers Cardiologist:  Shelda Bruckner, MD       Patient Profile: Denise Hudson is a 63 y.o. female with a hx of hypertension, pyelonephritis secondary to obstructing kidney stone, iron deficiency anemia, malnutrition, and vertebral osteomyelitis who is being seen 03/22/2024 for the evaluation of SVT and bradycardia at the request of Shaker Ghimire MD.  History of Present Illness: Ms. Schermer is a 63 year old female who per chart review has not previously been seen by cardiology.  Patient presented to the emergency department for abdominal pain.  She was diagnosed with SBO secondary to incarcerated incisional hernia.  Surgery did an exploratory laparotomy on 03/17/2024.  She has improved and is currently on a full liquid diet. In the emergency department the patient was also in SVT in the 180s.  She returned to sinus rhythm after receiving adenosine  x 2.  The patient was started on metoprolol  but this had to be decreased due to concerns of bradycardia.  On interview patient reported that she has a fluttering sensation when she goes into SVT.  Denied having any symptoms of this in the past prior to her SBO and this admission.  Patient denied having any of this fluttering sensation today. Reported that she works at [employer] and is active on her feet while she is at work. Denies chest pain, shortness of breath, orthopnea, lower extremity edema, fever, or chills. Denied alcohol use, nicotine use, cannabis use, or illicit substance use.  Labs showed high-sensitivity troponin 88 > 95, TSH within normal limits, magnesium  normal at 1.9, hypokalemia of 3.3 (received potassium replacement), hyperglycemia of 185, creatinine of 0.87, hypocalcemia of 8.6, hypoalbuminemia of 2.5, WBC count of 5.9, microcytic anemia with a  hemoglobin of 11.2  EKG showed sinus tachycardia with a rate of 100, LVH, and a PVC.  Echocardiogram showed a normal LVEF of 60 to 65%, mild concentric LVH, G1 DD, normal RV systolic function, trivial MR, moderate MS, and mild aortic valve calcification.  Chest x-ray found no acute findings.  Past Medical History:  Diagnosis Date   Constipation    History of acute pyelonephritis 07/12/2022   secondary to uropathy obstruction due to kidney stones   History of kidney stones    Microcytic anemia 07/12/2022   Nephrolithiasis    left side calculi non-obstructive per CT 07-18-2022   Ureteral calculi 07/12/2022   right side   Wears contact lenses     Past Surgical History:  Procedure Laterality Date   APPENDECTOMY  age 43   CYSTOSCOPY WITH STENT PLACEMENT Right 02/10/2017   Procedure: CYSTOSCOPY WITH RIGHT STENT PLACEMENT;  Surgeon: Devere Bruckner Righter, MD;  Location: [facility];  Service: Urology;  Laterality: Right;   CYSTOSCOPY/URETEROSCOPY/HOLMIUM LASER/STENT PLACEMENT Right 03/04/2017   Procedure: CYSTOSCOPY/URETEROSCOPY/HOLMIUM LASER/STENT PLACEMENT;  Surgeon: Devere Bruckner Righter, MD;  Location: [facility];  Service: Urology;  Laterality: Right;  NEEDS 30 MIN FOR PROCEDURE   CYSTOSCOPY/URETEROSCOPY/HOLMIUM LASER/STENT PLACEMENT Right 07/13/2022   Procedure: CYSTOSCOPY RIGHT URETERAL STENT PLACEMENT;  Surgeon: Carolee Sherwood JONETTA DOUGLAS, MD;  Location: [facility];  Service: Urology;  Laterality: Right;   CYSTOSCOPY/URETEROSCOPY/HOLMIUM LASER/STENT PLACEMENT Right 07/23/2022   Procedure: CYSTOSCOPY RIGHT URETEROSCOPY, HOLMIUM LASER LITHOTRIPSY, AND RIGHT URETERAL STENT PLACEMENT;  Surgeon: Devere Bruckner Righter, MD;  Location: [facility];  Service: Urology;  Laterality: Right;  90  MINUTES   EXTRACORPOREAL SHOCK WAVE LITHOTRIPSY  2008   IR LUMBAR DISC ASPIRATION W/IMG GUIDE  08/12/2022   LAPAROTOMY N/A 03/17/2024   Procedure: LAPAROTOMY, EXPLORATORY SMALL  BOWEL RESECTION WOUND EXPLORATION;  Surgeon: Ann Fine, MD;  Location: [facility];  Service: General;  Laterality: N/A;  Repair of incarcerated Incisional hernia     Home Medications:  Prior to Admission medications   Not on File    Scheduled Meds:  acetaminophen   1,000 mg Oral Q6H   Or   acetaminophen   650 mg Rectal Q6H   amLODipine   10 mg Oral Daily   Chlorhexidine  Gluconate Cloth  6 each Topical Daily   enoxaparin  (LOVENOX ) injection  40 mg Subcutaneous Q24H   feeding supplement  1 Container Oral TID BM   feeding supplement  237 mL Oral BID BM   hydrALAZINE   50 mg Oral Q8H   losartan   100 mg Oral Daily   methocarbamol   1,000 mg Oral QID   metoprolol  tartrate  12.5 mg Oral BID   pantoprazole  (PROTONIX ) IV  40 mg Intravenous Q12H   potassium chloride   40 mEq Oral Q4H   sodium chloride  flush  3 mL Intravenous Q12H   Continuous Infusions:  PRN Meds: ALPRAZolam , cloNIDine , hydrALAZINE , HYDROmorphone  (DILAUDID ) injection, labetalol , ondansetron  **OR** ondansetron  (ZOFRAN ) IV, oxyCODONE , phenol, sodium chloride  flush  Allergies:   No Known Allergies  Social History:   Social History   Socioeconomic History   Marital status: Divorced    Spouse name: Not on file   Number of children: Not on file   Years of education: Not on file   Highest education level: Not on file  Occupational History   Not on file  Tobacco Use   Smoking status: Former    Current packs/day: 0.00    Types: Cigarettes    Start date: 02/25/1994    Quit date: 02/25/2014    Years since quitting: 10.0   Smokeless tobacco: Never  Vaping Use   Vaping status: Never Used  Substance and Sexual Activity   Alcohol use: No   Drug use: No   Sexual activity: Not on file  Other Topics Concern   Not on file  Social History Narrative   Not on file   Social Drivers of Health   Financial Resource Strain: Not on file  Food Insecurity: No Food Insecurity (03/17/2024)   Hunger Vital Sign    Worried About  Running Out of Food in the Last Year: Never true    Ran Out of Food in the Last Year: Never true  Transportation Needs: No Transportation Needs (03/17/2024)   PRAPARE - Administrator, Civil Service (Medical): No    Lack of Transportation (Non-Medical): No  Physical Activity: Not on file  Stress: Not on file  Social Connections: Not on file  Intimate Partner Violence: Not At Risk (03/17/2024)   Humiliation, Afraid, Rape, and Kick questionnaire    Fear of Current or Ex-Partner: No    Emotionally Abused: No    Physically Abused: No    Sexually Abused: No    Family History:   History reviewed. No pertinent family history.   ROS:  Please see the history of present illness.   All other ROS reviewed and negative.     Physical Exam/Data: Vitals:   03/22/24 0641 03/22/24 0800 03/22/24 1247 03/22/24 1248  BP: (!) 209/79 (!) 159/71 (!) 155/74 (!) 155/74  Pulse:  67  67  Resp:  17  17  Temp:  97.6 F (36.4 C)  98.2 F (36.8 C)  TempSrc:  Oral    SpO2:  96%  93%  Weight:      Height:        Intake/Output Summary (Last 24 hours) at 03/22/2024 1421 Last data filed at 03/21/2024 1800 Gross per 24 hour  Intake 250.88 ml  Output --  Net 250.88 ml      03/16/2024   10:03 PM 12/11/2022    1:35 PM 11/06/2022    1:35 PM  Last 3 Weights  Weight (lbs) 147 lb 11.3 oz 148 lb 140 lb  Weight (kg) 67 kg 67.132 kg 63.504 kg     Body mass index is 19.49 kg/m.  General:  Well nourished, well developed, in no acute distress.  Alert and orientated on room air. HEENT: normal Neck: no JVD Vascular: No carotid bruits; Distal pulses 2+ bilaterally Cardiac:  normal S1, S2; RRR; no murmur  Lungs:  clear to auscultation bilaterally, no wheezing, rhonchi or rales  Abd: Abdomen is tender on surgical site. Ext: no edema Musculoskeletal:  No deformities Skin: warm and dry  Neuro:  no focal abnormalities noted Psych:  Normal affect   EKG:  The EKG was personally reviewed and  demonstrates:  EKG showed sinus tachycardia with a rate of 100, LVH, and a PVC. Telemetry:  Telemetry was personally reviewed and demonstrates: Sinus arrhythmia with heart rates in the 50s to 110's.  Has multiple PACs/PJCs.  Runs of SVT were seen yesterday and days prior to that.  None were seen on telemetry today.  Relevant CV Studies: Echo on 03/18/2024 IMPRESSIONS     1. Left ventricular ejection fraction, by estimation, is 60 to 65%. The  left ventricle has normal function. The left ventricle has no regional  wall motion abnormalities. There is mild concentric left ventricular  hypertrophy. Left ventricular diastolic  parameters are consistent with Grade I diastolic dysfunction (impaired  relaxation).   2. Right ventricular systolic function is normal. The right ventricular  size is normal.   3. The mitral valve is normal in structure. Trivial mitral valve  regurgitation. Moderate mitral stenosis.   4. The aortic valve is tricuspid. There is mild calcification of the  aortic valve. Aortic valve regurgitation is not visualized. Aortic valve  sclerosis/calcification is present, without any evidence of aortic  stenosis.   Laboratory Data: High Sensitivity Troponin:   Recent Labs  Lab 03/16/24 2215 03/17/24 0050  TROPONINIHS 88* 95*     Chemistry Recent Labs  Lab 03/19/24 0421 03/20/24 0329 03/21/24 0326 03/22/24 0307  NA 144 144 140 142  K 3.9 3.3* 3.0* 3.3*  CL 109 110 108 112*  CO2 24 22 21* 22  GLUCOSE 91 100* 100* 185*  BUN 21 22 19 19   CREATININE 0.93 0.81 0.81 0.87  CALCIUM 8.5* 8.7* 8.3* 8.6*  MG 2.1  --  1.8 1.9  GFRNONAA >60 >60 >60 >60  ANIONGAP 11 12 11 8     Recent Labs  Lab 03/16/24 2215 03/17/24 0050 03/19/24 0421  PROT 7.9 7.0 5.8*  ALBUMIN  3.8 3.2* 2.5*  AST 60* 50* 24  ALT 52* 44 25  ALKPHOS 119 103 77  BILITOT 2.1* 1.7* 1.8*   Lipids No results for input(s): CHOL, TRIG, HDL, LABVLDL, LDLCALC, CHOLHDL in the last 168 hours.   Hematology Recent Labs  Lab 03/19/24 0421 03/20/24 0329 03/21/24 0326  WBC 7.4 7.0 5.9  RBC 4.75 5.05 4.59  HGB 11.7* 12.5 11.2*  HCT 37.9  39.5 35.6*  MCV 79.8* 78.2* 77.6*  MCH 24.6* 24.8* 24.4*  MCHC 30.9 31.6 31.5  RDW 14.1 14.0 14.1  PLT 231 255 267   Thyroid  Recent Labs  Lab 03/16/24 2310  TSH 3.054    BNPNo results for input(s): BNP, PROBNP in the last 168 hours.  DDimer No results for input(s): DDIMER in the last 168 hours.  Radiology/Studies:  DG Abd 1 View Result Date: 03/22/2024 EXAM: 1 VIEW XRAY OF THE ABDOMEN 03/22/2024 06:43:28 AM COMPARISON: KUB dated 07/13/2006. CLINICAL HISTORY: 822942 Nausea \\T \ vomiting [id number] Nausea \\T \ vomiting FINDINGS: BOWEL: Mild diffuse gaseous distention of the bowel. Mild formed fecal material within the right colon. There is no evidence of obstruction. SOFT TISSUES: The patient is status post recent laparotomy and bolus. Moses sutures are noted within the left mid abdomen. Several surgical clips are noted in the right lower quadrant. No opaque urinary calculi. BONES: No acute osseous abnormality. IMPRESSION: 1. No bowel obstruction. 2. Status post recent laparotomy with surgical clips in the right lower quadrant and sutures in the left mid abdomen. Electronically signed by: Evalene Coho MD 03/22/2024 06:54 AM EST RP Workstation: [id number]     Assessment and Plan:  Denise Hudson is a 63 y.o. female with a hx of hypertension, pyelonephritis secondary to obstructing kidney stone, iron deficiency anemia, malnutrition, and vertebral osteomyelitis who is being seen 03/22/2024 for the evaluation of SVT and bradycardia at the request of Shaker Ghimire MD.  SBO secondary to incarcerated incisional hernia Patient presented to the emergency department for abdominal pain.  She was diagnosed with SBO secondary to incarcerated incisional hernia.  Surgery did an exploratory laparotomy on 03/17/2024.  She has improved and is currently on  a full liquid diet.   SVT likely AT In the setting of SBO secondary to incarcerated incisional hernia as mentioned above. On admission was in SVT in the 180s.  Patient's heart rhythm returned to sinus after receiving adenosine  x 2. The patient was started on metoprolol  but this had to be decreased due to concerns of bradycardia.  She is currently on metoprolol  tartrate 12.5 mg twice daily. Patient reported that she has a fluttering sensation when she goes into SVT.  Denied having any symptoms of this in the past prior to her SBO and this admission.  Also denied having any fluttering sensation today. Continues to have sinus arrhythmia and PACs/PJC's on telemetry no runs of SVT were seen on telemetry today. TSH within normal limits. May consider 14-day ZIO monitor to see if continues to have SVT. Transition to coreg  3.125mg  twice daily for more blood pressure control   Hypertension Has had a poorly controlled blood pressure at outpatient visits in the past. Blood pressures remain elevated in the hospital.  Most recent BP at 12:48 PM today was 155/74. Continue amlodipine  10 mg daily Continue losartan  100 mg daily Continue hydralazine  50 mg every 8 hours Start aldactone  25mg  daily   Elevated high-sensitivity troponins 88 > 95 Denies any chest pain or anginal symptoms.  Secondary to demand ischemia from SVT in the 180s and small bowel obstruction.   Risk Assessment/Risk Scores:              For questions or updates, please contact [facility] Please consult [email or web address] for contact info under     Signed, Morse Clause, PA-C  03/22/2024 2:21 PM

## 2024-03-22 NOTE — Progress Notes (Signed)
   03/22/24 0832  Mobility  Activity Dangled on edge of bed  Level of Assistance Standby assist, set-up cues, supervision of patient - no hands on  Assistive Device None  Activity Response Tolerated fair  Mobility Referral Yes  Mobility visit 1 Mobility  Mobility Specialist Start Time (ACUTE ONLY) [id number]  Mobility Specialist Stop Time (ACUTE ONLY) 0837  Mobility Specialist Time Calculation (min) (ACUTE ONLY) 5 min   Mobility Specialist: Progress Note  Pt agreeable to mobility session - received in bed. C/o abd pain rated 8/10. Returned to bed with all needs met - call bell within reach.   Additional comments: Pt deferred further mobility d/t abd pain.   Virgle Boards, BS Mobility Specialist Please contact via SecureChat or  Rehab office at [phone].

## 2024-03-23 ENCOUNTER — Inpatient Hospital Stay (HOSPITAL_COMMUNITY)

## 2024-03-23 DIAGNOSIS — K56609 Unspecified intestinal obstruction, unspecified as to partial versus complete obstruction: Secondary | ICD-10-CM | POA: Diagnosis not present

## 2024-03-23 DIAGNOSIS — I479 Paroxysmal tachycardia, unspecified: Secondary | ICD-10-CM | POA: Diagnosis not present

## 2024-03-23 DIAGNOSIS — I10 Essential (primary) hypertension: Secondary | ICD-10-CM | POA: Diagnosis not present

## 2024-03-23 DIAGNOSIS — I471 Supraventricular tachycardia, unspecified: Secondary | ICD-10-CM | POA: Diagnosis not present

## 2024-03-23 DIAGNOSIS — I159 Secondary hypertension, unspecified: Secondary | ICD-10-CM | POA: Diagnosis not present

## 2024-03-23 DIAGNOSIS — K460 Unspecified abdominal hernia with obstruction, without gangrene: Secondary | ICD-10-CM | POA: Diagnosis not present

## 2024-03-23 LAB — BASIC METABOLIC PANEL WITH GFR
Anion gap: 6 (ref 5–15)
BUN: 21 mg/dL (ref 8–23)
CO2: 21 mmol/L — ABNORMAL LOW (ref 22–32)
Calcium: 8.8 mg/dL — ABNORMAL LOW (ref 8.9–10.3)
Chloride: 113 mmol/L — ABNORMAL HIGH (ref 98–111)
Creatinine, Ser: 0.82 mg/dL (ref 0.44–1.00)
GFR, Estimated: >60 mL/min (ref >60)
Glucose, Bld: 129 mg/dL — ABNORMAL HIGH (ref 70–99)
Potassium: 4.3 mmol/L (ref 3.5–5.1)
Sodium: 140 mmol/L (ref 135–145)

## 2024-03-23 LAB — CBC
HCT: 34.2 % — ABNORMAL LOW (ref 36.0–46.0)
Hemoglobin: 10.5 g/dL — ABNORMAL LOW (ref 12.0–15.0)
MCH: 24.2 pg — ABNORMAL LOW (ref 26.0–34.0)
MCHC: 30.7 g/dL (ref 30.0–36.0)
MCV: 79 fL — ABNORMAL LOW (ref 80.0–100.0)
Platelets: 288 K/uL (ref 150–400)
RBC: 4.33 MIL/uL (ref 3.87–5.11)
RDW: 15 % (ref 11.5–15.5)
WBC: 5.6 K/uL (ref 4.0–10.5)
nRBC: 0 % (ref 0.0–0.2)

## 2024-03-23 LAB — PHOSPHORUS: Phosphorus: 3.7 mg/dL (ref 2.5–4.6)

## 2024-03-23 LAB — MAGNESIUM: Magnesium: 2 mg/dL (ref 1.7–2.4)

## 2024-03-23 MED ORDER — IRBESARTAN 300 MG PO TABS
300.0000 mg | ORAL_TABLET | Freq: Every day | ORAL | Status: DC
  Administered 2024-03-24 – 2024-03-30 (×7): 300 mg via ORAL
  Filled 2024-03-23 (×7): qty 1

## 2024-03-23 MED ORDER — BISACODYL 10 MG RE SUPP
10.0000 mg | Freq: Once | RECTAL | Status: AC
  Administered 2024-03-23: 10 mg via RECTAL
  Filled 2024-03-23: qty 1

## 2024-03-23 NOTE — Progress Notes (Signed)
 PROGRESS NOTE        PATIENT DETAILS Name: Denise Hudson Age: 63 y.o. Sex: female Date of Birth: [birth year] Admit Date: 03/16/2024 Admitting Physician Micaela Speaker, MD PCP:Pcp, No  Brief Summary: Patient is a 63 y.o.  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  Significant events: 11/20>> admit to [facility]  Significant studies: 11/19>> CT abdomen/pelvis: SBO-transition zone seen as a loop of small bowel enters lateral  ventral hernia. 11/20>> echo: EF 60-65%  Significant microbiology data: 11/20>> blood culture: No growth  Procedures: 11/20>> expiratory laparotomy with small bowel resection.  Consults: General Surgery Cards  Subjective: No yet but passing flatus.  Pain is well-controlled.  Objective: Vitals: Blood pressure (!) 164/70, pulse 78, temperature 98.4 F (36.9 C), temperature source Oral, resp. rate 16, height 6' 1 (1.854 m), weight 67 kg, last menstrual period 09/23/2015, SpO2 97%.   Exam: Awake/alert Abdomen: Soft-appropriately tender at the operative site Nonfocal exam No leg edema.  Pertinent Labs/Radiology:    Latest Ref Rng & Units 03/21/2024    3:26 AM 03/20/2024    3:29 AM 03/19/2024    4:21 AM  CBC  WBC 4.0 - 10.5 K/uL 5.9  7.0  7.4   Hemoglobin 12.0 - 15.0 g/dL 88.7  87.4  88.2   Hematocrit 36.0 - 46.0 % 35.6  39.5  37.9   Platelets 150 - 400 K/uL 267  255  231     Lab Results  Component Value Date   NA 140 03/23/2024   K 4.3 03/23/2024   CL 113 (H) 03/23/2024   CO2 21 (L) 03/23/2024     Assessment/Plan: SBO secondary to incarcerated incisional hernia S/p exploratory laparotomy/bowel resection by general surgery on 11/20 Slowly improving NG  tube/Foley discontinued 11/22 Improving-passed flatus last night-tolerating clear liquids-being advanced to full liquids today.  General surgery following-Will defer further postop care to general surgery service.  Paroxysmal supraventricular tachycardia Continues to have episodes of PSVT On beta-blocker-previous attempts at increasing doses of beta-blocker have resulted in bradycardia in the 40s. Cardiology now following Recent TSH/echo stable.    Hypertensive urgency Blood pressure is been better controlled-was significantly elevated over the weekend with SBP> 220. Continue amlodipine /losartan /hydralazine  and low-dose beta-blocker-Will monitor and adjust accordingly.    AKI Mild Likely hemodynamically mediated Has resolved with supportive care.  Hypokalemia Repleted.  Code status:   Code Status: Full Code   DVT Prophylaxis: enoxaparin  (LOVENOX ) injection 40 mg Start: 03/18/24 1630 SCDs Start: 03/17/24 0023 Place TED hose Start: 03/17/24 0023   Family Communication: None at bedside   Disposition Plan: Status is: Inpatient Remains inpatient appropriate because: Severity of illness   Planned Discharge Destination:Home   Diet: Diet Order             Diet full liquid Room service appropriate? Yes; Fluid consistency: Thin  Diet effective now                     Antimicrobial agents: Anti-infectives (From admission, onward)    Start     Dose/Rate Route Frequency Ordered Stop   03/17/24 0600  piperacillin -tazobactam (ZOSYN ) IVPB 3.375 g  Status:  Discontinued  3.375 g 12.5 mL/hr over 240 Minutes Intravenous Every 8 hours 03/17/24 0018 03/22/24 0949   03/17/24 0030  piperacillin -tazobactam (ZOSYN ) IVPB 3.375 g        3.375 g 100 mL/hr over 30 Minutes Intravenous  Once 03/17/24 0018 03/17/24 0134        MEDICATIONS: Scheduled Meds:  acetaminophen   1,000 mg Oral Q6H   Or   acetaminophen   650 mg Rectal Q6H   amLODipine   10 mg Oral Daily    carvedilol   3.125 mg Oral BID WC   enoxaparin  (LOVENOX ) injection  40 mg Subcutaneous Q24H   feeding supplement  1 Container Oral TID BM   feeding supplement  237 mL Oral BID BM   hydrALAZINE   50 mg Oral Q8H   losartan   100 mg Oral Daily   methocarbamol   1,000 mg Oral QID   pantoprazole  (PROTONIX ) IV  40 mg Intravenous Q12H   sodium chloride  flush  3 mL Intravenous Q12H   spironolactone   25 mg Oral Daily   Continuous Infusions:   PRN Meds:.ALPRAZolam , hydrALAZINE , HYDROmorphone  (DILAUDID ) injection, labetalol , ondansetron  **OR** ondansetron  (ZOFRAN ) IV, oxyCODONE , phenol, sodium chloride  flush   I have personally reviewed following labs and imaging studies  LABORATORY DATA: CBC: Recent Labs  Lab 03/16/24 2215 03/16/24 2222 03/17/24 0050 03/19/24 0421 03/20/24 0329 03/21/24 0326  WBC 7.3  --  5.9 7.4 7.0 5.9  HGB 15.2* 18.0* 13.7 11.7* 12.5 11.2*  HCT 48.2* 53.0* 43.9 37.9 39.5 35.6*  MCV 79.0*  --  80.0 79.8* 78.2* 77.6*  PLT 245  --  231 231 255 267    Basic Metabolic Panel: Recent Labs  Lab 03/19/24 0421 03/20/24 0329 03/21/24 0326 03/22/24 0307 03/23/24 0232  NA 144 144 140 142 140  K 3.9 3.3* 3.0* 3.3* 4.3  CL 109 110 108 112* 113*  CO2 24 22 21* 22 21*  GLUCOSE 91 100* 100* 185* 129*  BUN 21 22 19 19 21   CREATININE 0.93 0.81 0.81 0.87 0.82  CALCIUM 8.5* 8.7* 8.3* 8.6* 8.8*  MG 2.1  --  1.8 1.9 2.0  PHOS 2.5  --   --   --  3.7    GFR: Estimated Creatinine Clearance: 76.2 mL/min (by C-G formula based on SCr of 0.82 mg/dL).  Liver Function Tests: Recent Labs  Lab 03/16/24 2215 03/17/24 0050 03/19/24 0421  AST 60* 50* 24  ALT 52* 44 25  ALKPHOS 119 103 77  BILITOT 2.1* 1.7* 1.8*  PROT 7.9 7.0 5.8*  ALBUMIN  3.8 3.2* 2.5*   Recent Labs  Lab 03/16/24 2215  LIPASE 34   No results for input(s): AMMONIA in the last 168 hours.  Coagulation Profile: No results for input(s): INR, PROTIME in the last 168 hours.  Cardiac Enzymes: No  results for input(s): CKTOTAL, CKMB, CKMBINDEX, TROPONINI in the last 168 hours.  BNP (last 3 results) No results for input(s): PROBNP in the last 8760 hours.  Lipid Profile: No results for input(s): CHOL, HDL, LDLCALC, TRIG, CHOLHDL, LDLDIRECT in the last 72 hours.  Thyroid Function Tests: No results for input(s): TSH, T4TOTAL, FREET4, T3FREE, THYROIDAB in the last 72 hours.   Anemia Panel: No results for input(s): VITAMINB12, FOLATE, FERRITIN, TIBC, IRON, RETICCTPCT in the last 72 hours.  Urine analysis:    Component Value Date/Time   COLORURINE YELLOW 03/16/2024 2359   APPEARANCEUR HAZY (A) 03/16/2024 2359   LABSPEC 1.040 (H) 03/16/2024 2359   PHURINE 6.0 03/16/2024 2359   GLUCOSEU NEGATIVE 03/16/2024 2359  HGBUR SMALL (A) 03/16/2024 2359   BILIRUBINUR NEGATIVE 03/16/2024 2359   KETONESUR NEGATIVE 03/16/2024 2359   PROTEINUR 30 (A) 03/16/2024 2359   UROBILINOGEN 1.0 01/02/2015 0043   NITRITE NEGATIVE 03/16/2024 2359   LEUKOCYTESUR LARGE (A) 03/16/2024 2359    Sepsis Labs: Lactic Acid, Venous    Component Value Date/Time   LATICACIDVEN 2.1 (HH) 03/17/2024 0026    MICROBIOLOGY: Recent Results (from the past 240 hours)  Culture, blood (Routine X 2) w Reflex to ID Panel     Status: None   Collection Time: 03/17/24 12:40 AM   Specimen: BLOOD RIGHT ARM  Result Value Ref Range Status   Specimen Description BLOOD RIGHT ARM  Final   Special Requests   Final    BOTTLES DRAWN AEROBIC AND ANAEROBIC Blood Culture adequate volume   Culture   Final    NO GROWTH 5 DAYS Performed at [facility], [address], [place], [place] [address]    Report Status 03/22/2024 FINAL  Final  Culture, blood (Routine X 2) w Reflex to ID Panel     Status: None   Collection Time: 03/17/24 12:50 AM   Specimen: BLOOD  Result Value Ref Range Status   Specimen Description BLOOD LEFT ANTECUBITAL  Final   Special Requests   Final    BOTTLES  DRAWN AEROBIC AND ANAEROBIC Blood Culture adequate volume   Culture   Final    NO GROWTH 5 DAYS Performed at [facility], [address], [place], [place] [address]    Report Status 03/22/2024 FINAL  Final    RADIOLOGY STUDIES/RESULTS: DG Abd 1 View Result Date: 03/22/2024 EXAM: 1 VIEW XRAY OF THE ABDOMEN 03/22/2024 06:43:28 AM COMPARISON: KUB dated 07/13/2006. CLINICAL HISTORY: 822942 Nausea \\T \ vomiting [id number] Nausea \\T \ vomiting FINDINGS: BOWEL: Mild diffuse gaseous distention of the bowel. Mild formed fecal material within the right colon. There is no evidence of obstruction. SOFT TISSUES: The patient is status post recent laparotomy and bolus. Moses sutures are noted within the left mid abdomen. Several surgical clips are noted in the right lower quadrant. No opaque urinary calculi. BONES: No acute osseous abnormality. IMPRESSION: 1. No bowel obstruction. 2. Status post recent laparotomy with surgical clips in the right lower quadrant and sutures in the left mid abdomen. Electronically signed by: Evalene Coho MD 03/22/2024 06:54 AM EST RP Workstation: [id number]      LOS: 6 days   Donalda Applebaum, MD  [facility]    To contact the attending provider between 7A-7P or the covering provider during after hours 7P-7A, please log into the web site [email or web address] and access using universal [place] password for that web site. If you do not have the password, please call the hospital operator.  03/23/2024, 11:26 AM

## 2024-03-23 NOTE — Plan of Care (Signed)

## 2024-03-23 NOTE — Progress Notes (Signed)
  Progress Note  Patient Name: Denise Hudson Date of Encounter: 03/23/2024 [facility] Cardiologist: Shelda Bruckner, MD   Interval Summary   Reports feeling okay Remains asymptomatic SVT episodes this morning that lasted a few seconds She endured an episode with HR in 150s while I was in the room with her, she remains asymptomatic She reports that she sometimes feels mild palpitations but its usually when she is experiencing abdominal pain Today will be the first BID dosing of carvedilol   Vital Signs Vitals:   03/23/24 0031 03/23/24 0442 03/23/24 0500 03/23/24 0800  BP: (!) 161/61 (!) 150/79 (!) 143/62   Pulse:  (!) 57 (!) 51 61  Resp:  19 11 19   Temp: 98 F (36.7 C) 98.4 F (36.9 C)    TempSrc: Oral Oral    SpO2:  98% 98% 98%  Weight:      Height:       No intake or output data in the 24 hours ending 03/23/24 0903    03/16/2024   10:03 PM 12/11/2022    1:35 PM 11/06/2022    1:35 PM  Last 3 Weights  Weight (lbs) 147 lb 11.3 oz 148 lb 140 lb  Weight (kg) 67 kg 67.132 kg 63.504 kg     Telemetry/ECG  Sinus rhythm, HR 70-80s with frequent ectopy. Episodes of SVT with rates 150-160s 11/26 AM that broke on their own - Personally Reviewed  Physical Exam  GEN: No acute distress.   Neck: No JVD Cardiac: RRR with frequent ectopy, no murmurs, rubs, or gallops.  Respiratory: Clear to auscultation bilaterally. GI: Soft, nontender, non-distended  MS: No edema  Assessment & Plan   Paroxysmal SVT Asymptomatic with episodes of SVT Echo; LVEF 60-65%, no RWMA, G1DD, moderate MS Started on carvedilol  3.125 mg BID, first dose yesterday  Possible 14 day Zio at discharge for SVT burden May require EP referral if not well controlled with beta-blocker  Hypertension Not well controlled on arrival, trending down Presented with BP 209/79, most recent BP 143/62 Renal function stable  Started on carvedilol  3.125 mg BID yesterday -- today will be first BID dosing, can  consider increasing dose if she continues to have SVT once at this dose  Continue amlodipine  10 mg daily Continue hydralazine  50 mg TID  Continue losartan  100 mg daily Continue spironolactone  25 mg daily   Per primary  SBO Incarcerated incisional hernia AKI Electrolyte disturbances  For questions or updates, please contact [facility] Please consult [email or web address] for contact info under   Signed, Waddell [name], PA-C

## 2024-03-23 NOTE — Progress Notes (Signed)
 Progress Note  6 Days Post-Op  Subjective: No nausea today or yesterday evening. Tolerating FLD but quite distended this AM. States she is passing some flatus   Objective: Vital signs in last 24 hours: Temp:  [98 F (36.7 C)-98.4 F (36.9 C)] 98.4 F (36.9 C) (11/26 0442) Pulse Rate:  [51-78] 78 (11/26 0900) Resp:  [11-19] 16 (11/26 0900) BP: (143-175)/(61-81) 164/70 (11/26 0900) SpO2:  [93 %-98 %] 97 % (11/26 0900) Last BM Date :  (PTA)  Intake/Output from previous day: No intake/output data recorded. Intake/Output this shift: No intake/output data recorded.  PE: General: Pleasant female who is laying in bed in NAD. Heart: HR normal during encounter.  Lungs: Respiratory effort nonlabored. Abd: Soft. Moderate to significant distension. Appropriately tender, Abdominal binder present. Midline incision present with staples that is C/D/I. Right lower abdominal incision with staples that is C/D/I. Honeycomb dressing removed today Psych: A&Ox3 with an appropriate affect.    Lab Results:  Recent Labs    03/21/24 0326  WBC 5.9  HGB 11.2*  HCT 35.6*  PLT 267   BMET Recent Labs    03/22/24 0307 03/23/24 0232  NA 142 140  K 3.3* 4.3  CL 112* 113*  CO2 22 21*  GLUCOSE 185* 129*  BUN 19 21  CREATININE 0.87 0.82  CALCIUM 8.6* 8.8*   PT/INR No results for input(s): LABPROT, INR in the last 72 hours. CMP     Component Value Date/Time   NA 140 03/23/2024 0232   K 4.3 03/23/2024 0232   CL 113 (H) 03/23/2024 0232   CO2 21 (L) 03/23/2024 0232   GLUCOSE 129 (H) 03/23/2024 0232   BUN 21 03/23/2024 0232   CREATININE 0.82 03/23/2024 0232   CREATININE 0.91 11/06/2022 0148   CALCIUM 8.8 (L) 03/23/2024 0232   PROT 5.8 (L) 03/19/2024 0421   ALBUMIN  2.5 (L) 03/19/2024 0421   AST 24 03/19/2024 0421   ALT 25 03/19/2024 0421   ALKPHOS 77 03/19/2024 0421   BILITOT 1.8 (H) 03/19/2024 0421   GFRNONAA >60 03/23/2024 0232   GFRAA 58 (L) 10/18/2019 1255   Lipase      Component Value Date/Time   LIPASE 34 03/16/2024 2215       Studies/Results: DG Abd 1 View Result Date: 03/22/2024 EXAM: 1 VIEW XRAY OF THE ABDOMEN 03/22/2024 06:43:28 AM COMPARISON: KUB dated 07/13/2006. CLINICAL HISTORY: 822942 Nausea \\T \ vomiting [id number] Nausea \\T \ vomiting FINDINGS: BOWEL: Mild diffuse gaseous distention of the bowel. Mild formed fecal material within the right colon. There is no evidence of obstruction. SOFT TISSUES: The patient is status post recent laparotomy and bolus. Moses sutures are noted within the left mid abdomen. Several surgical clips are noted in the right lower quadrant. No opaque urinary calculi. BONES: No acute osseous abnormality. IMPRESSION: 1. No bowel obstruction. 2. Status post recent laparotomy with surgical clips in the right lower quadrant and sutures in the left mid abdomen. Electronically signed by: Evalene Coho MD 03/22/2024 06:54 AM EST RP Workstation: [id number]     Anti-infectives: Anti-infectives (From admission, onward)    Start     Dose/Rate Route Frequency Ordered Stop   03/17/24 0600  piperacillin -tazobactam (ZOSYN ) IVPB 3.375 g  Status:  Discontinued        3.375 g 12.5 mL/hr over 240 Minutes Intravenous Every 8 hours 03/17/24 0018 03/22/24 0949   03/17/24 0030  piperacillin -tazobactam (ZOSYN ) IVPB 3.375 g        3.375 g 100 mL/hr over 30 Minutes Intravenous  Once 03/17/24 0018 03/17/24 0134        Assessment/Plan POD5: LAPAROTOMY, EXPLORATORY, SMALL BOWEL RESECTION, WOUND EXPLORATION (N/A) - Repair of incarcerated Incisional hernia by Dr. Richerd Silversmith on 11/20 - Hypertensive. Afebrile. CBC pending - continue FLD, add ensure  - encouraged ambulation  - Multi-modal pain control: schedule tylenol , increase robaxin  to 1,000 mg QID, oxy to 5-10 mg q 4h PRN.  - Keep K >4.0, Mg >2.0 and Phos >3.0 to optimize bowel function  - Will order KUB before advancing diet further. Will also order suppository given  distension.   FEN: FLD, SLIV VTE: LMWH ID: [email or web address]  11/20>11/25    LOS: 6 days    Richerd Silversmith, MD  [facility] 03/23/2024, 9:21 AM Please see Amion for pager number during day hours 7:00am-4:30pm

## 2024-03-23 NOTE — TOC Transition Note (Signed)
 Transition of Care [facility]) - Discharge Note   Patient Details  Name: TANETTA FUHRIMAN MRN: [id number] Date of Birth: [birth year]  Transition of Care [facility] CM/SW Contact:  [name], RN Phone Number: 03/23/2024, 10:05 AM   Clinical Narrative:       Final next level of care: Home/Self Care Barriers to Discharge: No Barriers Identified   Patient Goals and CMS Choice    Plan for discharge to home. Patient's niece will provide transportation to home. Patient has improved with therapy and no longer has HH recommendation.   PCP appointment scheduled for patient with [facility]. Info on AVS.   No further needs identified by CM.        Discharge Placement                       Discharge Plan and Services Additional resources added to the After Visit Summary for                                       Social Drivers of Health (SDOH) Interventions SDOH Screenings   Food Insecurity: No Food Insecurity (03/17/2024)  Housing: Low Risk  (03/17/2024)  Transportation Needs: No Transportation Needs (03/17/2024)  Utilities: Not At Risk (03/17/2024)  Depression (PHQ2-9): Low Risk  (12/11/2022)  Tobacco Use: Medium Risk (03/17/2024)     Readmission Risk Interventions     No data to display

## 2024-03-24 DIAGNOSIS — I471 Supraventricular tachycardia, unspecified: Secondary | ICD-10-CM | POA: Diagnosis not present

## 2024-03-24 DIAGNOSIS — I479 Paroxysmal tachycardia, unspecified: Secondary | ICD-10-CM | POA: Diagnosis not present

## 2024-03-24 DIAGNOSIS — I10 Essential (primary) hypertension: Secondary | ICD-10-CM | POA: Diagnosis not present

## 2024-03-24 DIAGNOSIS — K56609 Unspecified intestinal obstruction, unspecified as to partial versus complete obstruction: Secondary | ICD-10-CM | POA: Diagnosis not present

## 2024-03-24 NOTE — Progress Notes (Signed)
 PROGRESS NOTE        PATIENT DETAILS Name: Denise Hudson Age: 63 y.o. Sex: female Date of Birth: [birth year] Admit Date: 03/16/2024 Admitting Physician Micaela Speaker, MD PCP:Pcp, No  Brief Summary: Patient is a 63 y.o.  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  Significant events: 11/20>> admit to [facility]  Significant studies: 11/19>> CT abdomen/pelvis: SBO-transition zone seen as a loop of small bowel enters lateral  ventral hernia. 11/20>> echo: EF 60-65%  Significant microbiology data: 11/20>> blood culture: No growth  Procedures: 11/20>> expiratory laparotomy with small bowel resection.  Consults: General Surgery Cards  Subjective: Had BM yesterday and early this morning.  Feels overall better.  Objective: Vitals: Blood pressure (!) 180/77, pulse 67, temperature 98.7 F (37.1 C), temperature source Oral, resp. rate 16, height 6' 1 (1.854 m), weight 67 kg, last menstrual period 09/23/2015, SpO2 95%.   Exam: Gen Exam:Alert awake-not in any distress HEENT:atraumatic, normocephalic Chest: B/L clear to auscultation anteriorly CVS:S1S2 regular Abdomen: Soft-nondistended-appropriately tender. Extremities:no edema Neurology: Non focal Skin: no rash  Pertinent Labs/Radiology:    Latest Ref Rng & Units 03/23/2024   10:03 AM 03/21/2024    3:26 AM 03/20/2024    3:29 AM  CBC  WBC 4.0 - 10.5 K/uL 5.6  5.9  7.0   Hemoglobin 12.0 - 15.0 g/dL 89.4  88.7  87.4   Hematocrit 36.0 - 46.0 % 34.2  35.6  39.5   Platelets 150 - 400 K/uL 288  267  255     Lab Results  Component Value Date   NA 140 03/23/2024   K 4.3 03/23/2024   CL 113 (H) 03/23/2024   CO2 21 (L) 03/23/2024      Assessment/Plan: SBO secondary to incarcerated incisional hernia S/p exploratory laparotomy/bowel resection by general surgery on 11/20 Slowly improving NG tube/Foley discontinued 11/22 Had BM 11/26 Tolerating full liquids Awaiting further recs mentations from general surgery. Will defer further postop care to general surgery service.  Paroxysmal supraventricular tachycardia Continues to have episodes of PSVT On beta-blocker-previous attempts at increasing doses of beta-blocker have resulted in bradycardia in the 40s. Cardiology now following-has been switched from metoprolol  to low-dose Coreg . Recent TSH/echo stable.    Hypertensive urgency Blood pressure is been better controlled-was significantly elevated over the weekend with SBP> 220. BP somewhat on the higher side but markedly better than past several days. Continue amlodipine /Coreg /Avapro /Aldactone .  AKI Mild Likely hemodynamically mediated Has resolved with supportive care.  Hypokalemia Repleted.  Code status:   Code Status: Full Code   DVT Prophylaxis: enoxaparin  (LOVENOX ) injection 40 mg Start: 03/18/24 1630 SCDs Start: 03/17/24 0023 Place TED hose Start: 03/17/24 0023   Family Communication: None at bedside   Disposition Plan: Status is: Inpatient Remains inpatient appropriate because: Severity of illness   Planned Discharge Destination:Home   Diet: Diet Order             Diet full liquid Room service appropriate? Yes; Fluid consistency: Thin  Diet effective now                     Antimicrobial agents: Anti-infectives (From admission, onward)    Start  Dose/Rate Route Frequency Ordered Stop   03/17/24 0600  piperacillin -tazobactam (ZOSYN ) IVPB 3.375 g  Status:  Discontinued        3.375 g 12.5 mL/hr over 240 Minutes Intravenous Every 8 hours 03/17/24 0018 03/22/24 0949   03/17/24 0030  piperacillin -tazobactam (ZOSYN ) IVPB 3.375 g        3.375 g 100 mL/hr over 30 Minutes  Intravenous  Once 03/17/24 0018 03/17/24 0134        MEDICATIONS: Scheduled Meds:  acetaminophen   1,000 mg Oral Q6H   Or   acetaminophen   650 mg Rectal Q6H   amLODipine   10 mg Oral Daily   carvedilol   3.125 mg Oral BID WC   enoxaparin  (LOVENOX ) injection  40 mg Subcutaneous Q24H   feeding supplement  1 Container Oral TID BM   feeding supplement  237 mL Oral BID BM   hydrALAZINE   50 mg Oral Q8H   irbesartan   300 mg Oral Daily   methocarbamol   1,000 mg Oral QID   pantoprazole  (PROTONIX ) IV  40 mg Intravenous Q12H   sodium chloride  flush  3 mL Intravenous Q12H   spironolactone   25 mg Oral Daily   Continuous Infusions:   PRN Meds:.ALPRAZolam , hydrALAZINE , HYDROmorphone  (DILAUDID ) injection, labetalol , ondansetron  **OR** ondansetron  (ZOFRAN ) IV, oxyCODONE , phenol, sodium chloride  flush   I have personally reviewed following labs and imaging studies  LABORATORY DATA: CBC: Recent Labs  Lab 03/19/24 0421 03/20/24 0329 03/21/24 0326 03/23/24 1003  WBC 7.4 7.0 5.9 5.6  HGB 11.7* 12.5 11.2* 10.5*  HCT 37.9 39.5 35.6* 34.2*  MCV 79.8* 78.2* 77.6* 79.0*  PLT 231 255 267 288    Basic Metabolic Panel: Recent Labs  Lab 03/19/24 0421 03/20/24 0329 03/21/24 0326 03/22/24 0307 03/23/24 0232  NA 144 144 140 142 140  K 3.9 3.3* 3.0* 3.3* 4.3  CL 109 110 108 112* 113*  CO2 24 22 21* 22 21*  GLUCOSE 91 100* 100* 185* 129*  BUN 21 22 19 19 21   CREATININE 0.93 0.81 0.81 0.87 0.82  CALCIUM 8.5* 8.7* 8.3* 8.6* 8.8*  MG 2.1  --  1.8 1.9 2.0  PHOS 2.5  --   --   --  3.7    GFR: Estimated Creatinine Clearance: 76.2 mL/min (by C-G formula based on SCr of 0.82 mg/dL).  Liver Function Tests: Recent Labs  Lab 03/19/24 0421  AST 24  ALT 25  ALKPHOS 77  BILITOT 1.8*  PROT 5.8*  ALBUMIN  2.5*   No results for input(s): LIPASE, AMYLASE in the last 168 hours.  No results for input(s): AMMONIA in the last 168 hours.  Coagulation Profile: No results for input(s):  INR, PROTIME in the last 168 hours.  Cardiac Enzymes: No results for input(s): CKTOTAL, CKMB, CKMBINDEX, TROPONINI in the last 168 hours.  BNP (last 3 results) No results for input(s): PROBNP in the last 8760 hours.  Lipid Profile: No results for input(s): CHOL, HDL, LDLCALC, TRIG, CHOLHDL, LDLDIRECT in the last 72 hours.  Thyroid Function Tests: No results for input(s): TSH, T4TOTAL, FREET4, T3FREE, THYROIDAB in the last 72 hours.   Anemia Panel: No results for input(s): VITAMINB12, FOLATE, FERRITIN, TIBC, IRON, RETICCTPCT in the last 72 hours.  Urine analysis:    Component Value Date/Time   COLORURINE YELLOW 03/16/2024 2359   APPEARANCEUR HAZY (A) 03/16/2024 2359   LABSPEC 1.040 (H) 03/16/2024 2359   PHURINE 6.0 03/16/2024 2359   GLUCOSEU NEGATIVE 03/16/2024 2359   HGBUR SMALL (A) 03/16/2024 2359   BILIRUBINUR  NEGATIVE 03/16/2024 2359   KETONESUR NEGATIVE 03/16/2024 2359   PROTEINUR 30 (A) 03/16/2024 2359   UROBILINOGEN 1.0 01/02/2015 0043   NITRITE NEGATIVE 03/16/2024 2359   LEUKOCYTESUR LARGE (A) 03/16/2024 2359    Sepsis Labs: Lactic Acid, Venous    Component Value Date/Time   LATICACIDVEN 2.1 (HH) 03/17/2024 0026    MICROBIOLOGY: Recent Results (from the past 240 hours)  Culture, blood (Routine X 2) w Reflex to ID Panel     Status: None   Collection Time: 03/17/24 12:40 AM   Specimen: BLOOD RIGHT ARM  Result Value Ref Range Status   Specimen Description BLOOD RIGHT ARM  Final   Special Requests   Final    BOTTLES DRAWN AEROBIC AND ANAEROBIC Blood Culture adequate volume   Culture   Final    NO GROWTH 5 DAYS Performed at [facility], [address] [place], [place] [address]    Report Status 03/22/2024 FINAL  Final  Culture, blood (Routine X 2) w Reflex to ID Panel     Status: None   Collection Time: 03/17/24 12:50 AM   Specimen: BLOOD  Result Value Ref Range Status   Specimen Description BLOOD  LEFT ANTECUBITAL  Final   Special Requests   Final    BOTTLES DRAWN AEROBIC AND ANAEROBIC Blood Culture adequate volume   Culture   Final    NO GROWTH 5 DAYS Performed at [facility], [address] [place], [place] [address]    Report Status 03/22/2024 FINAL  Final    RADIOLOGY STUDIES/RESULTS: DG Abd 1 View Result Date: 03/23/2024 EXAM: 1 VIEW XRAY OF THE ABDOMEN 03/23/2024 01:41:00 PM COMPARISON: 03/22/2024 CLINICAL HISTORY: [id number] Ileus (HCC) [id number] FINDINGS: BOWEL: Slight decrease in gaseous distension of small bowel. SOFT TISSUES: Right lower quadrant surgical staples and clips noted. Postsurgical changes are noted. No opaque urinary calculi. BONES: No acute osseous abnormality. IMPRESSION: 1. Slight decrease in gaseous distension of small bowel, consistent with postoperative ileus. Electronically signed by: Oneil Devonshire MD 03/23/2024 08:40 PM EST RP Workstation: [id number]      LOS: 7 days   Donalda Applebaum, MD  [facility]    To contact the attending provider between 7A-7P or the covering provider during after hours 7P-7A, please log into the web site [email or web address] and access using universal [place] password for that web site. If you do not have the password, please call the hospital operator.  03/24/2024, 10:27 AM

## 2024-03-24 NOTE — Plan of Care (Signed)
°  Problem: Education: Goal: Knowledge of General Education information will improve Description: Including pain rating scale, medication(s)/side effects and non-pharmacologic comfort measures Outcome: Progressing   Problem: Clinical Measurements: Goal: Ability to maintain clinical measurements within normal limits will improve Outcome: Progressing Goal: Will remain free from infection Outcome: Progressing Goal: Cardiovascular complication will be avoided Outcome: Progressing   Problem: Activity: Goal: Risk for activity intolerance will decrease Outcome: Progressing   Problem: Nutrition: Goal: Adequate nutrition will be maintained Outcome: Progressing

## 2024-03-24 NOTE — Progress Notes (Signed)
 Progress Note  7 Days Post-Op  Subjective: Tolerating FLD, doesn't feel distended any more.  Hungry.  Moving her bowels.  Pain is well controlled   Objective: Vital signs in last 24 hours: Temp:  [98.1 F (36.7 C)-98.7 F (37.1 C)] 98.7 F (37.1 C) (11/27 0828) Pulse Rate:  [63-82] 67 (11/27 0830) Resp:  [15-17] 16 (11/27 0830) BP: (148-190)/(64-119) 180/77 (11/27 0830) SpO2:  [95 %] 95 % (11/27 0830) Last BM Date : 03/24/24  Intake/Output from previous day: No intake/output data recorded. Intake/Output this shift: Total I/O In: 3 [I.V.:3] Out: -   PE: Abd: Soft. ND today.  Midline and RLQ incisions c/d/I with staples present.  Appropriately tender    Lab Results:  Recent Labs    03/23/24 1003  WBC 5.6  HGB 10.5*  HCT 34.2*  PLT 288   BMET Recent Labs    03/22/24 0307 03/23/24 0232  NA 142 140  K 3.3* 4.3  CL 112* 113*  CO2 22 21*  GLUCOSE 185* 129*  BUN 19 21  CREATININE 0.87 0.82  CALCIUM 8.6* 8.8*   PT/INR No results for input(s): LABPROT, INR in the last 72 hours. CMP     Component Value Date/Time   NA 140 03/23/2024 0232   K 4.3 03/23/2024 0232   CL 113 (H) 03/23/2024 0232   CO2 21 (L) 03/23/2024 0232   GLUCOSE 129 (H) 03/23/2024 0232   BUN 21 03/23/2024 0232   CREATININE 0.82 03/23/2024 0232   CREATININE 0.91 11/06/2022 0148   CALCIUM 8.8 (L) 03/23/2024 0232   PROT 5.8 (L) 03/19/2024 0421   ALBUMIN  2.5 (L) 03/19/2024 0421   AST 24 03/19/2024 0421   ALT 25 03/19/2024 0421   ALKPHOS 77 03/19/2024 0421   BILITOT 1.8 (H) 03/19/2024 0421   GFRNONAA >60 03/23/2024 0232   GFRAA 58 (L) 10/18/2019 1255   Lipase     Component Value Date/Time   LIPASE 34 03/16/2024 2215       Studies/Results: DG Abd 1 View Result Date: 03/23/2024 EXAM: 1 VIEW XRAY OF THE ABDOMEN 03/23/2024 01:41:00 PM COMPARISON: 03/22/2024 CLINICAL HISTORY: [id number] Ileus (HCC) [id number] FINDINGS: BOWEL: Slight decrease in gaseous distension of small bowel. SOFT  TISSUES: Right lower quadrant surgical staples and clips noted. Postsurgical changes are noted. No opaque urinary calculi. BONES: No acute osseous abnormality. IMPRESSION: 1. Slight decrease in gaseous distension of small bowel, consistent with postoperative ileus. Electronically signed by: Oneil Devonshire MD 03/23/2024 08:40 PM EST RP Workstation: [id number]     Anti-infectives: Anti-infectives (From admission, onward)    Start     Dose/Rate Route Frequency Ordered Stop   03/17/24 0600  piperacillin -tazobactam (ZOSYN ) IVPB 3.375 g  Status:  Discontinued        3.375 g 12.5 mL/hr over 240 Minutes Intravenous Every 8 hours 03/17/24 0018 03/22/24 0949   03/17/24 0030  piperacillin -tazobactam (ZOSYN ) IVPB 3.375 g        3.375 g 100 mL/hr over 30 Minutes Intravenous  Once 03/17/24 0018 03/17/24 0134        Assessment/Plan POD6: LAPAROTOMY, EXPLORATORY, SMALL BOWEL RESECTION, WOUND EXPLORATION (N/A) - Repair of incarcerated Incisional hernia by Dr. Richerd Silversmith on 11/20 - AF, normal WBC - doing well with FLD, hungry and less distended. Adv to soft diet  - encouraged ambulation  - Multi-modal pain control: schedule tylenol , increase robaxin  to 1,000 mg QID, oxy to 5-10 mg q 4h PRN.  - Keep K >4.0, Mg >2.0 and Phos >3.0 to  optimize bowel function  - hopefully home from surgical standpoint soon pending diet tolerance   FEN: soft, SLIV VTE: LMWH ID: [email or web address]  11/20>11/25    LOS: 7 days    Burnard FORBES Banter, [facility] 03/24/2024, 11:38 AM Please see Amion for pager number during day hours 7:00am-4:30pm

## 2024-03-24 NOTE — Plan of Care (Signed)
  Problem: Clinical Measurements: Goal: Diagnostic test results will improve Outcome: Progressing   Problem: Elimination: Goal: Will not experience complications related to bowel motility Outcome: Progressing   

## 2024-03-24 NOTE — Progress Notes (Signed)
 Rounding Note    Patient Name: Denise Hudson Date of Encounter: 03/24/2024  [facility] Cardiologist: Shelda Bruckner, MD   Subjective   No acute events. Advancing to solid food today. Pain well controlled. Has been ambulating without issues. Doesn't have a PCP, was on antihypertensives remotely but hasn't been on in several years. BP rechecked 157/61 after AM meds.  Inpatient Medications    Scheduled Meds:  acetaminophen   1,000 mg Oral Q6H   Or   acetaminophen   650 mg Rectal Q6H   amLODipine   10 mg Oral Daily   carvedilol   3.125 mg Oral BID WC   enoxaparin  (LOVENOX ) injection  40 mg Subcutaneous Q24H   feeding supplement  1 Container Oral TID BM   feeding supplement  237 mL Oral BID BM   hydrALAZINE   50 mg Oral Q8H   irbesartan   300 mg Oral Daily   methocarbamol   1,000 mg Oral QID   pantoprazole  (PROTONIX ) IV  40 mg Intravenous Q12H   sodium chloride  flush  3 mL Intravenous Q12H   spironolactone   25 mg Oral Daily   Continuous Infusions:  PRN Meds: ALPRAZolam , hydrALAZINE , HYDROmorphone  (DILAUDID ) injection, labetalol , ondansetron  **OR** ondansetron  (ZOFRAN ) IV, oxyCODONE , phenol, sodium chloride  flush   Vital Signs    Vitals:   03/24/24 0545 03/24/24 0546 03/24/24 0828 03/24/24 0830  BP:  (!) 173/83 (!) 180/77 (!) 180/77  Pulse: 75  82 67  Resp: 16   16  Temp:   98.7 F (37.1 C)   TempSrc:   Oral   SpO2:    95%  Weight:      Height:        Intake/Output Summary (Last 24 hours) at 03/24/2024 1117 Last data filed at 03/24/2024 9166 Gross per 24 hour  Intake 3 ml  Output --  Net 3 ml      03/16/2024   10:03 PM 12/11/2022    1:35 PM 11/06/2022    1:35 PM  Last 3 Weights  Weight (lbs) 147 lb 11.3 oz 148 lb 140 lb  Weight (kg) 67 kg 67.132 kg 63.504 kg      Telemetry    Sinus with PACs, two brief <30 seconds SVT in the 130s overnight - Personally Reviewed  Physical Exam   GEN: No acute distress.   Neck: No JVD Cardiac: RRR, no  murmurs, rubs, or gallops.  Respiratory: Clear to auscultation bilaterally. GI: Soft, +BS MS: No edema; No deformity. Neuro:  Nonfocal  Psych: Normal affect   New pertinent results (labs, ECG, imaging, cardiac studies)    Echo 03/17/24 personally reviewed  Assessment & Plan    Paroxysmal SVT -denies prior symptoms but has been asymptomatic with these events, so unclear if this is truly new and possibly 2/2 abdominal issues or if this has been happening for some time but she has been asymptomatic -echo reassuring -on carvedilol  low dose, need to be cautious about bradycardia -discussed 14 day Zio for burden at discharge. Her episodes are becoming less frequent, slower, and are brief. May wait to place until she is seen as an outpatient if this remains well controlled. -discussed EP referral if not well controlled on beta blocker -reviewed red flags that need medical attention -elevated troponins due to demand from rapid heart rate   Hypertension -continues to be elevated despite 5 antihypertensives. Was not on any medications prior to admission, but was also not routinely checked (no PCP). Pain is generally well controlled, do not suspect that this  is primary driver at this time. -stopped PRN clonidine  given risk of rebound -started spironolactone  11/25, K improved 11/26, no labs yet this AM -low dose carvedilol  as above, had bradycardia at higher dose beta blocker -continue amlodipine  -changed losartan  to irbesartan , first dose this AM.  -ideally would like to optimize meds and not need to continue hydralazine  long term if possible, however, if BP not improved after irbesartan  this AM, will need to upitrate hydralazine  later today -her blood pressure isn't yet at goal, but it is at a reasonable level that we can continue to titrate as an outpatient. We will adjust while she is here, but she is ok for discharge from cardiac perspective whenever surgery/primary team feel she is ready.    SBO, incarcerated incisional hernia s/p exploratory laparotomy: per primary team and surgery.     Signed, Shelda Bruckner, MD  03/24/2024, 11:17 AM

## 2024-03-25 DIAGNOSIS — I479 Paroxysmal tachycardia, unspecified: Secondary | ICD-10-CM | POA: Diagnosis not present

## 2024-03-25 DIAGNOSIS — K56609 Unspecified intestinal obstruction, unspecified as to partial versus complete obstruction: Secondary | ICD-10-CM | POA: Diagnosis not present

## 2024-03-25 DIAGNOSIS — I10 Essential (primary) hypertension: Secondary | ICD-10-CM | POA: Diagnosis not present

## 2024-03-25 DIAGNOSIS — I471 Supraventricular tachycardia, unspecified: Secondary | ICD-10-CM | POA: Diagnosis not present

## 2024-03-25 LAB — BASIC METABOLIC PANEL WITH GFR
Anion gap: 9 (ref 5–15)
BUN: 23 mg/dL (ref 8–23)
CO2: 22 mmol/L (ref 22–32)
Calcium: 8.8 mg/dL — ABNORMAL LOW (ref 8.9–10.3)
Chloride: 107 mmol/L (ref 98–111)
Creatinine, Ser: 1.04 mg/dL — ABNORMAL HIGH (ref 0.44–1.00)
GFR, Estimated: >60 mL/min (ref >60)
Glucose, Bld: 120 mg/dL — ABNORMAL HIGH (ref 70–99)
Potassium: 4 mmol/L (ref 3.5–5.1)
Sodium: 138 mmol/L (ref 135–145)

## 2024-03-25 MED ORDER — PANTOPRAZOLE SODIUM 40 MG PO TBEC
40.0000 mg | DELAYED_RELEASE_TABLET | Freq: Two times a day (BID) | ORAL | Status: DC
  Administered 2024-03-25 – 2024-03-30 (×10): 40 mg via ORAL
  Filled 2024-03-25 (×10): qty 1

## 2024-03-25 MED ORDER — HYDRALAZINE HCL 50 MG PO TABS
75.0000 mg | ORAL_TABLET | Freq: Three times a day (TID) | ORAL | Status: DC
  Administered 2024-03-25 – 2024-03-30 (×15): 75 mg via ORAL
  Filled 2024-03-25 (×15): qty 1

## 2024-03-25 NOTE — Progress Notes (Signed)
 Physical Therapy Treatment and Discharge Patient Details Name: Denise Hudson MRN: [id number] DOB: June 05, 1960 Today's Date: 03/25/2024   History of Present Illness 63 yo female presents to [facility] 11/19 with R abdominal pain, n/v. CT shows incisional hernia with inflamed mesenteric fat, fluid and short segment of thickened small bowel with narrowing as it enters hernia. S/p open incisional hernia repair, ex lap, small bowel resection 11/21. 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    PT Comments  Pt met her physical therapy goals during her inpatient stay. Pt performing bed mobility with HOB flat with good log roll technique, transferring and ambulating independently. Pt ambulating close to community distances with no AD without physical difficulty. Reports 5/10 abdominal pain and denies nausea. One episode of SVT towards end of walk, up to 160, but resolved quickly back to 90's and pt asymptomatic. No further acute or follow up PT needs. Thank you for this consult.   If plan is discharge home, recommend the following:     Can travel by private vehicle        Equipment Recommendations  None recommended by PT    Recommendations for Other Services       Precautions / Restrictions Precautions Precautions: Fall;Other (comment) Recall of Precautions/Restrictions: Intact Precaution/Restrictions Comments: abdominal - log roll in and out of bed; periods of tachycardia up to 170's that quickly resolves to 80s bpm Restrictions Weight Bearing Restrictions Per Provider Order: No     Mobility  Bed Mobility Overal bed mobility: Modified Independent                  Transfers Overall transfer level: Independent Equipment used: None                    Ambulation/Gait Ambulation/Gait assistance:  Independent Gait Distance (Feet): 800 Feet Assistive device: None Gait Pattern/deviations: Decreased stride length, Step-through pattern           Stairs             Wheelchair Mobility     Tilt Bed    Modified Rankin (Stroke Patients Only)       Balance Overall balance assessment: No apparent balance deficits (not formally assessed)                                          Communication Communication Communication: No apparent difficulties  Cognition Arousal: Alert Behavior During Therapy: WFL for tasks assessed/performed   PT - Cognitive impairments: No apparent impairments                         Following commands: Intact Following commands impaired: Follows multi-step commands with increased time    Cueing Cueing Techniques: Verbal cues, Gestural cues  Exercises      General Comments        Pertinent Vitals/Pain Pain Assessment Pain Assessment: 0-10 Pain Score: 5  Pain Location: abdomen Pain Descriptors / Indicators: Sore, Discomfort Pain Intervention(s): Monitored during session    Home Living                          Prior Function            PT Goals (current  goals can now be found in the care plan section) Acute Rehab PT Goals Potential to Achieve Goals: Good Progress towards PT goals: Goals met/education completed, patient discharged from PT    Frequency     (d/c acute PT)      PT Plan      Co-evaluation              AM-PAC PT 6 Clicks Mobility   Outcome Measure  Help needed turning from your back to your side while in a flat bed without using bedrails?: None Help needed moving from lying on your back to sitting on the side of a flat bed without using bedrails?: None Help needed moving to and from a bed to a chair (including a wheelchair)?: None Help needed standing up from a chair using your arms (e.g., wheelchair or bedside chair)?: None Help needed to walk in hospital  room?: None Help needed climbing 3-5 steps with a railing? : None 6 Click Score: 24    End of Session   Activity Tolerance: Patient tolerated treatment well Patient left: in bed;with call bell/phone within reach   PT Visit Diagnosis: Muscle weakness (generalized) (M62.81)     Time: 1010-1020 PT Time Calculation (min) (ACUTE ONLY): 10 min  Charges:    $Therapeutic Activity: 8-22 mins PT General Charges $$ ACUTE PT VISIT: 1 Visit                     Aleck Daring, PT, DPT Acute Rehabilitation Services Office [phone]    Aleck ONEIDA Daring 03/25/2024, 10:29 AM

## 2024-03-25 NOTE — Plan of Care (Signed)

## 2024-03-25 NOTE — Progress Notes (Signed)
 Progress Note  8 Days Post-Op  Subjective: Tolerating diet, reports continued flatus but no bowel movement yesterday.  Reports pain is well-controlled, denies nausea.   Objective: Vital signs in last 24 hours: Temp:  [97.4 F (36.3 C)-98.9 F (37.2 C)] 97.4 F (36.3 C) (11/28 0803) Pulse Rate:  [58-84] 84 (11/28 0831) Resp:  [14-18] 17 (11/28 0800) BP: (140-182)/(63-84) 175/84 (11/28 0800) SpO2:  [91 %-96 %] 91 % (11/28 0800) Last BM Date : 03/24/24  Intake/Output from previous day: 11/27 0701 - 11/28 0700 In: 3 [I.V.:3] Out: -  Intake/Output this shift: Total I/O In: 3 [I.V.:3] Out: -   PE: Abd: Soft.  Minimally distended today.  Midline and RLQ incisions c/d/I with staples present, no cellulitis or hematoma.  Appropriately tender    Lab Results:  Recent Labs    03/23/24 1003  WBC 5.6  HGB 10.5*  HCT 34.2*  PLT 288   BMET Recent Labs    03/23/24 0232 03/25/24 0446  NA 140 138  K 4.3 4.0  CL 113* 107  CO2 21* 22  GLUCOSE 129* 120*  BUN 21 23  CREATININE 0.82 1.04*  CALCIUM 8.8* 8.8*   PT/INR No results for input(s): LABPROT, INR in the last 72 hours. CMP     Component Value Date/Time   NA 138 03/25/2024 0446   K 4.0 03/25/2024 0446   CL 107 03/25/2024 0446   CO2 22 03/25/2024 0446   GLUCOSE 120 (H) 03/25/2024 0446   BUN 23 03/25/2024 0446   CREATININE 1.04 (H) 03/25/2024 0446   CREATININE 0.91 11/06/2022 0148   CALCIUM 8.8 (L) 03/25/2024 0446   PROT 5.8 (L) 03/19/2024 0421   ALBUMIN  2.5 (L) 03/19/2024 0421   AST 24 03/19/2024 0421   ALT 25 03/19/2024 0421   ALKPHOS 77 03/19/2024 0421   BILITOT 1.8 (H) 03/19/2024 0421   GFRNONAA >60 03/25/2024 0446   GFRAA 58 (L) 10/18/2019 1255   Lipase     Component Value Date/Time   LIPASE 34 03/16/2024 2215       Studies/Results: DG Abd 1 View Result Date: 03/23/2024 EXAM: 1 VIEW XRAY OF THE ABDOMEN 03/23/2024 01:41:00 PM COMPARISON: 03/22/2024 CLINICAL HISTORY: 01250 Ileus (HCC)  [id number] FINDINGS: BOWEL: Slight decrease in gaseous distension of small bowel. SOFT TISSUES: Right lower quadrant surgical staples and clips noted. Postsurgical changes are noted. No opaque urinary calculi. BONES: No acute osseous abnormality. IMPRESSION: 1. Slight decrease in gaseous distension of small bowel, consistent with postoperative ileus. Electronically signed by: Oneil Devonshire MD 03/23/2024 08:40 PM EST RP Workstation: [id number]     Anti-infectives: Anti-infectives (From admission, onward)    Start     Dose/Rate Route Frequency Ordered Stop   03/17/24 0600  piperacillin -tazobactam (ZOSYN ) IVPB 3.375 g  Status:  Discontinued        3.375 g 12.5 mL/hr over 240 Minutes Intravenous Every 8 hours 03/17/24 0018 03/22/24 0949   03/17/24 0030  piperacillin -tazobactam (ZOSYN ) IVPB 3.375 g        3.375 g 100 mL/hr over 30 Minutes Intravenous  Once 03/17/24 0018 03/17/24 0134        Assessment/Plan POD7: LAPAROTOMY, EXPLORATORY, SMALL BOWEL RESECTION, WOUND EXPLORATION (N/A) - Repair of incarcerated Incisional hernia by Dr. Richerd Silversmith on 11/20 - Continue soft diet - encouraged ambulation  - Multi-modal pain control: schedule tylenol , increase robaxin  to 1,000 mg QID, oxy to 5-10 mg q 4h PRN.  - Keep K >4.0, Mg >2.0 and Phos >3.0 to optimize bowel  function  - Anticipate she will be ready to discharge tomorrow   FEN: soft, SLIV VTE: LMWH ID: [email or web address]  11/20>11/25    LOS: 8 days    Denise DELENA Freund, MD  [facility] 03/25/2024, 9:33 AM Please see Amion for pager number during day hours 7:00am-4:30pm

## 2024-03-25 NOTE — Progress Notes (Signed)
  Progress Note  Patient Name: Denise Hudson Date of Encounter: 03/25/2024 Odin Hudson Cardiologist: Denise Bruckner, MD   Interval Summary   Resting comfortably in bed Denies any palpitations Likely discharging tomorrow per patient  Will arrange outpatient follow up for possible monitor and adjusting hypertensive therapy   Vital Signs Vitals:   03/25/24 0548 03/25/24 0800 03/25/24 0803 03/25/24 0831  BP: (!) 150/74 (!) 175/84    Pulse:  60  84  Resp:  17    Temp:   (!) 97.4 F (36.3 C)   TempSrc:   Oral   SpO2:  91%    Weight:      Height:        Intake/Output Summary (Last 24 hours) at 03/25/2024 9077 Last data filed at 03/25/2024 0827 Gross per 24 hour  Intake 3 ml  Output --  Net 3 ml      03/16/2024   10:03 PM 12/11/2022    1:35 PM 11/06/2022    1:35 PM  Last 3 Weights  Weight (lbs) 147 lb 11.3 oz 148 lb 140 lb  Weight (kg) 67 kg 67.132 kg 63.504 kg     Telemetry/ECG  Sinus rhythm, HR 50-70s, frequent PACs - Personally Reviewed  Physical Exam  GEN: No acute distress.   Neck: No JVD Cardiac: RRR, no murmurs, rubs, or gallops.  Respiratory: Clear to auscultation bilaterally. GI: Soft, nontender, non-distended  MS: No edema  Assessment & Plan   Paroxysmal SVT Asymptomatic with episodes of SVT Echo; LVEF 60-65%, no RWMA, G1DD, moderate MS Continue carvedilol  3.125 mg BID Possible 14 day Zio for SVT burden when seen at outpatient follow up May require EP referral if not well controlled with beta-blocker   Hypertension Not well controlled on arrival, trending down Presented with BP 209/79, SBP down to 140s this admission Renal function stable  Continue carvedilol  3.125 mg BID  Continue amlodipine  10 mg daily Continue hydralazine  50 mg TID  Continue irbesartan  300 mg daily Continue spironolactone  25 mg daily  Continue to titrate up medications as outpatient for further control    Per primary  SBO Incarcerated incisional  hernia AKI Electrolyte disturbances  For questions or updates, please contact Denise Hudson Please consult [email or web address] for contact info under   Signed, Denise [name], PA-C

## 2024-03-25 NOTE — TOC Transition Note (Signed)
 Transition of Care [facility]) - Discharge Note   Patient Details  Name: SHYLYN YOUNCE MRN: [id number] Date of Birth: May 15, 1960  Transition of Care [facility]) CM/SW Contact:  [name], RN Phone Number: 03/25/2024, 2:09 PM   Clinical Narrative:     Patient has transportation in place at d/c. Patient no longer has HH needs for therapy, has PCP appt scheduled and info on AVS.  Plan for potential d/c 11/29.  No other needs identified by CM.  Final next level of care: Home/Self Care Barriers to Discharge: Continued Medical Work up   Patient Goals and CMS Choice            Discharge Placement                       Discharge Plan and Services Additional resources added to the After Visit Summary for                                       Social Drivers of Health (SDOH) Interventions SDOH Screenings   Food Insecurity: No Food Insecurity (03/17/2024)  Housing: Low Risk  (03/17/2024)  Transportation Needs: No Transportation Needs (03/17/2024)  Utilities: Not At Risk (03/17/2024)  Depression (PHQ2-9): Low Risk  (12/11/2022)  Tobacco Use: Medium Risk (03/17/2024)     Readmission Risk Interventions     No data to display

## 2024-03-25 NOTE — Progress Notes (Signed)
 PROGRESS NOTE        PATIENT DETAILS Name: Denise Hudson Age: 63 y.o. Sex: female Date of Birth: [birth year] Admit Date: 03/16/2024 Admitting Physician Micaela Speaker, MD PCP:Pcp, No  Brief Summary: Patient is a 63 y.o.  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  Significant events: 11/20>> admit to [facility]  Significant studies: 11/19>> CT abdomen/pelvis: SBO-transition zone seen as a loop of small bowel enters lateral  ventral hernia. 11/20>> echo: EF 60-65%  Significant microbiology data: 11/20>> blood culture: No growth  Procedures: 11/20>> expiratory laparotomy with small bowel resection.  Consults: General Surgery Cards  Subjective: Had BM yesterday and early this morning.  Feels overall better.  Objective: Vitals: Blood pressure (!) 175/84, pulse 84, temperature (!) 97.4 F (36.3 C), temperature source Oral, resp. rate 17, height 6' 1 (1.854 m), weight 67 kg, last menstrual period 09/23/2015, SpO2 91%.   Exam: Gen Exam:Alert awake-not in any distress HEENT:atraumatic, normocephalic Chest: B/L clear to auscultation anteriorly CVS:S1S2 regular Abdomen:soft non tender, non distended Extremities:no edema Neurology: Non focal Skin: no rash  Pertinent Labs/Radiology:    Latest Ref Rng & Units 03/23/2024   10:03 AM 03/21/2024    3:26 AM 03/20/2024    3:29 AM  CBC  WBC 4.0 - 10.5 K/uL 5.6  5.9  7.0   Hemoglobin 12.0 - 15.0 g/dL 89.4  88.7  87.4   Hematocrit 36.0 - 46.0 % 34.2  35.6  39.5   Platelets 150 - 400 K/uL 288  267  255     Lab Results  Component Value Date   NA 138 03/25/2024   K 4.0 03/25/2024   CL 107 03/25/2024   CO2 22 03/25/2024      Assessment/Plan: SBO secondary to incarcerated incisional hernia S/p exploratory laparotomy/bowel resection by general surgery on 11/20 Slowly improving NG tube/Foley discontinued 11/22 Had BM 11/26 Tolerating soft diet Appreciate general surgery input-monitor for 1 more day-is stable-likely home on 11/29.  Paroxysmal supraventricular tachycardia Continues to have episodes of PSVT On beta-blocker-previous attempts at increasing doses of beta-blocker have resulted in bradycardia in the 40s. Cardiology now following-has been switched from metoprolol  to low-dose Coreg . Recent TSH/echo stable.    Hypertensive urgency BP continues to fluctuate quite a bit-somewhat on the higher side Continue amlodipine /Coreg /Avapro /Aldactone /hydralazine .  AKI Mild Likely hemodynamically mediated Has resolved with supportive care.  Hypokalemia Repleted.  Code status:   Code Status: Full Code   DVT Prophylaxis: enoxaparin  (LOVENOX ) injection 40 mg Start: 03/18/24 1630 SCDs Start: 03/17/24 0023 Place TED hose Start: 03/17/24 0023   Family Communication: None at bedside   Disposition Plan: Status is: Inpatient Remains inpatient appropriate because: Severity of illness   Planned Discharge Destination:Home   Diet: Diet Order             DIET SOFT Room service appropriate? Yes; Fluid consistency: Thin  Diet effective now                     Antimicrobial agents: Anti-infectives (From admission, onward)    Start     Dose/Rate Route Frequency Ordered Stop   03/17/24 0600  piperacillin -tazobactam (ZOSYN ) IVPB 3.375 g  Status:  Discontinued        3.375 g 12.5 mL/hr over 240 Minutes Intravenous Every 8 hours 03/17/24 0018 03/22/24 0949   03/17/24 0030  piperacillin -tazobactam (ZOSYN ) IVPB 3.375 g        3.375 g 100 mL/hr over 30 Minutes Intravenous  Once 03/17/24 0018 03/17/24 0134        MEDICATIONS: Scheduled Meds:  acetaminophen   1,000 mg Oral Q6H   Or    acetaminophen   650 mg Rectal Q6H   amLODipine   10 mg Oral Daily   carvedilol   3.125 mg Oral BID WC   enoxaparin  (LOVENOX ) injection  40 mg Subcutaneous Q24H   feeding supplement  1 Container Oral TID BM   feeding supplement  237 mL Oral BID BM   hydrALAZINE   75 mg Oral Q8H   irbesartan   300 mg Oral Daily   methocarbamol   1,000 mg Oral QID   pantoprazole  (PROTONIX ) IV  40 mg Intravenous Q12H   sodium chloride  flush  3 mL Intravenous Q12H   spironolactone   25 mg Oral Daily   Continuous Infusions:   PRN Meds:.ALPRAZolam , hydrALAZINE , HYDROmorphone  (DILAUDID ) injection, labetalol , ondansetron  **OR** ondansetron  (ZOFRAN ) IV, oxyCODONE , phenol, sodium chloride  flush   I have personally reviewed following labs and imaging studies  LABORATORY DATA: CBC: Recent Labs  Lab 03/19/24 0421 03/20/24 0329 03/21/24 0326 03/23/24 1003  WBC 7.4 7.0 5.9 5.6  HGB 11.7* 12.5 11.2* 10.5*  HCT 37.9 39.5 35.6* 34.2*  MCV 79.8* 78.2* 77.6* 79.0*  PLT 231 255 267 288    Basic Metabolic Panel: Recent Labs  Lab 03/19/24 0421 03/20/24 0329 03/21/24 0326 03/22/24 0307 03/23/24 0232 03/25/24 0446  NA 144 144 140 142 140 138  K 3.9 3.3* 3.0* 3.3* 4.3 4.0  CL 109 110 108 112* 113* 107  CO2 24 22 21* 22 21* 22  GLUCOSE 91 100* 100* 185* 129* 120*  BUN 21 22 19 19 21 23   CREATININE 0.93 0.81 0.81 0.87 0.82 1.04*  CALCIUM 8.5* 8.7* 8.3* 8.6* 8.8* 8.8*  MG 2.1  --  1.8 1.9 2.0  --   PHOS 2.5  --   --   --  3.7  --     GFR: Estimated Creatinine Clearance: 60.1 mL/min (A) (by C-G formula based on SCr of 1.04 mg/dL (H)).  Liver Function Tests: Recent Labs  Lab 03/19/24 0421  AST 24  ALT 25  ALKPHOS 77  BILITOT 1.8*  PROT 5.8*  ALBUMIN  2.5*   No results for input(s): LIPASE, AMYLASE in the last 168 hours.  No results for input(s): AMMONIA in the last 168 hours.  Coagulation Profile: No results for input(s): INR, PROTIME in the last 168 hours.  Cardiac Enzymes: No  results for input(s): CKTOTAL, CKMB, CKMBINDEX, TROPONINI in the last 168 hours.  BNP (last 3 results) No results for input(s): PROBNP in the last 8760 hours.  Lipid Profile: No results for input(s): CHOL, HDL, LDLCALC, TRIG, CHOLHDL, LDLDIRECT in the last 72 hours.  Thyroid Function Tests: No results for input(s): TSH, T4TOTAL, FREET4, T3FREE, THYROIDAB in the last 72 hours.   Anemia Panel: No results for input(s): VITAMINB12, FOLATE, FERRITIN, TIBC, IRON, RETICCTPCT in the last 72 hours.  Urine analysis:    Component Value Date/Time   COLORURINE YELLOW 03/16/2024 2359   APPEARANCEUR HAZY (A) 03/16/2024 2359   LABSPEC 1.040 (H) 03/16/2024 2359   PHURINE 6.0 03/16/2024 2359   GLUCOSEU NEGATIVE 03/16/2024 2359   HGBUR SMALL (A) 03/16/2024 2359   BILIRUBINUR  NEGATIVE 03/16/2024 2359   KETONESUR NEGATIVE 03/16/2024 2359   PROTEINUR 30 (A) 03/16/2024 2359   UROBILINOGEN 1.0 01/02/2015 0043   NITRITE NEGATIVE 03/16/2024 2359   LEUKOCYTESUR LARGE (A) 03/16/2024 2359    Sepsis Labs: Lactic Acid, Venous    Component Value Date/Time   LATICACIDVEN 2.1 (HH) 03/17/2024 0026    MICROBIOLOGY: Recent Results (from the past 240 hours)  Culture, blood (Routine X 2) w Reflex to ID Panel     Status: None   Collection Time: 03/17/24 12:40 AM   Specimen: BLOOD RIGHT ARM  Result Value Ref Range Status   Specimen Description BLOOD RIGHT ARM  Final   Special Requests   Final    BOTTLES DRAWN AEROBIC AND ANAEROBIC Blood Culture adequate volume   Culture   Final    NO GROWTH 5 DAYS Performed at [facility], [address], [place], [place] [address]    Report Status 03/22/2024 FINAL  Final  Culture, blood (Routine X 2) w Reflex to ID Panel     Status: None   Collection Time: 03/17/24 12:50 AM   Specimen: BLOOD  Result Value Ref Range Status   Specimen Description BLOOD LEFT ANTECUBITAL  Final   Special Requests   Final    BOTTLES  DRAWN AEROBIC AND ANAEROBIC Blood Culture adequate volume   Culture   Final    NO GROWTH 5 DAYS Performed at [facility], [address] [place], [place] [address]    Report Status 03/22/2024 FINAL  Final    RADIOLOGY STUDIES/RESULTS: DG Abd 1 View Result Date: 03/23/2024 EXAM: 1 VIEW XRAY OF THE ABDOMEN 03/23/2024 01:41:00 PM COMPARISON: 03/22/2024 CLINICAL HISTORY: [id number] Ileus (HCC) [id number] FINDINGS: BOWEL: Slight decrease in gaseous distension of small bowel. SOFT TISSUES: Right lower quadrant surgical staples and clips noted. Postsurgical changes are noted. No opaque urinary calculi. BONES: No acute osseous abnormality. IMPRESSION: 1. Slight decrease in gaseous distension of small bowel, consistent with postoperative ileus. Electronically signed by: Oneil Devonshire MD 03/23/2024 08:40 PM EST RP Workstation: [id number]      LOS: 8 days   Donalda Applebaum, MD  [facility]    To contact the attending provider between 7A-7P or the covering provider during after hours 7P-7A, please log into the web site [email or web address] and access using universal [place] password for that web site. If you do not have the password, please call the hospital operator.  03/25/2024, 9:38 AM

## 2024-03-26 ENCOUNTER — Other Ambulatory Visit (HOSPITAL_COMMUNITY): Payer: Self-pay

## 2024-03-26 DIAGNOSIS — I10 Essential (primary) hypertension: Secondary | ICD-10-CM | POA: Diagnosis not present

## 2024-03-26 DIAGNOSIS — I471 Supraventricular tachycardia, unspecified: Secondary | ICD-10-CM | POA: Diagnosis not present

## 2024-03-26 LAB — BASIC METABOLIC PANEL WITH GFR
Anion gap: 13 (ref 5–15)
BUN: 27 mg/dL — ABNORMAL HIGH (ref 8–23)
CO2: 22 mmol/L (ref 22–32)
Calcium: 9 mg/dL (ref 8.9–10.3)
Chloride: 104 mmol/L (ref 98–111)
Creatinine, Ser: 0.89 mg/dL (ref 0.44–1.00)
GFR, Estimated: >60 mL/min (ref >60)
Glucose, Bld: 101 mg/dL — ABNORMAL HIGH (ref 70–99)
Potassium: 3.9 mmol/L (ref 3.5–5.1)
Sodium: 139 mmol/L (ref 135–145)

## 2024-03-26 LAB — CBC WITH DIFFERENTIAL/PLATELET
Abs Immature Granulocytes: 0.1 K/uL — ABNORMAL HIGH (ref 0.00–0.07)
Basophils Absolute: 0.1 K/uL (ref 0.0–0.1)
Basophils Relative: 1 %
Eosinophils Absolute: 0.2 K/uL (ref 0.0–0.5)
Eosinophils Relative: 4 %
HCT: 34.6 % — ABNORMAL LOW (ref 36.0–46.0)
Hemoglobin: 10.9 g/dL — ABNORMAL LOW (ref 12.0–15.0)
Immature Granulocytes: 2 %
Lymphocytes Relative: 29 %
Lymphs Abs: 1.7 K/uL (ref 0.7–4.0)
MCH: 24.6 pg — ABNORMAL LOW (ref 26.0–34.0)
MCHC: 31.5 g/dL (ref 30.0–36.0)
MCV: 78.1 fL — ABNORMAL LOW (ref 80.0–100.0)
Monocytes Absolute: 0.6 K/uL (ref 0.1–1.0)
Monocytes Relative: 10 %
Neutro Abs: 3.1 K/uL (ref 1.7–7.7)
Neutrophils Relative %: 54 %
Platelets: 362 K/uL (ref 150–400)
RBC: 4.43 MIL/uL (ref 3.87–5.11)
RDW: 15 % (ref 11.5–15.5)
WBC: 5.7 K/uL (ref 4.0–10.5)
nRBC: 0 % (ref 0.0–0.2)

## 2024-03-26 LAB — PHOSPHORUS: Phosphorus: 4.2 mg/dL (ref 2.5–4.6)

## 2024-03-26 LAB — MAGNESIUM: Magnesium: 1.9 mg/dL (ref 1.7–2.4)

## 2024-03-26 MED ORDER — OXYCODONE HCL 5 MG PO TABS
5.0000 mg | ORAL_TABLET | Freq: Three times a day (TID) | ORAL | 0 refills | Status: AC | PRN
  Filled 2024-03-26: qty 10, 4d supply, fill #0

## 2024-03-26 MED ORDER — DOCUSATE SODIUM 100 MG PO CAPS
100.0000 mg | ORAL_CAPSULE | Freq: Two times a day (BID) | ORAL | 0 refills | Status: AC
  Filled 2024-03-26: qty 30, 15d supply, fill #0

## 2024-03-26 MED ORDER — POLYETHYLENE GLYCOL 3350 17 G PO PACK
17.0000 g | PACK | Freq: Once | ORAL | Status: AC
  Administered 2024-03-26: 17 g via ORAL
  Filled 2024-03-26: qty 1

## 2024-03-26 MED ORDER — BISACODYL 10 MG RE SUPP
10.0000 mg | Freq: Once | RECTAL | Status: AC
  Administered 2024-03-26: 10 mg via RECTAL
  Filled 2024-03-26: qty 1

## 2024-03-26 NOTE — Plan of Care (Signed)
   Problem: Education: Goal: Knowledge of General Education information will improve Description Including pain rating scale, medication(s)/side effects and non-pharmacologic comfort measures Outcome: Progressing   Problem: Health Behavior/Discharge Planning: Goal: Ability to manage health-related needs will improve Outcome: Progressing   Problem: Clinical Measurements: Goal: Ability to maintain clinical measurements within normal limits will improve Outcome: Progressing Goal: Will remain free from infection Outcome: Progressing Goal: Diagnostic test results will improve Outcome: Progressing Goal: Respiratory complications will improve Outcome: Progressing Goal: Cardiovascular complication will be avoided Outcome: Progressing   Problem: Elimination: Goal: Will not experience complications related to bowel motility Outcome: Progressing Goal: Will not experience complications related to urinary retention Outcome: Progressing

## 2024-03-26 NOTE — Progress Notes (Addendum)
 PROGRESS NOTE        PATIENT DETAILS Name: Denise Hudson Age: 63 y.o. Sex: female Date of Birth: [birth year] Admit Date: 03/16/2024 Admitting Physician Micaela Speaker, MD PCP:Pcp, No  Brief Summary: Patient is a 63 y.o.  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  Significant events: 11/20>> admit to [facility]  Significant studies: 11/19>> CT abdomen/pelvis: SBO-transition zone seen as a loop of small bowel enters lateral  ventral hernia. 11/20>> echo: EF 60-65%  Significant microbiology data: 11/20>> blood culture: No growth  Procedures: 11/20>> expiratory laparotomy with small bowel resection.  Consults: General Surgery Cards  Subjective: Although overall improved-feels somewhat bloated-did not have a BM yesterday.  Objective: Vitals: Blood pressure (!) 179/86, pulse 63, temperature 98.7 F (37.1 C), temperature source Oral, resp. rate 16, height 6' 1 (1.854 m), weight 67 kg, last menstrual period 09/23/2015, SpO2 97%.   Exam: Gen Exam:Alert awake-not in any distress HEENT:atraumatic, normocephalic Chest: B/L clear to auscultation anteriorly CVS:S1S2 regular Abdomen: Soft-appropriately tender-only mildly.  No significant distention. Extremities:no edema Neurology: Non focal Skin: no rash  Pertinent Labs/Radiology:    Latest Ref Rng & Units 03/26/2024    5:05 AM 03/23/2024   10:03 AM 03/21/2024    3:26 AM  CBC  WBC 4.0 - 10.5 K/uL 5.7  5.6  5.9   Hemoglobin 12.0 - 15.0 g/dL 89.0  89.4  88.7   Hematocrit 36.0 - 46.0 % 34.6  34.2  35.6   Platelets 150 - 400 K/uL 362  288  267     Lab Results  Component Value Date   NA 139 03/26/2024   K 3.9 03/26/2024   CL 104 03/26/2024    CO2 22 03/26/2024     Assessment/Plan: SBO secondary to incarcerated incisional hernia S/p exploratory laparotomy/bowel resection by general surgery on 11/20 Slowly improving NG tube/Foley discontinued 11/22 Had BM 11/26 Tolerating soft diet Has not had a BM since 11/26-feels bloated/distended today-however exam is stable-discussed with general surgery-suppository x 1 today-and see how she does.  Paroxysmal supraventricular tachycardia Continues to have episodes of PSVT Previously on metoprolol -previous attempts at increasing doses of metoprolol  have resulted in bradycardia in the 40s Cardiology now following-has been switched from metoprolol  to low-dose Coreg -which she seems to be tolerating fine-with stable heart rate. Recent TSH/echo stable.    Hypertensive urgency BP continues to fluctuate quite a bit-somewhat on the higher side Continue amlodipine /Coreg /Avapro /Aldactone /hydralazine .  AKI Mild Likely hemodynamically mediated Has resolved with supportive care.  Hypokalemia Repleted.  Code status:   Code Status: Full Code   DVT Prophylaxis: enoxaparin  (LOVENOX ) injection 40 mg Start: 03/18/24 1630 SCDs Start: 03/17/24 0023 Place TED hose Start: 03/17/24 0023   Family Communication: None at bedside   Disposition Plan: Status is: Inpatient Remains inpatient appropriate because: Severity of illness   Planned Discharge Destination:Home   Diet: Diet Order             DIET SOFT Room service appropriate? Yes; Fluid consistency: Thin  Diet effective now                     Antimicrobial agents: Anti-infectives (From admission, onward)    Start  Dose/Rate Route Frequency Ordered Stop   03/17/24 0600  piperacillin -tazobactam (ZOSYN ) IVPB 3.375 g  Status:  Discontinued        3.375 g 12.5 mL/hr over 240 Minutes Intravenous Every 8 hours 03/17/24 0018 03/22/24 0949   03/17/24 0030  piperacillin -tazobactam (ZOSYN ) IVPB 3.375 g        3.375 g 100 mL/hr  over 30 Minutes Intravenous  Once 03/17/24 0018 03/17/24 0134        MEDICATIONS: Scheduled Meds:  acetaminophen   1,000 mg Oral Q6H   Or   acetaminophen   650 mg Rectal Q6H   amLODipine   10 mg Oral Daily   bisacodyl   10 mg Rectal Once   carvedilol   3.125 mg Oral BID WC   enoxaparin  (LOVENOX ) injection  40 mg Subcutaneous Q24H   feeding supplement  1 Container Oral TID BM   feeding supplement  237 mL Oral BID BM   hydrALAZINE   75 mg Oral Q8H   irbesartan   300 mg Oral Daily   methocarbamol   1,000 mg Oral QID   pantoprazole   40 mg Oral BID   sodium chloride  flush  3 mL Intravenous Q12H   spironolactone   25 mg Oral Daily   Continuous Infusions:   PRN Meds:.ALPRAZolam , hydrALAZINE , HYDROmorphone  (DILAUDID ) injection, labetalol , ondansetron  **OR** ondansetron  (ZOFRAN ) IV, oxyCODONE , phenol, sodium chloride  flush   I have personally reviewed following labs and imaging studies  LABORATORY DATA: CBC: Recent Labs  Lab 03/20/24 0329 03/21/24 0326 03/23/24 1003 03/26/24 0505  WBC 7.0 5.9 5.6 5.7  NEUTROABS  --   --   --  3.1  HGB 12.5 11.2* 10.5* 10.9*  HCT 39.5 35.6* 34.2* 34.6*  MCV 78.2* 77.6* 79.0* 78.1*  PLT 255 267 288 362    Basic Metabolic Panel: Recent Labs  Lab 03/21/24 0326 03/22/24 0307 03/23/24 0232 03/25/24 0446 03/26/24 0505  NA 140 142 140 138 139  K 3.0* 3.3* 4.3 4.0 3.9  CL 108 112* 113* 107 104  CO2 21* 22 21* 22 22  GLUCOSE 100* 185* 129* 120* 101*  BUN 19 19 21 23  27*  CREATININE 0.81 0.87 0.82 1.04* 0.89  CALCIUM 8.3* 8.6* 8.8* 8.8* 9.0  MG 1.8 1.9 2.0  --  1.9  PHOS  --   --  3.7  --  4.2    GFR: Estimated Creatinine Clearance: 70.2 mL/min (by C-G formula based on SCr of 0.89 mg/dL).  Liver Function Tests: No results for input(s): AST, ALT, ALKPHOS, BILITOT, PROT, ALBUMIN  in the last 168 hours.  No results for input(s): LIPASE, AMYLASE in the last 168 hours.  No results for input(s): AMMONIA in the last 168  hours.  Coagulation Profile: No results for input(s): INR, PROTIME in the last 168 hours.  Cardiac Enzymes: No results for input(s): CKTOTAL, CKMB, CKMBINDEX, TROPONINI in the last 168 hours.  BNP (last 3 results) No results for input(s): PROBNP in the last 8760 hours.  Lipid Profile: No results for input(s): CHOL, HDL, LDLCALC, TRIG, CHOLHDL, LDLDIRECT in the last 72 hours.  Thyroid Function Tests: No results for input(s): TSH, T4TOTAL, FREET4, T3FREE, THYROIDAB in the last 72 hours.   Anemia Panel: No results for input(s): VITAMINB12, FOLATE, FERRITIN, TIBC, IRON, RETICCTPCT in the last 72 hours.  Urine analysis:    Component Value Date/Time   COLORURINE YELLOW 03/16/2024 2359   APPEARANCEUR HAZY (A) 03/16/2024 2359   LABSPEC 1.040 (H) 03/16/2024 2359   PHURINE 6.0 03/16/2024 2359   GLUCOSEU NEGATIVE 03/16/2024 2359  HGBUR SMALL (A) 03/16/2024 2359   BILIRUBINUR NEGATIVE 03/16/2024 2359   KETONESUR NEGATIVE 03/16/2024 2359   PROTEINUR 30 (A) 03/16/2024 2359   UROBILINOGEN 1.0 01/02/2015 0043   NITRITE NEGATIVE 03/16/2024 2359   LEUKOCYTESUR LARGE (A) 03/16/2024 2359    Sepsis Labs: Lactic Acid, Venous    Component Value Date/Time   LATICACIDVEN 2.1 (HH) 03/17/2024 0026    MICROBIOLOGY: Recent Results (from the past 240 hours)  Culture, blood (Routine X 2) w Reflex to ID Panel     Status: None   Collection Time: 03/17/24 12:40 AM   Specimen: BLOOD RIGHT ARM  Result Value Ref Range Status   Specimen Description BLOOD RIGHT ARM  Final   Special Requests   Final    BOTTLES DRAWN AEROBIC AND ANAEROBIC Blood Culture adequate volume   Culture   Final    NO GROWTH 5 DAYS Performed at [facility], [address], [place], [place] [address]    Report Status 03/22/2024 FINAL  Final  Culture, blood (Routine X 2) w Reflex to ID Panel     Status: None   Collection Time: 03/17/24 12:50 AM   Specimen: BLOOD   Result Value Ref Range Status   Specimen Description BLOOD LEFT ANTECUBITAL  Final   Special Requests   Final    BOTTLES DRAWN AEROBIC AND ANAEROBIC Blood Culture adequate volume   Culture   Final    NO GROWTH 5 DAYS Performed at [facility], [address], [place], [place] [address]    Report Status 03/22/2024 FINAL  Final    RADIOLOGY STUDIES/RESULTS: No results found.     LOS: 9 days   Donalda Applebaum, MD  [facility]    To contact the attending provider between 7A-7P or the covering provider during after hours 7P-7A, please log into the web site [email or web address] and access using universal [place] password for that web site. If you do not have the password, please call the hospital operator.  03/26/2024, 9:42 AM

## 2024-03-26 NOTE — Plan of Care (Signed)
  Problem: Education: Goal: Knowledge of General Education information will improve Description: Including pain rating scale, medication(s)/side effects and non-pharmacologic comfort measures Outcome: Progressing   Problem: Clinical Measurements: Goal: Ability to maintain clinical measurements within normal limits will improve Outcome: Progressing Goal: Will remain free from infection Outcome: Progressing   Problem: Activity: Goal: Risk for activity intolerance will decrease Outcome: Progressing   Problem: Pain Managment: Goal: General experience of comfort will improve and/or be controlled Outcome: Progressing   Problem: Safety: Goal: Ability to remain free from injury will improve Outcome: Progressing

## 2024-03-26 NOTE — Progress Notes (Signed)
   Progress Note  9 Days Post-Op  Subjective: Not feeling great this morning, has not had a bowel movement in a couple days and has had some difficulty eating relative to this.   Objective: Vital signs in last 24 hours: Temp:  [97.8 F (36.6 C)-98.7 F (37.1 C)] 98.7 F (37.1 C) (11/29 0800) Pulse Rate:  [59-66] 63 (11/29 0800) Resp:  [12-20] 16 (11/29 0800) BP: (153-185)/(67-86) 179/86 (11/29 0800) SpO2:  [94 %-98 %] 97 % (11/29 0800) Last BM Date : 03/24/24  Intake/Output from previous day: 11/28 0701 - 11/29 0700 In: 6 [I.V.:6] Out: -  Intake/Output this shift: No intake/output data recorded.  PE: Abd: Soft.  Minimally distended today.  Midline and RLQ incisions c/d/I with staples present, no cellulitis or hematoma.  Appropriately tender    Lab Results:  Recent Labs    03/23/24 1003 03/26/24 0505  WBC 5.6 5.7  HGB 10.5* 10.9*  HCT 34.2* 34.6*  PLT 288 362   BMET Recent Labs    03/25/24 0446 03/26/24 0505  NA 138 139  K 4.0 3.9  CL 107 104  CO2 22 22  GLUCOSE 120* 101*  BUN 23 27*  CREATININE 1.04* 0.89  CALCIUM 8.8* 9.0   PT/INR No results for input(s): LABPROT, INR in the last 72 hours. CMP     Component Value Date/Time   NA 139 03/26/2024 0505   K 3.9 03/26/2024 0505   CL 104 03/26/2024 0505   CO2 22 03/26/2024 0505   GLUCOSE 101 (H) 03/26/2024 0505   BUN 27 (H) 03/26/2024 0505   CREATININE 0.89 03/26/2024 0505   CREATININE 0.91 11/06/2022 0148   CALCIUM 9.0 03/26/2024 0505   PROT 5.8 (L) 03/19/2024 0421   ALBUMIN  2.5 (L) 03/19/2024 0421   AST 24 03/19/2024 0421   ALT 25 03/19/2024 0421   ALKPHOS 77 03/19/2024 0421   BILITOT 1.8 (H) 03/19/2024 0421   GFRNONAA >60 03/26/2024 0505   GFRAA 58 (L) 10/18/2019 1255   Lipase     Component Value Date/Time   LIPASE 34 03/16/2024 2215       Studies/Results: No results found.    Anti-infectives: Anti-infectives (From admission, onward)    Start     Dose/Rate Route Frequency  Ordered Stop   03/17/24 0600  piperacillin -tazobactam (ZOSYN ) IVPB 3.375 g  Status:  Discontinued        3.375 g 12.5 mL/hr over 240 Minutes Intravenous Every 8 hours 03/17/24 0018 03/22/24 0949   03/17/24 0030  piperacillin -tazobactam (ZOSYN ) IVPB 3.375 g        3.375 g 100 mL/hr over 30 Minutes Intravenous  Once 03/17/24 0018 03/17/24 0134        Assessment/Plan POD7: LAPAROTOMY, EXPLORATORY, SMALL BOWEL RESECTION, WOUND EXPLORATION (N/A) - Repair of incarcerated Incisional hernia by Dr. Richerd Silversmith on 11/20 - Continue soft diet - encouraged ambulation  - Multi-modal pain control: schedule tylenol , increase robaxin  to 1,000 mg QID, oxy to 5-10 mg q 4h PRN.  - Keep K >4.0, Mg >2.0 and Phos >3.0 to optimize bowel function  - Try suppository today and if she does have a bowel movement, okay for discharge home from surgery standpoint.   FEN: soft, SLIV VTE: LMWH ID: [email or web address]  11/20>11/25    LOS: 9 days    Mitzie DELENA Freund, MD  [facility] 03/26/2024, 9:21 AM Please see Amion for pager number during day hours 7:00am-4:30pm

## 2024-03-27 ENCOUNTER — Other Ambulatory Visit (HOSPITAL_COMMUNITY): Payer: Self-pay

## 2024-03-27 DIAGNOSIS — K460 Unspecified abdominal hernia with obstruction, without gangrene: Secondary | ICD-10-CM | POA: Diagnosis not present

## 2024-03-27 LAB — BASIC METABOLIC PANEL WITH GFR
Anion gap: 10 (ref 5–15)
BUN: 23 mg/dL (ref 8–23)
CO2: 23 mmol/L (ref 22–32)
Calcium: 8.9 mg/dL (ref 8.9–10.3)
Chloride: 105 mmol/L (ref 98–111)
Creatinine, Ser: 0.87 mg/dL (ref 0.44–1.00)
GFR, Estimated: >60 mL/min (ref >60)
Glucose, Bld: 95 mg/dL (ref 70–99)
Potassium: 4.1 mmol/L (ref 3.5–5.1)
Sodium: 138 mmol/L (ref 135–145)

## 2024-03-27 MED ORDER — POLYETHYLENE GLYCOL 3350 17 G PO PACK
17.0000 g | PACK | Freq: Two times a day (BID) | ORAL | Status: DC
  Administered 2024-03-27 – 2024-03-30 (×7): 17 g via ORAL
  Filled 2024-03-27 (×8): qty 1

## 2024-03-27 MED ORDER — METHOCARBAMOL 500 MG PO TABS: 1000.0000 mg | ORAL_TABLET | Freq: Four times a day (QID) | ORAL | Status: DC | PRN

## 2024-03-27 MED ORDER — HYDROMORPHONE HCL 1 MG/ML IJ SOLN: 0.5000 mg | Freq: Four times a day (QID) | INTRAMUSCULAR | Status: DC | PRN

## 2024-03-27 MED ORDER — ACETAMINOPHEN 500 MG PO TABS
1000.0000 mg | ORAL_TABLET | Freq: Four times a day (QID) | ORAL | Status: DC | PRN
  Administered 2024-03-29: 1000 mg via ORAL
  Filled 2024-03-27: qty 2

## 2024-03-27 MED ORDER — SMOG ENEMA: 960.0000 mL | Freq: Once | RECTAL | Status: DC | PRN

## 2024-03-27 MED ORDER — OXYCODONE HCL 5 MG PO TABS
5.0000 mg | ORAL_TABLET | Freq: Four times a day (QID) | ORAL | Status: DC | PRN
  Administered 2024-03-27 – 2024-03-30 (×9): 5 mg via ORAL
  Filled 2024-03-27 (×9): qty 1

## 2024-03-27 NOTE — Progress Notes (Addendum)
 PROGRESS NOTE        PATIENT DETAILS Name: Denise Hudson Age: 63 y.o. Sex: female Date of Birth: Aug 17, 1960 Admit Date: 03/16/2024 Admitting Physician Micaela Speaker, MD PCP:Pcp, No  Brief Summary: Patient is a 63 y.o.  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  Significant events: 11/20>> admit to [facility]  Significant studies: 11/19>> CT abdomen/pelvis: SBO-transition zone seen as a loop of small bowel enters lateral  ventral hernia. 11/20>> echo: EF 60-65%  Significant microbiology data: 11/20>> blood culture: No growth  Procedures: 11/20>> expiratory laparotomy with small bowel resection.  Consults: General Surgery Cards  Subjective: Patient in bed appears to be in no distress, no fevers no chest pain, no abdominal pain or nausea but feels bloated and distended, had a very small BM on 03/26/2024.  Objective: Vitals: Blood pressure (!) 151/77, pulse 65, temperature 98.3 F (36.8 C), temperature source Oral, resp. rate 12, height 6' 1 (1.854 m), weight 67 kg, last menstrual period 09/23/2015, SpO2 95%.   Exam:  Awake Alert, No new F.N deficits, Normal affect [place].AT,PERRAL Supple Neck, No JVD,   Symmetrical Chest wall movement, Good air movement bilaterally, CTAB RRR,No Gallops, Rubs or new Murmurs,  +ve B.Sounds, Abd Soft, minimal postop tenderness No Cyanosis, Clubbing or edema    Assessment/Plan: SBO secondary to incarcerated incisional hernia S/p exploratory laparotomy/bowel resection by general surgery on 11/20 Slowly improving NG tube/Foley discontinued 11/22 Had BM 11/26 Tolerating soft diet Still feeling bloated and distended, very small bowel movement on 03/26/2024,  defer management to general surgery received a suppository on 03/26/2024, she was encouraged to sit in chair in the daytime and frequently ambulate in the hallway.  Paroxysmal supraventricular tachycardia Continues to have episodes of PSVT Previously on metoprolol -previous attempts at increasing doses of metoprolol  have resulted in bradycardia in the 40s Cardiology now following-has been switched from metoprolol  to low-dose Coreg -which she seems to be tolerating fine-with stable heart rate. Recent TSH/echo stable.    Hypertensive urgency BP continues to fluctuate quite a bit-somewhat on the higher side Continue amlodipine /Coreg /Avapro /Aldactone /hydralazine .  AKI Mild Likely hemodynamically mediated Has resolved with supportive care.  Hypokalemia Repleted.  Code status:   Code Status: Full Code   DVT Prophylaxis: enoxaparin  (LOVENOX ) injection 40 mg Start: 03/18/24 1630 SCDs Start: 03/17/24 0023 Place TED hose Start: 03/17/24 0023   Family Communication: None at bedside   Disposition Plan: Status is: Inpatient Remains inpatient appropriate because: Severity of illness   Planned Discharge Destination:Home   Diet: Diet Order             DIET SOFT Room service appropriate? Yes; Fluid consistency: Thin  Diet effective now                     Antimicrobial agents: Anti-infectives (From admission, onward)    Start     Dose/Rate Route Frequency Ordered Stop   03/17/24 0600  piperacillin -tazobactam (ZOSYN ) IVPB 3.375 g  Status:  Discontinued        3.375 g 12.5 mL/hr over 240 Minutes Intravenous Every 8 hours 03/17/24 0018 03/22/24 0949   03/17/24 0030  piperacillin -tazobactam (ZOSYN ) IVPB 3.375 g  3.375 g 100 mL/hr over 30 Minutes Intravenous  Once 03/17/24 0018 03/17/24 0134        MEDICATIONS: Scheduled Meds:  acetaminophen   1,000 mg Oral Q6H   Or   acetaminophen   650 mg Rectal Q6H   amLODipine   10 mg Oral Daily   carvedilol   3.125 mg Oral BID  WC   enoxaparin  (LOVENOX ) injection  40 mg Subcutaneous Q24H   feeding supplement  1 Container Oral TID BM   feeding supplement  237 mL Oral BID BM   hydrALAZINE   75 mg Oral Q8H   irbesartan   300 mg Oral Daily   methocarbamol   1,000 mg Oral QID   pantoprazole   40 mg Oral BID   sodium chloride  flush  3 mL Intravenous Q12H   spironolactone   25 mg Oral Daily   Continuous Infusions:   PRN Meds:.ALPRAZolam , hydrALAZINE , HYDROmorphone  (DILAUDID ) injection, labetalol , ondansetron  **OR** ondansetron  (ZOFRAN ) IV, oxyCODONE , phenol, sodium chloride  flush   I have personally reviewed following labs and imaging studies  LABORATORY DATA: CBC: Recent Labs  Lab 03/21/24 0326 03/23/24 1003 03/26/24 0505  WBC 5.9 5.6 5.7  NEUTROABS  --   --  3.1  HGB 11.2* 10.5* 10.9*  HCT 35.6* 34.2* 34.6*  MCV 77.6* 79.0* 78.1*  PLT 267 288 362    Basic Metabolic Panel: Recent Labs  Lab 03/21/24 0326 03/22/24 0307 03/23/24 0232 03/25/24 0446 03/26/24 0505 03/27/24 0339  NA 140 142 140 138 139 138  K 3.0* 3.3* 4.3 4.0 3.9 4.1  CL 108 112* 113* 107 104 105  CO2 21* 22 21* 22 22 23   GLUCOSE 100* 185* 129* 120* 101* 95  BUN 19 19 21 23  27* 23  CREATININE 0.81 0.87 0.82 1.04* 0.89 0.87  CALCIUM 8.3* 8.6* 8.8* 8.8* 9.0 8.9  MG 1.8 1.9 2.0  --  1.9  --   PHOS  --   --  3.7  --  4.2  --     GFR: Estimated Creatinine Clearance: 71.8 mL/min (by C-G formula based on SCr of 0.87 mg/dL).  Liver Function Tests: No results for input(s): AST, ALT, ALKPHOS, BILITOT, PROT, ALBUMIN  in the last 168 hours.  No results for input(s): LIPASE, AMYLASE in the last 168 hours.  No results for input(s): AMMONIA in the last 168 hours.  Coagulation Profile: No results for input(s): INR, PROTIME in the last 168 hours.  Cardiac Enzymes: No results for input(s): CKTOTAL, CKMB, CKMBINDEX, TROPONINI in the last 168 hours.  BNP (last 3 results) No results for input(s): PROBNP in  the last 8760 hours.  Lipid Profile: No results for input(s): CHOL, HDL, LDLCALC, TRIG, CHOLHDL, LDLDIRECT in the last 72 hours.  Thyroid Function Tests: No results for input(s): TSH, T4TOTAL, FREET4, T3FREE, THYROIDAB in the last 72 hours.   Anemia Panel: No results for input(s): VITAMINB12, FOLATE, FERRITIN, TIBC, IRON, RETICCTPCT in the last 72 hours.  Urine analysis:    Component Value Date/Time   COLORURINE YELLOW 03/16/2024 2359   APPEARANCEUR HAZY (A) 03/16/2024 2359   LABSPEC 1.040 (H) 03/16/2024 2359   PHURINE 6.0 03/16/2024 2359   GLUCOSEU NEGATIVE 03/16/2024 2359   HGBUR SMALL (A) 03/16/2024 2359   BILIRUBINUR NEGATIVE 03/16/2024 2359   KETONESUR NEGATIVE 03/16/2024 2359   PROTEINUR 30 (A) 03/16/2024 2359   UROBILINOGEN 1.0 01/02/2015 0043   NITRITE NEGATIVE 03/16/2024 2359   LEUKOCYTESUR LARGE (A) 03/16/2024 2359    Sepsis Labs: Lactic Acid, Venous    Component Value Date/Time   LATICACIDVEN  2.1 (HH) 03/17/2024 0026    MICROBIOLOGY: No results found for this or any previous visit (from the past 240 hours).   RADIOLOGY STUDIES/RESULTS: No results found.     LOS: 10 days   Lavada Stank, MD  [facility]    To contact the attending provider between 7A-7P or the covering provider during after hours 7P-7A, please log into the web site [email or web address] and access using universal [place] password for that web site. If you do not have the password, please call the hospital operator.  03/27/2024, 8:32 AM

## 2024-03-27 NOTE — Progress Notes (Signed)
   Progress Note  10 Days Post-Op  Subjective: Minimal bowel movement despite suppository yesterday, reports stool is hard and she is avoiding straining, appropriately.  Feels bloated and having a hard time eating.   Objective: Vital signs in last 24 hours: Temp:  [97.9 F (36.6 C)-98.3 F (36.8 C)] 98.3 F (36.8 C) (11/30 0325) Pulse Rate:  [58-65] 65 (11/30 0618) Resp:  [12-17] 12 (11/30 0618) BP: (151-167)/(73-80) 151/77 (11/30 0618) SpO2:  [95 %-97 %] 95 % (11/30 0325) Last BM Date : 03/24/24  Intake/Output from previous day: No intake/output data recorded. Intake/Output this shift: No intake/output data recorded.  PE: Abd: Soft.  Mildly distended today.  Midline and RLQ incisions c/d/I with staples present, no cellulitis or hematoma.  Appropriately tender    Lab Results:  Recent Labs    03/26/24 0505  WBC 5.7  HGB 10.9*  HCT 34.6*  PLT 362   BMET Recent Labs    03/26/24 0505 03/27/24 0339  NA 139 138  K 3.9 4.1  CL 104 105  CO2 22 23  GLUCOSE 101* 95  BUN 27* 23  CREATININE 0.89 0.87  CALCIUM 9.0 8.9   PT/INR No results for input(s): LABPROT, INR in the last 72 hours. CMP     Component Value Date/Time   NA 138 03/27/2024 0339   K 4.1 03/27/2024 0339   CL 105 03/27/2024 0339   CO2 23 03/27/2024 0339   GLUCOSE 95 03/27/2024 0339   BUN 23 03/27/2024 0339   CREATININE 0.87 03/27/2024 0339   CREATININE 0.91 11/06/2022 0148   CALCIUM 8.9 03/27/2024 0339   PROT 5.8 (L) 03/19/2024 0421   ALBUMIN  2.5 (L) 03/19/2024 0421   AST 24 03/19/2024 0421   ALT 25 03/19/2024 0421   ALKPHOS 77 03/19/2024 0421   BILITOT 1.8 (H) 03/19/2024 0421   GFRNONAA >60 03/27/2024 0339   GFRAA 58 (L) 10/18/2019 1255   Lipase     Component Value Date/Time   LIPASE 34 03/16/2024 2215       Studies/Results: No results found.    Anti-infectives: Anti-infectives (From admission, onward)    Start     Dose/Rate Route Frequency Ordered Stop   03/17/24 0600   piperacillin -tazobactam (ZOSYN ) IVPB 3.375 g  Status:  Discontinued        3.375 g 12.5 mL/hr over 240 Minutes Intravenous Every 8 hours 03/17/24 0018 03/22/24 0949   03/17/24 0030  piperacillin -tazobactam (ZOSYN ) IVPB 3.375 g        3.375 g 100 mL/hr over 30 Minutes Intravenous  Once 03/17/24 0018 03/17/24 0134        Assessment/Plan POD10: LAPAROTOMY, EXPLORATORY, SMALL BOWEL RESECTION, WOUND EXPLORATION (N/A) - Repair of incarcerated Incisional hernia by Dr. Richerd Silversmith on 11/20 - Regular diet - Smog enema ordered, patient will decide if she wants to do this.  Given she is bloated and uncomfortable, I do think it would be best if she is able to have another bowel movement before going home.   FEN: Regular VTE: LMWH ID: [email or web address]  11/20>11/25    LOS: 10 days    Denise DELENA Freund, MD  [facility] 03/27/2024, 9:57 AM Please see Amion for pager number during day hours 7:00am-4:30pm

## 2024-03-28 ENCOUNTER — Other Ambulatory Visit (HOSPITAL_COMMUNITY): Payer: Self-pay

## 2024-03-28 ENCOUNTER — Inpatient Hospital Stay (HOSPITAL_COMMUNITY)

## 2024-03-28 DIAGNOSIS — K460 Unspecified abdominal hernia with obstruction, without gangrene: Secondary | ICD-10-CM | POA: Diagnosis not present

## 2024-03-28 LAB — CBC WITH DIFFERENTIAL/PLATELET
Abs Immature Granulocytes: 0.08 K/uL — ABNORMAL HIGH (ref 0.00–0.07)
Basophils Absolute: 0.1 K/uL (ref 0.0–0.1)
Basophils Relative: 1 %
Eosinophils Absolute: 0.2 K/uL (ref 0.0–0.5)
Eosinophils Relative: 3 %
HCT: 35.3 % — ABNORMAL LOW (ref 36.0–46.0)
Hemoglobin: 11.1 g/dL — ABNORMAL LOW (ref 12.0–15.0)
Immature Granulocytes: 1 %
Lymphocytes Relative: 29 %
Lymphs Abs: 1.7 K/uL (ref 0.7–4.0)
MCH: 24.4 pg — ABNORMAL LOW (ref 26.0–34.0)
MCHC: 31.4 g/dL (ref 30.0–36.0)
MCV: 77.6 fL — ABNORMAL LOW (ref 80.0–100.0)
Monocytes Absolute: 0.5 K/uL (ref 0.1–1.0)
Monocytes Relative: 9 %
Neutro Abs: 3.3 K/uL (ref 1.7–7.7)
Neutrophils Relative %: 57 %
Platelets: 441 K/uL — ABNORMAL HIGH (ref 150–400)
RBC: 4.55 MIL/uL (ref 3.87–5.11)
RDW: 14.9 % (ref 11.5–15.5)
WBC: 5.8 K/uL (ref 4.0–10.5)
nRBC: 0 % (ref 0.0–0.2)

## 2024-03-28 LAB — BASIC METABOLIC PANEL WITH GFR
Anion gap: 10 (ref 5–15)
BUN: 25 mg/dL — ABNORMAL HIGH (ref 8–23)
CO2: 23 mmol/L (ref 22–32)
Calcium: 9 mg/dL (ref 8.9–10.3)
Chloride: 104 mmol/L (ref 98–111)
Creatinine, Ser: 0.9 mg/dL (ref 0.44–1.00)
GFR, Estimated: >60 mL/min (ref >60)
Glucose, Bld: 97 mg/dL (ref 70–99)
Potassium: 4.2 mmol/L (ref 3.5–5.1)
Sodium: 137 mmol/L (ref 135–145)

## 2024-03-28 LAB — MAGNESIUM: Magnesium: 2.1 mg/dL (ref 1.7–2.4)

## 2024-03-28 LAB — PHOSPHORUS: Phosphorus: 4.2 mg/dL (ref 2.5–4.6)

## 2024-03-28 MED ORDER — FAMOTIDINE 20 MG PO TABS
20.0000 mg | ORAL_TABLET | Freq: Two times a day (BID) | ORAL | Status: DC
  Administered 2024-03-28 – 2024-03-30 (×4): 20 mg via ORAL
  Filled 2024-03-28 (×4): qty 1

## 2024-03-28 NOTE — TOC Progression Note (Signed)
 Transition of Care [facility]) - Progression Note    Patient Details  Name: Denise Hudson MRN: [id number] Date of Birth: [birth year]  [facility] of [facility]) CM/SW Contact  Andrez JULIANNA George, RN Phone Number: 03/28/2024, 3:08 PM  Clinical Narrative:     Plan is for d/c tomorrow. No current IP Care management needs. CM will follow.    Barriers to Discharge: Continued Medical Work up               Expected Discharge Plan and Services                                               Social Drivers of Health (SDOH) Interventions SDOH Screenings   Food Insecurity: No Food Insecurity (03/17/2024)  Housing: Low Risk  (03/17/2024)  Transportation Needs: No Transportation Needs (03/17/2024)  Utilities: Not At Risk (03/17/2024)  Depression (PHQ2-9): Low Risk  (12/11/2022)  Tobacco Use: Medium Risk (03/17/2024)    Readmission Risk Interventions     No data to display

## 2024-03-28 NOTE — Progress Notes (Signed)
 PROGRESS NOTE        PATIENT DETAILS Name: Denise Hudson Age: 63 y.o. Sex: female Date of Birth: Oct 10, 1960 Admit Date: 03/16/2024 Admitting Physician Micaela Speaker, MD PCP:Pcp, No  Brief Summary: Patient is a 63 y.o.  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  Significant events: 11/20>> admit to [facility]  Significant studies: 11/19>> CT abdomen/pelvis: SBO-transition zone seen as a loop of small bowel enters lateral  ventral hernia. 11/20>> echo: EF 60-65%  Significant microbiology data: 11/20>> blood culture: No growth  Procedures: 11/20>> expiratory laparotomy with small bowel resection.  Consults: General Surgery Cards  Subjective: Patient in bed, appears comfortable, denies any headache, no fever, no chest pain or pressure, no shortness of breath , no abdominal pain but still feels bloated, had a BM this morning.  Objective: Vitals: Blood pressure (!) 159/68, pulse 63, temperature 98.7 F (37.1 C), temperature source Oral, resp. rate 17, height 6' 1 (1.854 m), weight 67 kg, last menstrual period 09/23/2015, SpO2 98%.   Exam:  Awake Alert, No new F.N deficits, Normal affect [place].AT,PERRAL Supple Neck, No JVD,   Symmetrical Chest wall movement, Good air movement bilaterally, CTAB RRR,No Gallops, Rubs or new Murmurs,  +ve B.Sounds, Abd Soft, minimal postop tenderness No Cyanosis, Clubbing or edema    Assessment/Plan: SBO secondary to incarcerated incisional hernia S/p exploratory laparotomy/bowel resection by general surgery on 11/20 Slowly improving NG tube/Foley discontinued 11/22 Had BM 11/26 Tolerating soft diet Still feeling bloated and distended, had a BM morning of  03/28/2024, per surgery check a KUB, again advised patient to stay in chair in the daytime and frequently ambulate in the hallway.  Paroxysmal supraventricular tachycardia Continues to have episodes of PSVT Previously on metoprolol -previous attempts at increasing doses of metoprolol  have resulted in bradycardia in the 40s Cardiology now following-has been switched from metoprolol  to low-dose Coreg -which she seems to be tolerating fine-with stable heart rate. Recent TSH/echo stable.    Hypertensive urgency BP continues to fluctuate quite a bit-somewhat on the higher side Continue amlodipine /Coreg /Avapro /Aldactone /hydralazine .  AKI Mild Likely hemodynamically mediated Has resolved with supportive care.  Hypokalemia Repleted.  Code status:   Code Status: Full Code   DVT Prophylaxis: enoxaparin  (LOVENOX ) injection 40 mg Start: 03/18/24 1630 SCDs Start: 03/17/24 0023 Place TED hose Start: 03/17/24 0023   Family Communication: None at bedside   Disposition Plan: Status is: Inpatient Remains inpatient appropriate because: Severity of illness   Planned Discharge Destination:Home   Diet: Diet Order             Diet regular Fluid consistency: Thin  Diet effective now                     Antimicrobial agents: Anti-infectives (From admission, onward)    Start     Dose/Rate Route Frequency Ordered Stop   03/17/24 0600  piperacillin -tazobactam (ZOSYN ) IVPB 3.375 g  Status:  Discontinued        3.375 g 12.5 mL/hr over 240 Minutes Intravenous Every 8 hours 03/17/24 0018 03/22/24 0949   03/17/24 0030  piperacillin -tazobactam (ZOSYN ) IVPB 3.375 g        3.375 g 100 mL/hr over 30  Minutes Intravenous  Once 03/17/24 0018 03/17/24 0134        MEDICATIONS: Scheduled Meds:  amLODipine   10 mg Oral Daily   carvedilol   3.125 mg Oral BID WC   enoxaparin  (LOVENOX ) injection  40 mg Subcutaneous Q24H   feeding supplement  1 Container Oral TID BM   feeding supplement  237 mL  Oral BID BM   hydrALAZINE   75 mg Oral Q8H   irbesartan   300 mg Oral Daily   pantoprazole   40 mg Oral BID   polyethylene glycol  17 g Oral BID   sodium chloride  flush  3 mL Intravenous Q12H   spironolactone   25 mg Oral Daily   Continuous Infusions:   PRN Meds:.acetaminophen  **OR** [DISCONTINUED] acetaminophen , ALPRAZolam , hydrALAZINE , HYDROmorphone  (DILAUDID ) injection, labetalol , methocarbamol , ondansetron  **OR** ondansetron  (ZOFRAN ) IV, oxyCODONE , phenol, sodium chloride  flush, SMOG   I have personally reviewed following labs and imaging studies  LABORATORY DATA: CBC: Recent Labs  Lab 03/23/24 1003 03/26/24 0505 03/28/24 0335  WBC 5.6 5.7 5.8  NEUTROABS  --  3.1 3.3  HGB 10.5* 10.9* 11.1*  HCT 34.2* 34.6* 35.3*  MCV 79.0* 78.1* 77.6*  PLT 288 362 441*    Basic Metabolic Panel: Recent Labs  Lab 03/22/24 0307 03/23/24 0232 03/25/24 0446 03/26/24 0505 03/27/24 0339 03/28/24 0335  NA 142 140 138 139 138 137  K 3.3* 4.3 4.0 3.9 4.1 4.2  CL 112* 113* 107 104 105 104  CO2 22 21* 22 22 23 23   GLUCOSE 185* 129* 120* 101* 95 97  BUN 19 21 23  27* 23 25*  CREATININE 0.87 0.82 1.04* 0.89 0.87 0.90  CALCIUM 8.6* 8.8* 8.8* 9.0 8.9 9.0  MG 1.9 2.0  --  1.9  --  2.1  PHOS  --  3.7  --  4.2  --  4.2    GFR: Estimated Creatinine Clearance: 69.4 mL/min (by C-G formula based on SCr of 0.9 mg/dL).  Liver Function Tests: No results for input(s): AST, ALT, ALKPHOS, BILITOT, PROT, ALBUMIN  in the last 168 hours.  No results for input(s): LIPASE, AMYLASE in the last 168 hours.  No results for input(s): AMMONIA in the last 168 hours.  Coagulation Profile: No results for input(s): INR, PROTIME in the last 168 hours.  Cardiac Enzymes: No results for input(s): CKTOTAL, CKMB, CKMBINDEX, TROPONINI in the last 168 hours.  BNP (last 3 results) No results for input(s): PROBNP in the last 8760 hours.  Lipid Profile: No results for input(s): CHOL,  HDL, LDLCALC, TRIG, CHOLHDL, LDLDIRECT in the last 72 hours.  Thyroid Function Tests: No results for input(s): TSH, T4TOTAL, FREET4, T3FREE, THYROIDAB in the last 72 hours.   Anemia Panel: No results for input(s): VITAMINB12, FOLATE, FERRITIN, TIBC, IRON, RETICCTPCT in the last 72 hours.  Urine analysis:    Component Value Date/Time   COLORURINE YELLOW 03/16/2024 2359   APPEARANCEUR HAZY (A) 03/16/2024 2359   LABSPEC 1.040 (H) 03/16/2024 2359   PHURINE 6.0 03/16/2024 2359   GLUCOSEU NEGATIVE 03/16/2024 2359   HGBUR SMALL (A) 03/16/2024 2359   BILIRUBINUR NEGATIVE 03/16/2024 2359   KETONESUR NEGATIVE 03/16/2024 2359   PROTEINUR 30 (A) 03/16/2024 2359   UROBILINOGEN 1.0 01/02/2015 0043   NITRITE NEGATIVE 03/16/2024 2359   LEUKOCYTESUR LARGE (A) 03/16/2024 2359    Sepsis Labs: Lactic Acid, Venous    Component Value Date/Time   LATICACIDVEN 2.1 (HH) 03/17/2024 0026    MICROBIOLOGY: No results found for this or any previous visit (from the past 240 hours).  RADIOLOGY STUDIES/RESULTS: No results found.     LOS: 11 days   Denise Stank, MD  [facility]    To contact the attending provider between 7A-7P or the covering provider during after hours 7P-7A, please log into the web site [email or web address] and access using universal [place] password for that web site. If you do not have the password, please call the hospital operator.  03/28/2024, 9:28 AM

## 2024-03-28 NOTE — Plan of Care (Signed)

## 2024-03-28 NOTE — TOC CM/SW Note (Signed)
 03-28-2025 [place]   E. @ [facility] :  CONTACT FOR D/C PLANNING NEED AND UPDATE AND HELP WITH H H , DME. AND SNF PHONE # [phone] X [phone]

## 2024-03-28 NOTE — Progress Notes (Signed)
   Progress Note  11 Days Post-Op  Subjective: Pt reports large BM this AM but still feels a little bloated and unable to take in much. Feels like indigestion and bloating in upper abdomen mostly. Not passing a lot of flatus but not overtly nauseated or vomiting either.   Objective: Vital signs in last 24 hours: Temp:  [98 F (36.7 C)-99.1 F (37.3 C)] 98.7 F (37.1 C) (12/01 0803) Pulse Rate:  [62-69] 63 (12/01 0803) Resp:  [12-20] 17 (12/01 0803) BP: (143-159)/(68-76) 159/68 (12/01 0803) SpO2:  [96 %-98 %] 98 % (12/01 0549) Last BM Date : 03/28/24  Intake/Output from previous day: No intake/output data recorded. Intake/Output this shift: No intake/output data recorded.  PE: Abd: Soft.  Mildly distended today.  Midline and RLQ incisions c/d/I with staples present, no cellulitis or hematoma.  Appropriately tender    Lab Results:  Recent Labs    03/26/24 0505 03/28/24 0335  WBC 5.7 5.8  HGB 10.9* 11.1*  HCT 34.6* 35.3*  PLT 362 441*   BMET Recent Labs    03/27/24 0339 03/28/24 0335  NA 138 137  K 4.1 4.2  CL 105 104  CO2 23 23  GLUCOSE 95 97  BUN 23 25*  CREATININE 0.87 0.90  CALCIUM 8.9 9.0   PT/INR No results for input(s): LABPROT, INR in the last 72 hours. CMP     Component Value Date/Time   NA 137 03/28/2024 0335   K 4.2 03/28/2024 0335   CL 104 03/28/2024 0335   CO2 23 03/28/2024 0335   GLUCOSE 97 03/28/2024 0335   BUN 25 (H) 03/28/2024 0335   CREATININE 0.90 03/28/2024 0335   CREATININE 0.91 11/06/2022 0148   CALCIUM 9.0 03/28/2024 0335   PROT 5.8 (L) 03/19/2024 0421   ALBUMIN  2.5 (L) 03/19/2024 0421   AST 24 03/19/2024 0421   ALT 25 03/19/2024 0421   ALKPHOS 77 03/19/2024 0421   BILITOT 1.8 (H) 03/19/2024 0421   GFRNONAA >60 03/28/2024 0335   GFRAA 58 (L) 10/18/2019 1255   Lipase     Component Value Date/Time   LIPASE 34 03/16/2024 2215       Studies/Results: No results found.  Anti-infectives: Anti-infectives (From  admission, onward)    Start     Dose/Rate Route Frequency Ordered Stop   03/17/24 0600  piperacillin -tazobactam (ZOSYN ) IVPB 3.375 g  Status:  Discontinued        3.375 g 12.5 mL/hr over 240 Minutes Intravenous Every 8 hours 03/17/24 0018 03/22/24 0949   03/17/24 0030  piperacillin -tazobactam (ZOSYN ) IVPB 3.375 g        3.375 g 100 mL/hr over 30 Minutes Intravenous  Once 03/17/24 0018 03/17/24 0134        Assessment/Plan  POD11 s/p ex-lap with small bowel resection and wound exploration, repair of RIH by Dr. Ann  - having more bowel function but still feeling bloated and unable to take much PO   - check KUB this AM and make sure small bowel and stomach are not overly distended - if film looks reassuring then ok to DC from surgical standpoint - staple removal arranged in our office for 12/4 and surgical follow up in [facility] already as well  FEN: reg diet VTE: LMWH ID: [email or web address]  11/20>11/25    LOS: 11 days    Burnard JONELLE Louder, [facility] Surgery 03/28/2024, 9:47 AM Please see Amion for pager number during day hours 7:00am-4:30pm

## 2024-03-29 ENCOUNTER — Other Ambulatory Visit (HOSPITAL_COMMUNITY): Payer: Self-pay

## 2024-03-29 ENCOUNTER — Inpatient Hospital Stay

## 2024-03-29 ENCOUNTER — Inpatient Hospital Stay (HOSPITAL_COMMUNITY)

## 2024-03-29 DIAGNOSIS — K460 Unspecified abdominal hernia with obstruction, without gangrene: Secondary | ICD-10-CM | POA: Diagnosis not present

## 2024-03-29 LAB — CBC WITH DIFFERENTIAL/PLATELET
Abs Immature Granulocytes: 0.07 K/uL (ref 0.00–0.07)
Basophils Absolute: 0.1 K/uL (ref 0.0–0.1)
Basophils Relative: 2 %
Eosinophils Absolute: 0.2 K/uL (ref 0.0–0.5)
Eosinophils Relative: 3 %
HCT: 34.1 % — ABNORMAL LOW (ref 36.0–46.0)
Hemoglobin: 10.7 g/dL — ABNORMAL LOW (ref 12.0–15.0)
Immature Granulocytes: 1 %
Lymphocytes Relative: 28 %
Lymphs Abs: 1.8 K/uL (ref 0.7–4.0)
MCH: 24.4 pg — ABNORMAL LOW (ref 26.0–34.0)
MCHC: 31.4 g/dL (ref 30.0–36.0)
MCV: 77.9 fL — ABNORMAL LOW (ref 80.0–100.0)
Monocytes Absolute: 0.5 K/uL (ref 0.1–1.0)
Monocytes Relative: 8 %
Neutro Abs: 3.6 K/uL (ref 1.7–7.7)
Neutrophils Relative %: 58 %
Platelets: 494 K/uL — ABNORMAL HIGH (ref 150–400)
RBC: 4.38 MIL/uL (ref 3.87–5.11)
RDW: 15.1 % (ref 11.5–15.5)
WBC: 6.2 K/uL (ref 4.0–10.5)
nRBC: 0 % (ref 0.0–0.2)

## 2024-03-29 LAB — BASIC METABOLIC PANEL WITH GFR
Anion gap: 9 (ref 5–15)
BUN: 38 mg/dL — ABNORMAL HIGH (ref 8–23)
CO2: 22 mmol/L (ref 22–32)
Calcium: 9.3 mg/dL (ref 8.9–10.3)
Chloride: 108 mmol/L (ref 98–111)
Creatinine, Ser: 1.08 mg/dL — ABNORMAL HIGH (ref 0.44–1.00)
GFR, Estimated: 58 mL/min — ABNORMAL LOW (ref >60)
Glucose, Bld: 102 mg/dL — ABNORMAL HIGH (ref 70–99)
Potassium: 4.7 mmol/L (ref 3.5–5.1)
Sodium: 139 mmol/L (ref 135–145)

## 2024-03-29 LAB — PHOSPHORUS: Phosphorus: 5 mg/dL — ABNORMAL HIGH (ref 2.5–4.6)

## 2024-03-29 LAB — MAGNESIUM: Magnesium: 2.1 mg/dL (ref 1.7–2.4)

## 2024-03-29 MED ORDER — MAGNESIUM HYDROXIDE 400 MG/5ML PO SUSP
30.0000 mL | Freq: Two times a day (BID) | ORAL | Status: AC
  Administered 2024-03-29 (×2): 30 mL via ORAL
  Filled 2024-03-29 (×2): qty 30

## 2024-03-29 MED ORDER — DOCUSATE SODIUM 100 MG PO CAPS
200.0000 mg | ORAL_CAPSULE | Freq: Two times a day (BID) | ORAL | Status: DC
  Administered 2024-03-29 – 2024-03-30 (×3): 200 mg via ORAL
  Filled 2024-03-29 (×3): qty 2

## 2024-03-29 NOTE — Plan of Care (Signed)
   Problem: Education: Goal: Knowledge of General Education information will improve Description Including pain rating scale, medication(s)/side effects and non-pharmacologic comfort measures Outcome: Progressing

## 2024-03-29 NOTE — Plan of Care (Signed)
  Problem: Education: Goal: Knowledge of General Education information will improve Description: Including pain rating scale, medication(s)/side effects and non-pharmacologic comfort measures Outcome: Progressing   Problem: Clinical Measurements: Goal: Ability to maintain clinical measurements within normal limits will improve Outcome: Progressing Goal: Will remain free from infection Outcome: Progressing Goal: Respiratory complications will improve Outcome: Progressing   Problem: Elimination: Goal: Will not experience complications related to urinary retention Outcome: Progressing

## 2024-03-29 NOTE — Progress Notes (Signed)
 Progress Note  12 Days Post-Op  Subjective: BM yesterday.  Some flatus.  Minimal nausea yesterday.  Eating breakfast currently with no issues.    Objective: Vital signs in last 24 hours: Temp:  [97.7 F (36.5 C)-98.5 F (36.9 C)] 97.7 F (36.5 C) (12/02 0809) Pulse Rate:  [51-72] 59 (12/02 0809) Resp:  [11-19] 11 (12/02 0423) BP: (132-144)/(59-76) 141/76 (12/02 0809) SpO2:  [93 %-98 %] 97 % (12/02 0809) Last BM Date : 03/28/24  Intake/Output from previous day: No intake/output data recorded. Intake/Output this shift: No intake/output data recorded.  PE: Abd: Soft.  ND.  Midline and RLQ incisions c/d/I with staples present, no cellulitis or hematoma.  Appropriately tender    Lab Results:  Recent Labs    03/28/24 0335 03/29/24 0352  WBC 5.8 6.2  HGB 11.1* 10.7*  HCT 35.3* 34.1*  PLT 441* 494*   BMET Recent Labs    03/28/24 0335 03/29/24 0352  NA 137 139  K 4.2 4.7  CL 104 108  CO2 23 22  GLUCOSE 97 102*  BUN 25* 38*  CREATININE 0.90 1.08*  CALCIUM 9.0 9.3   PT/INR No results for input(s): LABPROT, INR in the last 72 hours. CMP     Component Value Date/Time   NA 139 03/29/2024 0352   K 4.7 03/29/2024 0352   CL 108 03/29/2024 0352   CO2 22 03/29/2024 0352   GLUCOSE 102 (H) 03/29/2024 0352   BUN 38 (H) 03/29/2024 0352   CREATININE 1.08 (H) 03/29/2024 0352   CREATININE 0.91 11/06/2022 0148   CALCIUM 9.3 03/29/2024 0352   PROT 5.8 (L) 03/19/2024 0421   ALBUMIN  2.5 (L) 03/19/2024 0421   AST 24 03/19/2024 0421   ALT 25 03/19/2024 0421   ALKPHOS 77 03/19/2024 0421   BILITOT 1.8 (H) 03/19/2024 0421   GFRNONAA 58 (L) 03/29/2024 0352   GFRAA 58 (L) 10/18/2019 1255   Lipase     Component Value Date/Time   LIPASE 34 03/16/2024 2215       Studies/Results: DG Abd Portable 1V Result Date: 03/29/2024 CLINICAL DATA:  Constipation.  Post hernia surgery 03/17/2024. EXAM: PORTABLE ABDOMEN - 1 VIEW COMPARISON:  03/28/2024 FINDINGS: Skin staples  over the midline and right abdomen. Surgical suture line over the left mid abdomen likely from previous bowel surgery. Surgical clips over the right lower quadrant and right pelvis. Bowel gas pattern is nonobstructive with air throughout the colon. Mild fecal retention over the hepatic flexure. No free peritoneal air. Remainder of the exam is unchanged. IMPRESSION: Nonobstructive bowel gas pattern with mild fecal retention over the hepatic flexure. Electronically Signed   By: Toribio Agreste M.D.   On: 03/29/2024 08:56   DG Abd Portable 1V Result Date: 03/28/2024 CLINICAL DATA:  [id number] Ileus, postoperative (HCC) [id number] EXAM: DG ABD PORTABLE 1V COMPARISON:  March 23, 2024 FINDINGS: Nondistended gas-filled stomach, small bowel, and colon. Nonobstructive bowel gas pattern.No pneumoperitoneum. No organomegaly or radiopaque calculi. No acute fracture or destructive lesion. Surgical skin staples in the right lower quadrant and midline lower abdomen. IMPRESSION: Nonobstructive bowel gas pattern. Electronically Signed   By: Rogelia Myers M.D.   On: 03/28/2024 11:23    Anti-infectives: Anti-infectives (From admission, onward)    Start     Dose/Rate Route Frequency Ordered Stop   03/17/24 0600  piperacillin -tazobactam (ZOSYN ) IVPB 3.375 g  Status:  Discontinued        3.375 g 12.5 mL/hr over 240 Minutes Intravenous Every 8 hours 03/17/24 0018 03/22/24  9050   03/17/24 0030  piperacillin -tazobactam (ZOSYN ) IVPB 3.375 g        3.375 g 100 mL/hr over 30 Minutes Intravenous  Once 03/17/24 0018 03/17/24 0134        Assessment/Plan  POD12,  s/p ex-lap with small bowel resection and wound exploration, repair of RIH by Dr. Ann  - had a BM yesterday.  On miralax .  Still complained of just not feeling totally normal.  KUB again done this am.  This is normal.  We discussed and I reassured her that it takes times after surgery for her gut to feel normal, bowel movements to become their baseline again, and for  her appetite to pick back up.  All of these things are expected to be off after surgery and take some time to improve.   -she is otherwise eating without N/V, moving her bowels.  Her abdomen is soft and appropriately tender. -will DC staples today.   -she is surgically stable for DC home from our standpoint. -d/w primary service on the war   FEN: reg diet VTE: LMWH ID: Zosyn  11/20>11/25    LOS: 12 days    Burnard FORBES Banter, [facility] Surgery 03/29/2024, 10:26 AM Please see Amion for pager number during day hours 7:00am-4:30pm

## 2024-03-29 NOTE — Progress Notes (Signed)
 PROGRESS NOTE        PATIENT DETAILS Name: Denise Hudson Age: 63 y.o. Sex: female Date of Birth: July 15, 1960 Admit Date: 03/16/2024 Admitting Physician Micaela Speaker, MD PCP:Pcp, No  Brief Summary: Patient is a 63 y.o.  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  Significant events: 11/20>> admit to [facility]  Significant studies: 11/19>> CT abdomen/pelvis: SBO-transition zone seen as a loop of small bowel enters lateral  ventral hernia. 11/20>> echo: EF 60-65%  Significant microbiology data: 11/20>> blood culture: No growth  Procedures: 11/20>> expiratory laparotomy with small bowel resection.  Consults: General Surgery Cards  Subjective:  Patient in bed denies any headache chest or abdominal pain, had 1 BM yesterday morning, passing flatus but has not had a good BM according to her and would like to have 1 before she leaves.  Objective: Vitals: Blood pressure (!) 141/76, pulse (!) 59, temperature 97.7 F (36.5 C), temperature source Oral, resp. rate 11, height 6' 1 (1.854 m), weight 67 kg, last menstrual period 09/23/2015, SpO2 97%.   Exam:  Awake Alert, No new F.N deficits, Normal affect [facility].AT,PERRAL Supple Neck, No JVD,   Symmetrical Chest wall movement, Good air movement bilaterally, CTAB RRR,No Gallops, Rubs or new Murmurs,  +ve B.Sounds, Abd Soft, minimal postop tenderness No Cyanosis, Clubbing or edema    Assessment/Plan:  SBO secondary to incarcerated incisional hernia S/p exploratory laparotomy/bowel resection by general surgery on 11/20 Slowly improving NG tube/Foley discontinued 11/22 Had 2 small bowel movements in the last 2 days however feels like she needs to  have a good bowel movement before she can leave, KUB stable exam stable, on bowel regimen, communicated with general surgery on 03/29/2024.  Paroxysmal supraventricular tachycardia Continues to have episodes of PSVT Previously on metoprolol -previous attempts at increasing doses of metoprolol  have resulted in bradycardia in the 40s Cardiology now following-has been switched from metoprolol  to low-dose Coreg -which she seems to be tolerating fine-with stable heart rate. Recent TSH/echo stable.    Hypertensive urgency BP continues to fluctuate quite a bit-somewhat on the higher side Continue amlodipine /Coreg /Avapro /Aldactone /hydralazine .  AKI Mild Likely hemodynamically mediated Has resolved with supportive care.  Hypokalemia Repleted.  Code status:   Code Status: Full Code   DVT Prophylaxis: enoxaparin  (LOVENOX ) injection 40 mg Start: 03/18/24 1630 SCDs Start: 03/17/24 0023 Place TED hose Start: 03/17/24 0023   Family Communication: None at bedside   Disposition Plan: Status is: Inpatient Remains inpatient appropriate because: Severity of illness   Planned Discharge Destination:Home   Diet: Diet Order             Diet regular Fluid consistency: Thin  Diet effective now                     Antimicrobial agents: Anti-infectives (From admission, onward)    Start     Dose/Rate Route Frequency Ordered Stop   03/17/24 0600  piperacillin -tazobactam (ZOSYN ) IVPB 3.375 g  Status:  Discontinued        3.375 g 12.5 mL/hr over 240 Minutes Intravenous Every 8 hours 03/17/24 0018 03/22/24 0949   03/17/24 0030  piperacillin -tazobactam (ZOSYN ) IVPB 3.375 g  3.375 g 100 mL/hr over 30 Minutes Intravenous  Once 03/17/24 0018 03/17/24 0134        MEDICATIONS: Scheduled Meds:  amLODipine   10 mg Oral Daily   carvedilol   3.125 mg Oral BID WC   docusate sodium   200 mg Oral BID   enoxaparin  (LOVENOX ) injection  40 mg Subcutaneous Q24H   famotidine  20 mg Oral BID    feeding supplement  1 Container Oral TID BM   feeding supplement  237 mL Oral BID BM   hydrALAZINE   75 mg Oral Q8H   irbesartan   300 mg Oral Daily   magnesium  hydroxide  30 mL Oral BID   pantoprazole   40 mg Oral BID   polyethylene glycol  17 g Oral BID   sodium chloride  flush  3 mL Intravenous Q12H   spironolactone   25 mg Oral Daily   Continuous Infusions:   PRN Meds:.acetaminophen  **OR** [DISCONTINUED] acetaminophen , ALPRAZolam , hydrALAZINE , labetalol , methocarbamol , ondansetron  **OR** ondansetron  (ZOFRAN ) IV, oxyCODONE , phenol, sodium chloride  flush, SMOG   I have personally reviewed following labs and imaging studies  LABORATORY DATA: CBC: Recent Labs  Lab 03/23/24 1003 03/26/24 0505 03/28/24 0335 03/29/24 0352  WBC 5.6 5.7 5.8 6.2  NEUTROABS  --  3.1 3.3 3.6  HGB 10.5* 10.9* 11.1* 10.7*  HCT 34.2* 34.6* 35.3* 34.1*  MCV 79.0* 78.1* 77.6* 77.9*  PLT 288 362 441* 494*    Basic Metabolic Panel: Recent Labs  Lab 03/23/24 0232 03/25/24 0446 03/26/24 0505 03/27/24 0339 03/28/24 0335 03/29/24 0352  NA 140 138 139 138 137 139  K 4.3 4.0 3.9 4.1 4.2 4.7  CL 113* 107 104 105 104 108  CO2 21* 22 22 23 23 22   GLUCOSE 129* 120* 101* 95 97 102*  BUN 21 23 27* 23 25* 38*  CREATININE 0.82 1.04* 0.89 0.87 0.90 1.08*  CALCIUM 8.8* 8.8* 9.0 8.9 9.0 9.3  MG 2.0  --  1.9  --  2.1 2.1  PHOS 3.7  --  4.2  --  4.2 5.0*    Urine analysis:    Component Value Date/Time   COLORURINE YELLOW 03/16/2024 2359   APPEARANCEUR HAZY (A) 03/16/2024 2359   LABSPEC 1.040 (H) 03/16/2024 2359   PHURINE 6.0 03/16/2024 2359   GLUCOSEU NEGATIVE 03/16/2024 2359   HGBUR SMALL (A) 03/16/2024 2359   BILIRUBINUR NEGATIVE 03/16/2024 2359   KETONESUR NEGATIVE 03/16/2024 2359   PROTEINUR 30 (A) 03/16/2024 2359   UROBILINOGEN 1.0 01/02/2015 0043   NITRITE NEGATIVE 03/16/2024 2359   LEUKOCYTESUR LARGE (A) 03/16/2024 2359    Sepsis Labs: Lactic Acid, Venous    Component Value Date/Time    LATICACIDVEN 2.1 (HH) 03/17/2024 0026    MICROBIOLOGY: No results found for this or any previous visit (from the past 240 hours).   RADIOLOGY STUDIES/RESULTS: DG Abd Portable 1V Result Date: 03/29/2024 CLINICAL DATA:  Constipation.  Post hernia surgery 03/17/2024. EXAM: PORTABLE ABDOMEN - 1 VIEW COMPARISON:  03/28/2024 FINDINGS: Skin staples over the midline and right abdomen. Surgical suture line over the left mid abdomen likely from previous bowel surgery. Surgical clips over the right lower quadrant and right pelvis. Bowel gas pattern is nonobstructive with air throughout the colon. Mild fecal retention over the hepatic flexure. No free peritoneal air. Remainder of the exam is unchanged. IMPRESSION: Nonobstructive bowel gas pattern with mild fecal retention over the hepatic flexure. Electronically Signed   By: Toribio Agreste M.D.   On: 03/29/2024 08:56   DG Abd Portable 1V Result Date:  03/28/2024 CLINICAL DATA:  [id number] Ileus, postoperative (HCC) [id number] EXAM: DG ABD PORTABLE 1V COMPARISON:  March 23, 2024 FINDINGS: Nondistended gas-filled stomach, small bowel, and colon. Nonobstructive bowel gas pattern.No pneumoperitoneum. No organomegaly or radiopaque calculi. No acute fracture or destructive lesion. Surgical skin staples in the right lower quadrant and midline lower abdomen. IMPRESSION: Nonobstructive bowel gas pattern. Electronically Signed   By: Rogelia Myers M.D.   On: 03/28/2024 11:23     LOS: 12 days   Lavada Stank, MD  [facility]    To contact the attending provider between 7A-7P or the covering provider during after hours 7P-7A, please log into the web site [email or web address] and access using universal [place] password for that web site. If you do not have the password, please call the hospital operator.  03/29/2024, 9:23 AM

## 2024-03-30 ENCOUNTER — Inpatient Hospital Stay (HOSPITAL_COMMUNITY)

## 2024-03-30 ENCOUNTER — Other Ambulatory Visit (HOSPITAL_COMMUNITY): Payer: Self-pay

## 2024-03-30 DIAGNOSIS — K460 Unspecified abdominal hernia with obstruction, without gangrene: Secondary | ICD-10-CM | POA: Diagnosis not present

## 2024-03-30 LAB — CBC WITH DIFFERENTIAL/PLATELET
Abs Immature Granulocytes: 0.03 K/uL (ref 0.00–0.07)
Basophils Absolute: 0.1 K/uL (ref 0.0–0.1)
Basophils Relative: 1 %
Eosinophils Absolute: 0.1 K/uL (ref 0.0–0.5)
Eosinophils Relative: 3 %
HCT: 36.3 % (ref 36.0–46.0)
Hemoglobin: 11.3 g/dL — ABNORMAL LOW (ref 12.0–15.0)
Immature Granulocytes: 1 %
Lymphocytes Relative: 30 %
Lymphs Abs: 1.7 K/uL (ref 0.7–4.0)
MCH: 24.5 pg — ABNORMAL LOW (ref 26.0–34.0)
MCHC: 31.1 g/dL (ref 30.0–36.0)
MCV: 78.6 fL — ABNORMAL LOW (ref 80.0–100.0)
Monocytes Absolute: 0.4 K/uL (ref 0.1–1.0)
Monocytes Relative: 7 %
Neutro Abs: 3.3 K/uL (ref 1.7–7.7)
Neutrophils Relative %: 58 %
Platelets: 509 K/uL — ABNORMAL HIGH (ref 150–400)
RBC: 4.62 MIL/uL (ref 3.87–5.11)
RDW: 15.5 % (ref 11.5–15.5)
WBC: 5.6 K/uL (ref 4.0–10.5)
nRBC: 0 % (ref 0.0–0.2)

## 2024-03-30 LAB — BASIC METABOLIC PANEL WITH GFR
Anion gap: 13 (ref 5–15)
BUN: 31 mg/dL — ABNORMAL HIGH (ref 8–23)
CO2: 27 mmol/L (ref 22–32)
Calcium: 9.5 mg/dL (ref 8.9–10.3)
Chloride: 102 mmol/L (ref 98–111)
Creatinine, Ser: 1.08 mg/dL — ABNORMAL HIGH (ref 0.44–1.00)
GFR, Estimated: 58 mL/min — ABNORMAL LOW (ref >60)
Glucose, Bld: 108 mg/dL — ABNORMAL HIGH (ref 70–99)
Potassium: 4.4 mmol/L (ref 3.5–5.1)
Sodium: 142 mmol/L (ref 135–145)

## 2024-03-30 LAB — MAGNESIUM: Magnesium: 2.6 mg/dL — ABNORMAL HIGH (ref 1.7–2.4)

## 2024-03-30 LAB — PHOSPHORUS: Phosphorus: 4.1 mg/dL (ref 2.5–4.6)

## 2024-03-30 MED ORDER — IOHEXOL 350 MG/ML SOLN
75.0000 mL | Freq: Once | INTRAVENOUS | Status: AC | PRN
  Administered 2024-03-30: 75 mL via INTRAVENOUS

## 2024-03-30 MED ORDER — CARVEDILOL 3.125 MG PO TABS
3.1250 mg | ORAL_TABLET | Freq: Two times a day (BID) | ORAL | 0 refills | Status: DC
  Filled 2024-03-30: qty 60, 30d supply, fill #0

## 2024-03-30 MED ORDER — ONDANSETRON HCL 4 MG PO TABS
4.0000 mg | ORAL_TABLET | Freq: Four times a day (QID) | ORAL | 0 refills | Status: DC | PRN
  Filled 2024-03-30: qty 20, 5d supply, fill #0

## 2024-03-30 MED ORDER — HYDRALAZINE HCL 25 MG PO TABS
75.0000 mg | ORAL_TABLET | Freq: Three times a day (TID) | ORAL | 0 refills | Status: DC
  Filled 2024-03-30: qty 270, 30d supply, fill #0

## 2024-03-30 MED ORDER — ACETAMINOPHEN 500 MG PO TABS
500.0000 mg | ORAL_TABLET | Freq: Three times a day (TID) | ORAL | 0 refills | Status: DC | PRN
  Filled 2024-03-30: qty 20, 7d supply, fill #0

## 2024-03-30 MED ORDER — IRBESARTAN 300 MG PO TABS
300.0000 mg | ORAL_TABLET | Freq: Every day | ORAL | 0 refills | Status: DC
  Filled 2024-03-30: qty 30, 30d supply, fill #0

## 2024-03-30 MED ORDER — AMLODIPINE BESYLATE 10 MG PO TABS
10.0000 mg | ORAL_TABLET | Freq: Every day | ORAL | 0 refills | Status: DC
  Filled 2024-03-30: qty 30, 30d supply, fill #0

## 2024-03-30 MED ORDER — PANTOPRAZOLE SODIUM 40 MG PO TBEC
40.0000 mg | DELAYED_RELEASE_TABLET | Freq: Every day | ORAL | 0 refills | Status: DC
  Filled 2024-03-30: qty 30, 30d supply, fill #0

## 2024-03-30 MED ORDER — SPIRONOLACTONE 25 MG PO TABS
25.0000 mg | ORAL_TABLET | Freq: Every day | ORAL | 0 refills | Status: DC
  Filled 2024-03-30: qty 30, 30d supply, fill #0

## 2024-03-30 NOTE — Progress Notes (Signed)
 PROGRESS NOTE        PATIENT DETAILS Name: Denise Hudson Age: 63 y.o. Sex: female Date of Birth: 29-Jan-1961 Admit Date: 03/16/2024 Admitting Physician Micaela Speaker, MD PCP:Pcp, No  Brief Summary: Patient is a 63 y.o.  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  Significant events: 11/20>> admit to [facility]  Significant studies: 11/19>> CT abdomen/pelvis: SBO-transition zone seen as a loop of small bowel enters lateral  ventral hernia. 11/20>> echo: EF 60-65%  Significant microbiology data: 11/20>> blood culture: No growth  Procedures: 11/20>> expiratory laparotomy with small bowel resection.  Consults: General Surgery Cards  Subjective:  Patient in bed denies any headache chest or abdominal pain, had 1 BM yesterday morning, passing flatus but has not had a good BM, mild nausea this morning, wants to talk to general surgery before she leaves.  Objective: Vitals: Blood pressure (!) 156/86, pulse 60, temperature 97.9 F (36.6 C), temperature source Oral, resp. rate 14, height 6' 1 (1.854 m), weight 67 kg, last menstrual period 09/23/2015, SpO2 98%.   Exam:  Awake Alert, No new F.N deficits, Normal affect [place].AT,PERRAL Supple Neck, No JVD,   Symmetrical Chest wall movement, Good air movement bilaterally, CTAB RRR,No Gallops, Rubs or new Murmurs,  +ve B.Sounds, Abd Soft, minimal postop tenderness No Cyanosis, Clubbing or edema    Assessment/Plan:  SBO secondary to incarcerated incisional hernia S/p exploratory laparotomy/bowel resection by general surgery on 11/20 Slowly improving NG tube/Foley discontinued 11/22 Had 2 small bowel movements in the last 2 days however feels like  she needs to have a good bowel movement before she can leave, KUB stable exam stable, on bowel regimen, she has been cared for discharge by general surgery on 03/28/2024 and 03/29/2024, patient still reluctant to leave, requested general surgery to see her again, we will get a CT scan of her abdomen pelvis to alleviate any concerns.  Clinically stable unfortunately she is preferring to stay in bed rather than sit in chair and ambulate.  No visible distress.  Case discussed with general surgery in detail on 03/28/2024, 03/29/2024 and 03/30/2024   Paroxysmal supraventricular tachycardia Continues to have episodes of PSVT Previously on metoprolol -previous attempts at increasing doses of metoprolol  have resulted in bradycardia in the 40s Cardiology now following-has been switched from metoprolol  to low-dose Coreg -which she seems to be tolerating fine-with stable heart rate. Recent TSH/echo stable.    Hypertensive urgency BP continues to fluctuate quite a bit-somewhat on the higher side Continue amlodipine /Coreg /Avapro /Aldactone /hydralazine .  AKI Mild Likely hemodynamically mediated Has resolved with supportive care.  Hypokalemia Repleted.  Code status:   Code Status: Full Code   DVT Prophylaxis: enoxaparin  (LOVENOX ) injection 40 mg Start: 03/18/24 1630 SCDs Start: 03/17/24 0023 Place TED hose Start: 03/17/24 0023   Family Communication: None at bedside   Disposition Plan: Status is: Inpatient Remains inpatient appropriate because: Severity of illness   Planned Discharge Destination:Home   Diet: Diet Order             Diet regular Fluid consistency: Thin  Diet effective now                     Antimicrobial agents:  Anti-infectives (From admission, onward)    Start     Dose/Rate Route Frequency Ordered Stop   03/17/24 0600  piperacillin -tazobactam (ZOSYN ) IVPB 3.375 g  Status:  Discontinued        3.375 g 12.5 mL/hr over 240 Minutes Intravenous Every 8 hours 03/17/24  0018 03/22/24 0949   03/17/24 0030  piperacillin -tazobactam (ZOSYN ) IVPB 3.375 g        3.375 g 100 mL/hr over 30 Minutes Intravenous  Once 03/17/24 0018 03/17/24 0134        MEDICATIONS: Scheduled Meds:  amLODipine   10 mg Oral Daily   carvedilol   3.125 mg Oral BID WC   docusate sodium   200 mg Oral BID   enoxaparin  (LOVENOX ) injection  40 mg Subcutaneous Q24H   famotidine   20 mg Oral BID   feeding supplement  1 Container Oral TID BM   feeding supplement  237 mL Oral BID BM   hydrALAZINE   75 mg Oral Q8H   irbesartan   300 mg Oral Daily   pantoprazole   40 mg Oral BID   polyethylene glycol  17 g Oral BID   sodium chloride  flush  3 mL Intravenous Q12H   spironolactone   25 mg Oral Daily   Continuous Infusions:   PRN Meds:.acetaminophen  **OR** [DISCONTINUED] acetaminophen , ALPRAZolam , hydrALAZINE , iohexol , labetalol , methocarbamol , ondansetron  **OR** ondansetron  (ZOFRAN ) IV, oxyCODONE , phenol, sodium chloride  flush, SMOG   I have personally reviewed following labs and imaging studies  LABORATORY DATA: CBC: Recent Labs  Lab 03/23/24 1003 03/26/24 0505 03/28/24 0335 03/29/24 0352 03/30/24 0505  WBC 5.6 5.7 5.8 6.2 5.6  NEUTROABS  --  3.1 3.3 3.6 3.3  HGB 10.5* 10.9* 11.1* 10.7* 11.3*  HCT 34.2* 34.6* 35.3* 34.1* 36.3  MCV 79.0* 78.1* 77.6* 77.9* 78.6*  PLT 288 362 441* 494* 509*    Basic Metabolic Panel: Recent Labs  Lab 03/26/24 0505 03/27/24 0339 03/28/24 0335 03/29/24 0352 03/30/24 0505  NA 139 138 137 139 142  K 3.9 4.1 4.2 4.7 4.4  CL 104 105 104 108 102  CO2 22 23 23 22 27   GLUCOSE 101* 95 97 102* 108*  BUN 27* 23 25* 38* 31*  CREATININE 0.89 0.87 0.90 1.08* 1.08*  CALCIUM 9.0 8.9 9.0 9.3 9.5  MG 1.9  --  2.1 2.1 2.6*  PHOS 4.2  --  4.2 5.0* 4.1    Urine analysis:    Component Value Date/Time   COLORURINE YELLOW 03/16/2024 2359   APPEARANCEUR HAZY (A) 03/16/2024 2359   LABSPEC 1.040 (H) 03/16/2024 2359   PHURINE 6.0 03/16/2024 2359   GLUCOSEU  NEGATIVE 03/16/2024 2359   HGBUR SMALL (A) 03/16/2024 2359   BILIRUBINUR NEGATIVE 03/16/2024 2359   KETONESUR NEGATIVE 03/16/2024 2359   PROTEINUR 30 (A) 03/16/2024 2359   UROBILINOGEN 1.0 01/02/2015 0043   NITRITE NEGATIVE 03/16/2024 2359   LEUKOCYTESUR LARGE (A) 03/16/2024 2359    Sepsis Labs: Lactic Acid, Venous    Component Value Date/Time   LATICACIDVEN 2.1 (HH) 03/17/2024 0026    MICROBIOLOGY: No results found for this or any previous visit (from the past 240 hours).   RADIOLOGY STUDIES/RESULTS: DG Abd Portable 1V Result Date: 03/30/2024 EXAM: 1 VIEW XRAY OF THE ABDOMEN 03/30/2024 07:54:00 AM COMPARISON: 03/29/2024 CLINICAL HISTORY: 63 year old female. Nausea. FINDINGS: BOWEL: Nonobstructive bowel gas pattern. Decreased retained stool of the hepatic flexure, and below average overall. SOFT TISSUES: Surgical clips in right lower quadrant and right mid abdomen. Skin staples have been removed. Left abdominal staple line. No  opaque urinary calculi. BONES: No acute osseous abnormality. LUNG BASES: Lung bases remain well aerated. IMPRESSION: 1. Nonobstructive bowel gas pattern. Below average volume of retained stool. Electronically signed by: Helayne Hurst MD 03/30/2024 08:00 AM EST RP Workstation: [id number]   DG Abd Portable 1V Result Date: 03/29/2024 CLINICAL DATA:  Constipation.  Post hernia surgery 03/17/2024. EXAM: PORTABLE ABDOMEN - 1 VIEW COMPARISON:  03/28/2024 FINDINGS: Skin staples over the midline and right abdomen. Surgical suture line over the left mid abdomen likely from previous bowel surgery. Surgical clips over the right lower quadrant and right pelvis. Bowel gas pattern is nonobstructive with air throughout the colon. Mild fecal retention over the hepatic flexure. No free peritoneal air. Remainder of the exam is unchanged. IMPRESSION: Nonobstructive bowel gas pattern with mild fecal retention over the hepatic flexure. Electronically Signed   By: Toribio Agreste M.D.   On:  03/29/2024 08:56   DG Abd Portable 1V Result Date: 03/28/2024 CLINICAL DATA:  [id number] Ileus, postoperative (HCC) [id number] EXAM: DG ABD PORTABLE 1V COMPARISON:  March 23, 2024 FINDINGS: Nondistended gas-filled stomach, small bowel, and colon. Nonobstructive bowel gas pattern.No pneumoperitoneum. No organomegaly or radiopaque calculi. No acute fracture or destructive lesion. Surgical skin staples in the right lower quadrant and midline lower abdomen. IMPRESSION: Nonobstructive bowel gas pattern. Electronically Signed   By: Rogelia Myers M.D.   On: 03/28/2024 11:23     LOS: 13 days   Lavada Stank, MD  [facility]    To contact the attending provider between 7A-7P or the covering provider during after hours 7P-7A, please log into the web site [email or web address] and access using universal [place] password for that web site. If you do not have the password, please call the hospital operator.  03/30/2024, 10:02 AM

## 2024-03-30 NOTE — Progress Notes (Signed)
 Progress Note  13 Days Post-Op  Subjective: Ate some pancake, sausage, and spaghetti yesterday.  No complaints with that just didn't eat a whole lot.  Overnight around 0300 she states she vomited.  It is unclear how much this was.  She is concerned some of this may be secondary to the amount of medications she is taking, but also she says she feels dehydrated and that I am drier than normal.  Objective: Vital signs in last 24 hours: Temp:  [97.8 F (36.6 C)-98.9 F (37.2 C)] 97.9 F (36.6 C) (12/03 0715) Pulse Rate:  [60-83] 60 (12/03 0715) Resp:  [10-19] 14 (12/03 0715) BP: (139-166)/(52-86) 156/86 (12/03 0715) SpO2:  [93 %-98 %] 98 % (12/03 0715) Last BM Date : 03/28/24  Intake/Output from previous day: No intake/output data recorded. Intake/Output this shift: No intake/output data recorded.  PE: Abd: Soft.  ND.  Midline and RLQ incisions c/d/I with steri-strips in place.  Minimally tender as expected.   Lab Results:  Recent Labs    03/29/24 0352 03/30/24 0505  WBC 6.2 5.6  HGB 10.7* 11.3*  HCT 34.1* 36.3  PLT 494* 509*   BMET Recent Labs    03/29/24 0352 03/30/24 0505  NA 139 142  K 4.7 4.4  CL 108 102  CO2 22 27  GLUCOSE 102* 108*  BUN 38* 31*  CREATININE 1.08* 1.08*  CALCIUM 9.3 9.5   PT/INR No results for input(s): LABPROT, INR in the last 72 hours. CMP     Component Value Date/Time   NA 142 03/30/2024 0505   K 4.4 03/30/2024 0505   CL 102 03/30/2024 0505   CO2 27 03/30/2024 0505   GLUCOSE 108 (H) 03/30/2024 0505   BUN 31 (H) 03/30/2024 0505   CREATININE 1.08 (H) 03/30/2024 0505   CREATININE 0.91 11/06/2022 0148   CALCIUM 9.5 03/30/2024 0505   PROT 5.8 (L) 03/19/2024 0421   ALBUMIN  2.5 (L) 03/19/2024 0421   AST 24 03/19/2024 0421   ALT 25 03/19/2024 0421   ALKPHOS 77 03/19/2024 0421   BILITOT 1.8 (H) 03/19/2024 0421   GFRNONAA 58 (L) 03/30/2024 0505   GFRAA 58 (L) 10/18/2019 1255   Lipase     Component Value Date/Time    LIPASE 34 03/16/2024 2215       Studies/Results: DG Abd Portable 1V Result Date: 03/30/2024 EXAM: 1 VIEW XRAY OF THE ABDOMEN 03/30/2024 07:54:00 AM COMPARISON: 03/29/2024 CLINICAL HISTORY: 63 year old female. Nausea. FINDINGS: BOWEL: Nonobstructive bowel gas pattern. Decreased retained stool of the hepatic flexure, and below average overall. SOFT TISSUES: Surgical clips in right lower quadrant and right mid abdomen. Skin staples have been removed. Left abdominal staple line. No opaque urinary calculi. BONES: No acute osseous abnormality. LUNG BASES: Lung bases remain well aerated. IMPRESSION: 1. Nonobstructive bowel gas pattern. Below average volume of retained stool. Electronically signed by: Helayne Hurst MD 03/30/2024 08:00 AM EST RP Workstation: [id number]   DG Abd Portable 1V Result Date: 03/29/2024 CLINICAL DATA:  Constipation.  Post hernia surgery 03/17/2024. EXAM: PORTABLE ABDOMEN - 1 VIEW COMPARISON:  03/28/2024 FINDINGS: Skin staples over the midline and right abdomen. Surgical suture line over the left mid abdomen likely from previous bowel surgery. Surgical clips over the right lower quadrant and right pelvis. Bowel gas pattern is nonobstructive with air throughout the colon. Mild fecal retention over the hepatic flexure. No free peritoneal air. Remainder of the exam is unchanged. IMPRESSION: Nonobstructive bowel gas pattern with mild fecal retention over the hepatic  flexure. Electronically Signed   By: Toribio Agreste M.D.   On: 03/29/2024 08:56   DG Abd Portable 1V Result Date: 03/28/2024 CLINICAL DATA:  [id number] Ileus, postoperative (HCC) [id number] EXAM: DG ABD PORTABLE 1V COMPARISON:  March 23, 2024 FINDINGS: Nondistended gas-filled stomach, small bowel, and colon. Nonobstructive bowel gas pattern.No pneumoperitoneum. No organomegaly or radiopaque calculi. No acute fracture or destructive lesion. Surgical skin staples in the right lower quadrant and midline lower abdomen. IMPRESSION:  Nonobstructive bowel gas pattern. Electronically Signed   By: Rogelia Myers M.D.   On: 03/28/2024 11:23    Anti-infectives: Anti-infectives (From admission, onward)    Start     Dose/Rate Route Frequency Ordered Stop   03/17/24 0600  piperacillin -tazobactam (ZOSYN ) IVPB 3.375 g  Status:  Discontinued        3.375 g 12.5 mL/hr over 240 Minutes Intravenous Every 8 hours 03/17/24 0018 03/22/24 0949   03/17/24 0030  piperacillin -tazobactam (ZOSYN ) IVPB 3.375 g        3.375 g 100 mL/hr over 30 Minutes Intravenous  Once 03/17/24 0018 03/17/24 0134        Assessment/Plan  POD13,  s/p ex-lap with small bowel resection and wound exploration, repair of RIH by Dr. Ann  - patient is eating some and seems to be tolerating this.  She had some emesis apparently overnight.  It is unclear how much this was.  The patient is not sure this is related to her diet vs medications, etc.   -again, repeat KUB normal this morning.  Her WBC is normal, AF, and her abdominal examine is benign -patient by report from primary service struggling with the idea of DC home and is concerned about how she is feeling. -the patient and I discussed yesterday, as documented in my note, that it is going to take her 6-8 weeks to fully recover.  It may take her a couple of weeks to get a better appetite back as well as normal bowel function.  She expressed understanding of all of these things. -given her continued hesitancy I have offered her a CT scan to rule out any possible complication causing her abnormal feeling.  At first she refused and stated just send me home.  After further discussion, she was agreeable to a CT to rule out etiology of her nausea and off feeling -if this is ok, patient remains surgically stable to DC home. -her follow up is in place and medications have already been sent to her pharmacy  -staples removed yesterday -d/w primary service on the war   FEN: reg diet VTE: LMWH ID: [email or web address]   11/20>11/25    LOS: 13 days    Burnard FORBES Banter, [facility] Surgery 03/30/2024, 8:20 AM Please see Amion for pager number during day hours 7:00am-4:30pm

## 2024-03-30 NOTE — TOC Transition Note (Addendum)
 Transition of Care [facility]) - Discharge Note   Patient Details  Name: Denise Hudson MRN: [id number] Date of Birth: 1960-10-22  [facility] CM/SW Contact:  [name], RN Phone Number: 03/30/2024, 12:05 PM   Clinical Narrative:    Patient will be discharging home today with daughter providing transportation.   No further needs identified by therapy.   New PCP appt set rescheduled from 12/2 to 12/8 due to patient discharge being delayed until 12/3. Info on AVS.  No further needs identified by CM.    Final next level of care: Home/Self Care Barriers to Discharge: No Barriers Identified   Patient Goals and CMS Choice            Discharge Placement                       Discharge Plan and Services Additional resources added to the After Visit Summary for                                       Social Drivers of Health (SDOH) Interventions SDOH Screenings   Food Insecurity: No Food Insecurity (03/17/2024)  Housing: Low Risk  (03/17/2024)  Transportation Needs: No Transportation Needs (03/17/2024)  Utilities: Not At Risk (03/17/2024)  Depression (PHQ2-9): Low Risk  (12/11/2022)  Tobacco Use: Medium Risk (03/17/2024)     Readmission Risk Interventions     No data to display

## 2024-03-30 NOTE — Discharge Summary (Signed)
 Discharge summary note.  Denise Hudson FMW:[id number] DOB: 10/15/1960 DOA: 03/16/2024  PCP: Pcp, No  Admit date: 03/16/2024  Discharge date: 03/30/2024  Admitted From: Home   Disposition:  Home   Recommendations for Outpatient Follow-up:   Follow up with PCP in 1-2 weeks  PCP Please obtain BMP/CBC, 2 view CXR in 1week,  (see Discharge instructions)   PCP Please follow up on the following pending results:    Home Health: None   Equipment/Devices: None  Consultations: Cards, CCS Discharge Condition: Stable    CODE STATUS: Full    Diet Recommendation: Heart Healthy    Chief Complaint  Patient presents with   Abdominal Pain     Brief history of present illness from the day of admission and additional interim summary    HTN (not on any antihypertensives) who presented to the ED with right lower abdominal pain-was found to have incarcerated incisional hernia-evaluated by general surgery-underwent emergent exploratory laparotomy with small bowel resection.   Significant events: 11/20>> admit to [facility]   Significant studies: 11/19>> CT abdomen/pelvis: SBO-transition zone seen as a loop of small bowel enters lateral  ventral hernia. 11/20>> echo: EF 60-65%   Significant microbiology data: 11/20>> blood culture: No growth   Procedures: 11/20>> expiratory laparotomy with small bowel resection.                                                                 Hospital Course   SBO secondary to incarcerated incisional hernia S/p exploratory laparotomy/bowel resection by general surgery on 11/20 Slowly improving NG tube/Foley discontinued 11/22 Had 2 small bowel movements in the last 2 days however feels like she needs to have a good bowel movement before she can leave, KUB stable exam stable, on bowel  regimen, she has been cared for discharge by general surgery on 03/28/2024 and 03/29/2024, patient still reluctant to leave, requested general surgery to see her again, she was seen by general surgery again on 03/30/2024, underwent repeat CT scan which was stable, cleared again by general surgery for discharge, they have extensively talked with the patient as have her multiple times in the last 3 days including twice today.  Clinically stable unfortunately she is preferring to stay in bed rather than sit in chair and ambulate.  No visible distress.  Case discussed with general surgery in detail on 03/28/2024, 03/29/2024 and 03/30/2024. patient will be discharged today requested to stay in the chair and daytime at home and ambulate frequently.  Will follow-up with PCP and general surgery postdischarge.     Paroxysmal supraventricular tachycardia Continues to have episodes of PSVT Previously on metoprolol -previous attempts at increasing doses of metoprolol  have resulted in bradycardia in the 40s Cardiology now following-has been switched from metoprolol  to low-dose Coreg -which she seems to be tolerating  fine-with stable heart rate. Recent TSH/echo stable.     Hypertensive urgency BP stable on present regimen Continue amlodipine /Coreg /Avapro /Aldactone /hydralazine .  PCP to monitor blood pressure along with BMP and magnesium  closely, follow-up with cardiology postdischarge.   AKI Mild Likely hemodynamically mediated Has resolved with supportive care.   Hypokalemia Repleted.   Discharge diagnosis     Principal Problem:   Incarcerated intra-abdominal hernia Active Problems:   SVT (supraventricular tachycardia)   Primary hypertension   SBO (small bowel obstruction) (HCC)   History of osteomyelitis   Incarcerated hernia    Discharge instructions    Discharge Instructions     Diet - low sodium heart healthy   Complete by: As directed    Discharge instructions   Complete by: As directed     CCS      [facility], [place] [phone]  OPEN ABDOMINAL SURGERY: POST OP INSTRUCTIONS  Always review your discharge instruction sheet given to you by the facility where your surgery was performed.  IF YOU HAVE DISABILITY OR FAMILY LEAVE FORMS, YOU MUST BRING THEM TO THE OFFICE FOR PROCESSING.  PLEASE DO NOT GIVE THEM TO YOUR DOCTOR.  1. A prescription for pain medication may be given to you upon discharge.  Take your pain medication as prescribed, if needed.  If narcotic pain medicine is not needed, then you may take acetaminophen  (Tylenol ) or ibuprofen  (Advil ) as needed. 2. Take your usually prescribed medications unless otherwise directed. 3. If you need a refill on your pain medication, please contact your pharmacy. They will contact our office to request authorization.  Prescriptions will not be filled after 5pm or on week-ends. 4. You should follow a light diet the first few days after arrival home, such as soup and crackers, pudding, etc.unless your doctor has advised otherwise. A high-fiber, low fat diet can be resumed as tolerated.   Be sure to include lots of fluids daily. Most patients will experience some swelling and bruising on the chest and neck area.  Ice packs will help.  Swelling and bruising can take several days to resolve 5. Most patients will experience some swelling and bruising in the area of the incision. Ice pack will help. Swelling and bruising can take several days to resolve..  6. It is common to experience some constipation if taking pain medication after surgery.  Increasing fluid intake and taking a stool softener will usually help or prevent this problem from occurring.  A mild laxative (Milk of Magnesia or Miralax ) should be taken according to package directions if there are no bowel movements after 48 hours. 7.  You may have steri-strips (small skin tapes) in place directly over the incision.  These strips should be left on the skin for 7-10 days.  If your  surgeon used skin glue on the incision, you may shower in 24 hours.  The glue will flake off over the next 2-3 weeks.  Any sutures or staples will be removed at the office during your follow-up visit. You may find that a light gauze bandage over your incision may keep your staples from being rubbed or pulled. You may shower and replace the bandage daily. 8. ACTIVITIES:  You may resume regular (light) daily activities beginning the next day-such as daily self-care, walking, climbing stairs-gradually increasing activities as tolerated.  You may have sexual intercourse when it is comfortable.  Refrain from any heavy lifting or straining until approved by your doctor.  a. You may drive when you no longer are taking prescription  pain medication, you can comfortably wear a seatbelt, and you can safely maneuver your car and apply brakes  9. You should see your doctor in the office for a follow-up appointment approximately two weeks after your surgery.  Make sure that you call for this appointment within a day or two after you arrive home to insure a convenient appointment time.   WHEN TO CALL YOUR DOCTOR: 1. Fever over 101.0 2. Inability to urinate 3. Nausea and/or vomiting 4. Extreme swelling or bruising 5. Continued bleeding from incision. 6. Increased pain, redness, or drainage from the incision. 7. Difficulty swallowing or breathing 8. Muscle cramping or spasms. 9. Numbness or tingling in hands or feet or around lips.  The clinic staff is available to answer your questions during regular business hours.  Please don't hesitate to call and ask to speak to one of the nurses if you have concerns.  For further questions, please visit [email or web address]   Follow with Primary MD  in 7 days   Get CBC, CMP, Magnesium , 2 view Chest X ray -  checked next visit with your primary MD  Activity: As tolerated with Full fall precautions use walker/cane & assistance as needed  Disposition Home      Diet: Heart Healthy    Special Instructions: If you have smoked or chewed Tobacco  in the last 2 yrs please stop smoking, stop any regular Alcohol  and or any Recreational drug use.  On your next visit with your primary care physician please Get Medicines reviewed and adjusted.  Please request your Prim.MD to go over all Hospital Tests and Procedure/Radiological results at the follow up, please get all Hospital records sent to your Prim MD by signing hospital release before you go home.  If you experience worsening of your admission symptoms, develop shortness of breath, life threatening emergency, suicidal or homicidal thoughts you must seek medical attention immediately by calling 911 or calling your MD immediately  if symptoms less severe.  You Must read complete instructions/literature along with all the possible adverse reactions/side effects for all the Medicines you take and that have been prescribed to you. Take any new Medicines after you have completely understood and accpet all the possible adverse reactions/side effects.   Do not drive when taking Pain medications.  Do not take more than prescribed Pain, Sleep and Anxiety Medications  Wear Seat belts while driving.   Discharge wound care:   Complete by: As directed    Kindly keep your Postsurgical site clean and dry at all times   Increase activity slowly   Complete by: As directed        Discharge Medications   Allergies as of 03/30/2024   No Known Allergies      Medication List     TAKE these medications    acetaminophen  500 MG tablet Commonly known as: TYLENOL  Take 1 tablet (500 mg total) by mouth every 8 (eight) hours as needed for mild pain (pain score 1-3) or moderate pain (pain score 4-6).   amLODipine  10 MG tablet Commonly known as: NORVASC  Take 1 tablet (10 mg total) by mouth daily. Start taking on: March 31, 2024   carvedilol  3.125 MG tablet Commonly known as: COREG  Take 1 tablet (3.125 mg  total) by mouth 2 (two) times daily with a meal.   docusate sodium  100 MG capsule Commonly known as: Colace Take 1 capsule (100 mg total) by mouth 2 (two) times daily. Okay to decrease to once daily or stop taking  if having loose bowel movements   hydrALAZINE  25 MG tablet Commonly known as: APRESOLINE  Take 3 tablets (75 mg total) by mouth every 8 (eight) hours.   irbesartan  300 MG tablet Commonly known as: AVAPRO  Take 1 tablet (300 mg total) by mouth daily. Start taking on: March 31, 2024   ondansetron  4 MG tablet Commonly known as: ZOFRAN  Take 1 tablet (4 mg total) by mouth every 6 (six) hours as needed for nausea.   oxyCODONE  5 MG immediate release tablet Commonly known as: Roxicodone  Take 1 tablet (5 mg total) by mouth every 8 (eight) hours as needed. Alternate tylenol  and ibuprofen  for the first few days. Take narcotic pain medication only if needed for severe/ breakthrough pain.   pantoprazole  40 MG tablet Commonly known as: PROTONIX  Take 1 tablet (40 mg total) by mouth daily.   spironolactone  25 MG tablet Commonly known as: ALDACTONE  Take 1 tablet (25 mg total) by mouth daily. Start taking on: March 31, 2024               Discharge Care Instructions  (From admission, onward)           Start     Ordered   03/30/24 0000  Discharge wound care:       Comments: Kindly keep your Postsurgical site clean and dry at all times   03/30/24 1120             Follow-up Information     Ann Fine, MD. Go on 04/13/2024.   Specialty: General Surgery Why: 10:30 AM. Please arrive 15 min prior to appointment time to check in. Contact information: [address] [place] [address] [phone]         [facility] Follow up.   Why: Your appointment is scheduled for Tuesday, December 2nd at 9:30AM. Please arrive early, bring insurance cards, and a list of current medications. Contact information: [phone]  [address] [place] [address]        Jeffrie Oneil BROCKS, MD. Schedule an appointment as soon as possible for a visit in 1 week(s).   Specialty: Cardiology Contact information: [address] [place] [address] [phone]                 Major procedures and Radiology Reports - PLEASE review detailed and final reports thoroughly  -       CT ABDOMEN PELVIS W CONTRAST Result Date: 03/30/2024 EXAM: CT ABDOMEN AND PELVIS WITH CONTRAST 03/30/2024 10:25:51 AM TECHNIQUE: CT of the abdomen and pelvis was performed with the administration of 75 mL of iohexol  (OMNIPAQUE ) 350 MG/ML injection. Multiplanar reformatted images are provided for review. Automated exposure control, iterative reconstruction, and/or weight-based adjustment of the mA/kV was utilized to reduce the radiation dose to as low as reasonably achievable. COMPARISON: 03/16/2024 CLINICAL HISTORY: s/p RIH repair with some nausea FINDINGS: LOWER CHEST: No acute abnormality. LIVER: The liver is unremarkable. GALLBLADDER AND BILE DUCTS: Gallbladder is unremarkable. No biliary ductal dilatation. SPLEEN: No acute abnormality. PANCREAS: No acute abnormality. ADRENAL GLANDS: No acute abnormality. KIDNEYS, URETERS AND BLADDER: Right upper pole simple appearing renal cyst measures 4.2 cm, stable. Per consensus, no follow-up is needed for simple Bosniak type 1 and 2 renal cysts, unless the patient has a malignancy history or risk factors. No stones in the kidneys or ureters. No hydronephrosis. No perinephric or periureteral stranding. Urinary bladder is unremarkable. GI AND BOWEL: Stomach demonstrates no acute abnormality. There is no bowel  obstruction. Questionable small fluid collection in the right perirectal space on image 75 measuring up to 1.4 cm. This is new since the prior study. PERITONEUM AND RETROPERITONEUM: No ascites. No free air. VASCULATURE: Aorta is normal in caliber. Aortic atherosclerosis. LYMPH NODES: No  lymphadenopathy. REPRODUCTIVE ORGANS: No acute abnormality. BONES AND SOFT TISSUES: Interval repair of right lateral abdominal wall hernia. No acute osseous abnormality. No focal soft tissue abnormality. IMPRESSION: 1. Interval repair of right lateral abdominal wall hernia. 2. Questionable small right perirectal space fluid collection measuring up to 1.4 cm, new since the prior study. This is not drainable due to its location and small size. Consider follow-up imaging to assess for change. Electronically signed by: Franky Crease MD 03/30/2024 10:56 AM EST RP Workstation: [id number]   DG Abd Portable 1V Result Date: 03/30/2024 EXAM: 1 VIEW XRAY OF THE ABDOMEN 03/30/2024 07:54:00 AM COMPARISON: 03/29/2024 CLINICAL HISTORY: 63 year old female. Nausea. FINDINGS: BOWEL: Nonobstructive bowel gas pattern. Decreased retained stool of the hepatic flexure, and below average overall. SOFT TISSUES: Surgical clips in right lower quadrant and right mid abdomen. Skin staples have been removed. Left abdominal staple line. No opaque urinary calculi. BONES: No acute osseous abnormality. LUNG BASES: Lung bases remain well aerated. IMPRESSION: 1. Nonobstructive bowel gas pattern. Below average volume of retained stool. Electronically signed by: Helayne Hurst MD 03/30/2024 08:00 AM EST RP Workstation: [id number]   DG Abd Portable 1V Result Date: 03/29/2024 CLINICAL DATA:  Constipation.  Post hernia surgery 03/17/2024. EXAM: PORTABLE ABDOMEN - 1 VIEW COMPARISON:  03/28/2024 FINDINGS: Skin staples over the midline and right abdomen. Surgical suture line over the left mid abdomen likely from previous bowel surgery. Surgical clips over the right lower quadrant and right pelvis. Bowel gas pattern is nonobstructive with air throughout the colon. Mild fecal retention over the hepatic flexure. No free peritoneal air. Remainder of the exam is unchanged. IMPRESSION: Nonobstructive bowel gas pattern with mild fecal retention over the hepatic  flexure. Electronically Signed   By: Toribio Agreste M.D.   On: 03/29/2024 08:56   DG Abd Portable 1V Result Date: 03/28/2024 CLINICAL DATA:  [id number] Ileus, postoperative (HCC) [id number] EXAM: DG ABD PORTABLE 1V COMPARISON:  March 23, 2024 FINDINGS: Nondistended gas-filled stomach, small bowel, and colon. Nonobstructive bowel gas pattern.No pneumoperitoneum. No organomegaly or radiopaque calculi. No acute fracture or destructive lesion. Surgical skin staples in the right lower quadrant and midline lower abdomen. IMPRESSION: Nonobstructive bowel gas pattern. Electronically Signed   By: Rogelia Myers M.D.   On: 03/28/2024 11:23   DG Abd 1 View Result Date: 03/23/2024 EXAM: 1 VIEW XRAY OF THE ABDOMEN 03/23/2024 01:41:00 PM COMPARISON: 03/22/2024 CLINICAL HISTORY: 01250 Ileus (HCC) [id number] FINDINGS: BOWEL: Slight decrease in gaseous distension of small bowel. SOFT TISSUES: Right lower quadrant surgical staples and clips noted. Postsurgical changes are noted. No opaque urinary calculi. BONES: No acute osseous abnormality. IMPRESSION: 1. Slight decrease in gaseous distension of small bowel, consistent with postoperative ileus. Electronically signed by: Oneil Devonshire MD 03/23/2024 08:40 PM EST RP Workstation: [id number]   DG Abd 1 View Result Date: 03/22/2024 EXAM: 1 VIEW XRAY OF THE ABDOMEN 03/22/2024 06:43:28 AM COMPARISON: KUB dated 07/13/2006. CLINICAL HISTORY: [id number] Nausea \\T \ vomiting [id number] Nausea \\T \ vomiting FINDINGS: BOWEL: Mild diffuse gaseous distention of the bowel. Mild formed fecal material within the right colon. There is no evidence of obstruction. SOFT TISSUES: The patient is status post recent laparotomy and bolus. Moses sutures are noted within the left mid abdomen. Several surgical clips  are noted in the right lower quadrant. No opaque urinary calculi. BONES: No acute osseous abnormality. IMPRESSION: 1. No bowel obstruction. 2. Status post recent laparotomy with surgical clips in the right  lower quadrant and sutures in the left mid abdomen. Electronically signed by: Evalene Coho MD 03/22/2024 06:54 AM EST RP Workstation: [id number]   ECHOCARDIOGRAM COMPLETE Result Date: 03/18/2024    ECHOCARDIOGRAM REPORT   Patient Name:   Denise Hudson Date of Exam: 03/17/2024 Medical Rec #:  [id number]     Height:       73.0 in Accession #:    [id number]    Weight:       147.7 lb Date of Birth:  [birth year]     BSA:          1.892 m Patient Age:    63 years      BP:           171/69 mmHg Patient Gender: F             HR:           58 bpm. Exam Location:  Inpatient Procedure: 2D Echo, Cardiac Doppler and Color Doppler (Both Spectral and Color            Flow Doppler were utilized during procedure). Indications:    SVT (supraventricular tachycardia)  History:        Patient has no prior history of Echocardiogram examinations.                 Risk Factors:Hypertension.  Sonographer:    Philomena Daring Referring Phys: [id number] SUBRINA SUNDIL IMPRESSIONS  1. Left ventricular ejection fraction, by estimation, is 60 to 65%. The left ventricle has normal function. The left ventricle has no regional wall motion abnormalities. There is mild concentric left ventricular hypertrophy. Left ventricular diastolic parameters are consistent with Grade I diastolic dysfunction (impaired relaxation).  2. Right ventricular systolic function is normal. The right ventricular size is normal.  3. The mitral valve is normal in structure. Trivial mitral valve regurgitation. Moderate mitral stenosis.  4. The aortic valve is tricuspid. There is mild calcification of the aortic valve. Aortic valve regurgitation is not visualized. Aortic valve sclerosis/calcification is present, without any evidence of aortic stenosis. FINDINGS  Left Ventricle: Left ventricular ejection fraction, by estimation, is 60 to 65%. The left ventricle has normal function. The left ventricle has no regional wall motion abnormalities. The left ventricular internal cavity  size was normal in size. There is  mild concentric left ventricular hypertrophy. Left ventricular diastolic parameters are consistent with Grade I diastolic dysfunction (impaired relaxation). Right Ventricle: The right ventricular size is normal. No increase in right ventricular wall thickness. Right ventricular systolic function is normal. Left Atrium: Left atrial size was normal in size. Right Atrium: Right atrial size was normal in size. Pericardium: There is no evidence of pericardial effusion. Mitral Valve: The mitral valve is normal in structure. Trivial mitral valve regurgitation. Moderate mitral valve stenosis. Tricuspid Valve: The tricuspid valve is normal in structure. Tricuspid valve regurgitation is trivial. No evidence of tricuspid stenosis. Aortic Valve: The aortic valve is tricuspid. There is mild calcification of the aortic valve. Aortic valve regurgitation is not visualized. Aortic valve sclerosis/calcification is present, without any evidence of aortic stenosis. Aortic valve mean gradient measures 5.0 mmHg. Aortic valve peak gradient measures 9.2 mmHg. Aortic valve area, by VTI measures 2.23 cm. Pulmonic Valve: The pulmonic valve was normal in structure. Pulmonic valve regurgitation is trivial. No  evidence of pulmonic stenosis. Aorta: The aortic root is normal in size and structure. Venous: The inferior vena cava was not well visualized. IAS/Shunts: No atrial level shunt detected by color flow Doppler.  LEFT VENTRICLE PLAX 2D LVIDd:         4.70 cm   Diastology LVIDs:         3.30 cm   LV e' medial:    7.18 cm/s LV PW:         1.20 cm   LV E/e' medial:  9.5 LV IVS:        1.20 cm   LV e' lateral:   6.20 cm/s LVOT diam:     2.10 cm   LV E/e' lateral: 11.0 LV SV:         71 LV SV Index:   38 LVOT Area:     3.46 cm  RIGHT VENTRICLE RV Basal diam:  3.10 cm RV Mid diam:    2.30 cm RV S prime:     11.20 cm/s TAPSE (M-mode): 3.2 cm LEFT ATRIUM             Index        RIGHT ATRIUM           Index LA  diam:        2.90 cm 1.53 cm/m   RA Area:     18.20 cm LA Vol (A2C):   46.8 ml 24.74 ml/m  RA Volume:   43.50 ml  23.00 ml/m LA Vol (A4C):   51.0 ml 26.96 ml/m LA Biplane Vol: 51.7 ml 27.33 ml/m  AORTIC VALVE AV Area (Vmax):    2.18 cm AV Area (Vmean):   2.20 cm AV Area (VTI):     2.23 cm AV Vmax:           152.00 cm/s AV Vmean:          100.000 cm/s AV VTI:            0.320 m AV Peak Grad:      9.2 mmHg AV Mean Grad:      5.0 mmHg LVOT Vmax:         95.80 cm/s LVOT Vmean:        63.600 cm/s LVOT VTI:          0.206 m LVOT/AV VTI ratio: 0.64  AORTA Ao Root diam: 2.60 cm Ao Asc diam:  2.90 cm MITRAL VALVE                TRICUSPID VALVE MV Area (PHT): 2.65 cm     TR Peak grad:   6.2 mmHg MV Decel Time: 286 msec     TR Vmax:        124.00 cm/s MV E velocity: 68.30 cm/s MV A velocity: 118.00 cm/s  SHUNTS MV E/A ratio:  0.58         Systemic VTI:  0.21 m                             Systemic Diam: 2.10 cm Toribio Fuel MD Electronically signed by Toribio Fuel MD Signature Date/Time: 03/18/2024/12:10:56 AM    Final    DG CHEST PORT 1 VIEW Result Date: 03/17/2024 CLINICAL DATA:  Nasogastric tube evaluation. EXAM: PORTABLE CHEST 1 VIEW COMPARISON:  03/17/2024 at 12:25 a.m. FINDINGS: Interval placement of nasogastric tube which courses into the region of the stomach and off the inferior aspect of the  images tip is not visualized. Lungs are adequately inflated without effusion. Subtle bibasilar density likely atelectasis, although early infection is possible. Cardiomediastinal silhouette and remainder of the exam is unchanged. IMPRESSION: 1. Interval placement of nasogastric tube which courses into the region of the stomach and off the image as tip is not visualized. 2. Subtle bibasilar density likely atelectasis, although early infection is possible. Electronically Signed   By: Toribio Agreste M.D.   On: 03/17/2024 15:42   DG Chest 2 View Result Date: 03/17/2024 EXAM: 2 VIEW(S) XRAY OF THE CHEST  03/17/2024 12:30:50 AM COMPARISON: None available. CLINICAL HISTORY: abd pain FINDINGS: LINES, TUBES AND DEVICES: Pacer/resuscitation pads over left chest. LUNGS AND PLEURA: No focal pulmonary opacity. No pleural effusion. No pneumothorax. HEART AND MEDIASTINUM: Cardiac size is within normal limits. No acute abnormality of the mediastinal silhouette. BONES AND SOFT TISSUES: No acute osseous abnormality. IMPRESSION: 1. No acute findings. Electronically signed by: Dorethia Molt MD 03/17/2024 12:44 AM EST RP Workstation: [id number]   CT ABDOMEN PELVIS W CONTRAST Result Date: 03/16/2024 CLINICAL DATA:  Hard knot to her lower right abdomen. EXAM: CT ABDOMEN AND PELVIS WITH CONTRAST TECHNIQUE: Multidetector CT imaging of the abdomen and pelvis was performed using the standard protocol following bolus administration of intravenous contrast. RADIATION DOSE REDUCTION: This exam was performed according to the departmental dose-optimization program which includes automated exposure control, adjustment of the mA and/or kV according to patient size and/or use of iterative reconstruction technique. CONTRAST:  75mL OMNIPAQUE  IOHEXOL  350 MG/ML SOLN COMPARISON:  August 10, 2022 FINDINGS: Lower chest: No acute abnormality. Hepatobiliary: No focal liver abnormality is seen. No gallstones, gallbladder wall thickening, or biliary dilatation. Pancreas: Unremarkable. No pancreatic ductal dilatation or surrounding inflammatory changes. Spleen: Several tiny, stable foci of parenchymal low attenuation are seen scattered throughout the spleen. Adrenals/Urinary Tract: Adrenal glands are unremarkable. Kidneys are normal in size. A 4.2 cm simple cyst is seen within the mid right kidney. A 3 mm nonobstructing renal calculus is seen within the lower pole of the right kidney. A 2 mm nonobstructing renal calculus is present within the mid to lower left kidney. Bladder is unremarkable. Stomach/Bowel: There is a small hiatal hernia. The appendix is  not identified. Multiple dilated small bowel loops are seen throughout the abdomen and pelvis (maximum small bowel diameter of approximately 3.1 cm). A transition zone is seen as a loop of small bowel enters within a lateral ventral hernia seen along the mid to lower right abdominal wall (see below). Multiple surgical clips are seen within the pelvis on the right. Vascular/Lymphatic: Aortic atherosclerosis. No enlarged abdominal or pelvic lymph nodes. Reproductive: Uterus and bilateral adnexa are unremarkable. Other: There is a 3.8 cm x 8.1 cm x 9.7 cm ventral hernia seen along the mid to lower abdomen on the right. This contains moderately inflamed mesenteric fat, fluid and a short segment mildly thickened small bowel that abruptly narrows as it enters the hernia. No abdominopelvic fluid Musculoskeletal: Multilevel chronic and degenerative changes are seen throughout the lumbar spine. IMPRESSION: 1. Small bowel obstruction with a transition zone seen as a loop of small bowel enters within a lateral ventral hernia seen along the mid to lower abdomen on the right. 2. Short segment of thickened and inflamed small bowel within the previously noted right lateral ventral hernia. An early closed loop small bowel obstruction cannot be excluded. 3. Bilateral subcentimeter nonobstructing renal calculi. 4. Small hiatal hernia. 5. 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 6. Aortic  atherosclerosis. Electronically Signed   By: Suzen Dials M.D.   On: 03/16/2024 23:20    Micro Results    No results found for this or any previous visit (from the past 240 hours).  Today   Subjective    Denise Hudson today has no headache,no chest abdominal pain,no new weakness tingling or numbness, feels much better   Objective   Blood pressure 150/80, pulse 60, temperature 97.9 F (36.6 C), temperature source Oral, resp. rate 14, height 6' 1 (1.854 m), weight 67 kg, last menstrual period  09/23/2015, SpO2 98%.  No intake or output data in the 24 hours ending 03/30/24 1120  Exam  Awake Alert, No new F.N deficits,    [place].AT,PERRAL Supple Neck,   Symmetrical Chest wall movement, Good air movement bilaterally, CTAB RRR,No Gallops,   +ve B.Sounds, Abd Soft, Non tender,  No Cyanosis, Clubbing or edema    Data Review   Recent Labs  Lab 03/26/24 0505 03/28/24 0335 03/29/24 0352 03/30/24 0505  WBC 5.7 5.8 6.2 5.6  HGB 10.9* 11.1* 10.7* 11.3*  HCT 34.6* 35.3* 34.1* 36.3  PLT 362 441* 494* 509*  MCV 78.1* 77.6* 77.9* 78.6*  MCH 24.6* 24.4* 24.4* 24.5*  MCHC 31.5 31.4 31.4 31.1  RDW 15.0 14.9 15.1 15.5  LYMPHSABS 1.7 1.7 1.8 1.7  MONOABS 0.6 0.5 0.5 0.4  EOSABS 0.2 0.2 0.2 0.1  BASOSABS 0.1 0.1 0.1 0.1    Recent Labs  Lab 03/26/24 0505 03/27/24 0339 03/28/24 0335 03/29/24 0352 03/30/24 0505  NA 139 138 137 139 142  K 3.9 4.1 4.2 4.7 4.4  CL 104 105 104 108 102  CO2 22 23 23 22 27   ANIONGAP 13 10 10 9 13   GLUCOSE 101* 95 97 102* 108*  BUN 27* 23 25* 38* 31*  CREATININE 0.89 0.87 0.90 1.08* 1.08*  MG 1.9  --  2.1 2.1 2.6*  PHOS 4.2  --  4.2 5.0* 4.1  CALCIUM 9.0 8.9 9.0 9.3 9.5    Total Time in preparing paper work, data evaluation and todays exam - 35 minutes  Signature  -    Lavada Stank M.D on 03/30/2024 at 11:20 AM   -  To page go to [email or web address]

## 2024-03-30 NOTE — Plan of Care (Signed)
  Problem: Education: Goal: Knowledge of General Education information will improve Description: Including pain rating scale, medication(s)/side effects and non-pharmacologic comfort measures Outcome: Progressing   Problem: Clinical Measurements: Goal: Will remain free from infection Outcome: Progressing Goal: Respiratory complications will improve Outcome: Progressing   Problem: Activity: Goal: Risk for activity intolerance will decrease Outcome: Progressing   Problem: Elimination: Goal: Will not experience complications related to urinary retention Outcome: Progressing   Problem: Pain Managment: Goal: General experience of comfort will improve and/or be controlled Outcome: Progressing   Problem: Elimination: Goal: Will not experience complications related to bowel motility Outcome: Not Progressing

## 2024-03-30 NOTE — Progress Notes (Signed)
 Review of her CT scan does not reveal any complicating features that would further explain her intermittent nausea and spit up episode from overnight.  She does have a small 1.4cm fluid collection in the perirectal space.  This does not require abx and does not require intervention.  It would not be causing her symptoms.  She is surgically stable for DC.  Discussed with primary service.  Leala Bryand E Jayvien Rowlette, PA-C

## 2024-03-30 NOTE — Plan of Care (Signed)
  Problem: Education: Goal: Knowledge of General Education information will improve Description: Including pain rating scale, medication(s)/side effects and non-pharmacologic comfort measures Outcome: Adequate for Discharge   Problem: Health Behavior/Discharge Planning: Goal: Ability to manage health-related needs will improve Outcome: Adequate for Discharge   Problem: Clinical Measurements: Goal: Ability to maintain clinical measurements within normal limits will improve Outcome: Adequate for Discharge Goal: Will remain free from infection Outcome: Adequate for Discharge Goal: Diagnostic test results will improve Outcome: Adequate for Discharge Goal: Respiratory complications will improve Outcome: Adequate for Discharge Goal: Cardiovascular complication will be avoided Outcome: Adequate for Discharge   Problem: Activity: Goal: Risk for activity intolerance will decrease Outcome: Adequate for Discharge   Problem: Nutrition: Goal: Adequate nutrition will be maintained Outcome: Adequate for Discharge   Problem: Coping: Goal: Level of anxiety will decrease Outcome: Adequate for Discharge   Problem: Elimination: Goal: Will not experience complications related to bowel motility Outcome: Adequate for Discharge Goal: Will not experience complications related to urinary retention Outcome: Adequate for Discharge   Problem: Pain Managment: Goal: General experience of comfort will improve and/or be controlled Outcome: Adequate for Discharge   Problem: Safety: Goal: Ability to remain free from injury will improve Outcome: Adequate for Discharge   Problem: Skin Integrity: Goal: Risk for impaired skin integrity will decrease Outcome: Adequate for Discharge   Problem: Acute Rehab PT Goals(only PT should resolve) Goal: Pt/caregiver will Perform Home Exercise Program Outcome: Adequate for Discharge

## 2024-03-30 NOTE — Progress Notes (Signed)
 DISCHARGE NOTE HOME  [name]  discharged Home per MD order. Discussed prescriptions and follow up appointments with the patient. Prescriptions given to patient; medication list explained in detail. Patient verbalized understanding.   Skin clean, dry and intact without evidence of skin break down, no evidence of skin tears noted. IV catheter discontinued intact. Site without signs and symptoms of complications. Dressing and pressure applied. Pt denies pain at the site currently. No complaints noted.   Patient free of lines, drains, and wounds.   An After Visit Summary (AVS) was printed and given to the patient.   Erendida Wrenn, Cena Helling, RN

## 2024-04-04 ENCOUNTER — Ambulatory Visit

## 2024-04-04 ENCOUNTER — Other Ambulatory Visit (HOSPITAL_COMMUNITY): Payer: Self-pay

## 2024-04-04 ENCOUNTER — Other Ambulatory Visit: Payer: Self-pay

## 2024-04-04 VITALS — BP 136/76 | HR 88 | Ht 66.0 in | Wt 148.8 lb

## 2024-04-04 DIAGNOSIS — R109 Unspecified abdominal pain: Secondary | ICD-10-CM

## 2024-04-04 DIAGNOSIS — I10 Essential (primary) hypertension: Secondary | ICD-10-CM

## 2024-04-04 DIAGNOSIS — K56609 Unspecified intestinal obstruction, unspecified as to partial versus complete obstruction: Secondary | ICD-10-CM

## 2024-04-04 MED ORDER — ACETAMINOPHEN 500 MG PO TABS
1000.0000 mg | ORAL_TABLET | Freq: Four times a day (QID) | ORAL | 0 refills | Status: AC
  Filled 2024-04-04: qty 30, 4d supply, fill #0

## 2024-04-04 MED ORDER — LIDOCAINE 5 % EX PTCH
2.0000 | MEDICATED_PATCH | CUTANEOUS | 0 refills | Status: DC
  Filled 2024-04-04: qty 60, 30d supply, fill #0

## 2024-04-04 MED ORDER — POLYETHYLENE GLYCOL 3350 17 GM/SCOOP PO POWD
17.0000 g | Freq: Two times a day (BID) | ORAL | 1 refills | Status: DC | PRN
  Filled 2024-04-04: qty 3350, 99d supply, fill #0

## 2024-04-04 NOTE — Assessment & Plan Note (Signed)
 Pt w/ elevated BP and on her home regimen. I suspected her elevated BP probably d/t her pain as well.  - Will continue to monitor - f/u with Cardiology per schedule

## 2024-04-04 NOTE — Patient Instructions (Addendum)
   It was great to see you!  Our plans for today:  - Increase Tylenol  from 500 mg Q6HPRN to 1000 mg every 6 hours  - Starting Lidocaine  patch - apply patch at near the incision sites - Starting MiraLax  17 g daily for constipation - Recommending ENSURE or nutrition supplement drink as needed high protein diet to help with wound healing - Please call General surgery if pain worsening - If develop fever, worsening pain, nauseated or vomiting please visit ED  We are checking some labs today, we will release these results to your MyChart.  Take care and seek immediate care sooner if you develop any concerns.     Denise Hudson HAS PGY 1 Family Medicine Resident [facility]  [address] [place], [place] [address] Fax [phone] Phone [phone] 04/04/2024, 10:54 AM

## 2024-04-04 NOTE — Progress Notes (Signed)
    SUBJECTIVE:   CHIEF COMPLAINT / HPI: Hospital f/u   Ms. Funaro is a 63 YO female present at [facility] clinic today with concerns below  Abdominal pain S/p exploratory laparotomy/bowel resection by general surgery on 11/20. Pt still c/o abdominal pain w/ low appetite. State unable to eat well since discharged. She has finished her Oxycodone  as prescribed at discharge. State had small BM yesterday. Denies fever, chills, shivering, or other signs of infection  HTN Elevated BP during the visit today while hospitalized. She was discharged with her HTN home regimen including Amlodipine , Coreg , Avapro , Aldactone , and Hydralazine .   PERTINENT  PMH / PSH:  SBO SVT HTN   OBJECTIVE:   BP (!) 156/81   Pulse 88   Ht 5' 6 (1.676 m)   Wt 148 lb 12.8 oz (67.5 kg)   LMP 09/23/2015   SpO2 94%   BMI 24.02 kg/m   Physical Exam Constitutional:      Appearance: Normal appearance.  Cardiovascular:     Rate and Rhythm: Normal rate.     Pulses: Normal pulses.  Pulmonary:     Effort: Pulmonary effort is normal.  Abdominal:     Palpations: Abdomen is soft.  Neurological:     Mental Status: She is alert and oriented to person, place, and time.  Psychiatric:        Mood and Affect: Mood normal.        Behavior: Behavior normal.     Skin : midline abdominal incision w/ minimal serous drainage and RLQ incisions CDI  ASSESSMENT/PLAN:   Assessment & Plan SBO (small bowel obstruction) (HCC) Abdominal pain, unspecified abdominal location C/o abdominal pain likely surgical pain.  - Increase Tylenol  from 500 mg Q6HPRN to 1000 mg every 6 hours  - Starting Lidocaine  patch - apply patch at near the incision sites - Starting MiraLax  17 g daily for constipation - Recommending ENSURE or nutrition supplement drink as needed high protein diet to help with wound healing - Pending CBC, BMP - Recommending ENSURE or nutrition supplement drink as needed high protein diet to help with wound healing -  Recommending patient to reach out to General surgery if her pain worsening - Recommending patient to visit ED If develop fever, worsening pain, nauseated or vomiting   Primary hypertension Pt w/ elevated BP and on her home regimen. I suspected her elevated BP probably d/t her pain as well.  - Will continue to monitor - f/u with Cardiology per schedule   F/u in 1 months  Houston Samuels, DO PGY 1 Family Medicine Resident [facility]

## 2024-04-04 NOTE — Assessment & Plan Note (Addendum)
 C/o abdominal pain likely surgical pain.  - Increase Tylenol  from 500 mg Q6HPRN to 1000 mg every 6 hours  - Starting Lidocaine  patch - apply patch at near the incision sites - Starting MiraLax  17 g daily for constipation - Recommending ENSURE or nutrition supplement drink as needed high protein diet to help with wound healing - Pending CBC, BMP - Recommending ENSURE or nutrition supplement drink as needed high protein diet to help with wound healing - Recommending patient to reach out to General surgery if her pain worsening - Recommending patient to visit ED If develop fever, worsening pain, nauseated or vomiting

## 2024-04-05 ENCOUNTER — Ambulatory Visit: Payer: Self-pay

## 2024-04-05 LAB — BASIC METABOLIC PANEL WITH GFR
BUN/Creatinine Ratio: 27 (ref 12–28)
BUN: 30 mg/dL — ABNORMAL HIGH (ref 8–27)
CO2: 21 mmol/L (ref 20–29)
Calcium: 10 mg/dL (ref 8.7–10.3)
Chloride: 102 mmol/L (ref 96–106)
Creatinine, Ser: 1.1 mg/dL — ABNORMAL HIGH (ref 0.57–1.00)
Glucose: 101 mg/dL — ABNORMAL HIGH (ref 70–99)
Potassium: 5 mmol/L (ref 3.5–5.2)
Sodium: 135 mmol/L (ref 134–144)
eGFR: 57 mL/min/1.73 — ABNORMAL LOW (ref >59)

## 2024-04-05 LAB — CBC WITH DIFFERENTIAL/PLATELET
Basophils Absolute: 0.1 x10E3/uL (ref 0.0–0.2)
Basos: 2 %
EOS (ABSOLUTE): 0.2 x10E3/uL (ref 0.0–0.4)
Eos: 3 %
Hematocrit: 38 % (ref 34.0–46.6)
Hemoglobin: 11.4 g/dL (ref 11.1–15.9)
Immature Grans (Abs): 0 x10E3/uL (ref 0.0–0.1)
Immature Granulocytes: 0 %
Lymphocytes Absolute: 1.4 x10E3/uL (ref 0.7–3.1)
Lymphs: 26 %
MCH: 24.4 pg — ABNORMAL LOW (ref 26.6–33.0)
MCHC: 30 g/dL — ABNORMAL LOW (ref 31.5–35.7)
MCV: 81 fL (ref 79–97)
Monocytes Absolute: 0.4 x10E3/uL (ref 0.1–0.9)
Monocytes: 7 %
Neutrophils Absolute: 3.5 x10E3/uL (ref 1.4–7.0)
Neutrophils: 62 %
Platelets: 404 x10E3/uL (ref 150–450)
RBC: 4.68 x10E6/uL (ref 3.77–5.28)
RDW: 13.6 % (ref 11.7–15.4)
WBC: 5.5 x10E3/uL (ref 3.4–10.8)

## 2024-04-14 ENCOUNTER — Encounter (HOSPITAL_BASED_OUTPATIENT_CLINIC_OR_DEPARTMENT_OTHER): Payer: Self-pay

## 2024-04-15 ENCOUNTER — Ambulatory Visit (INDEPENDENT_AMBULATORY_CARE_PROVIDER_SITE_OTHER): Admitting: Family

## 2024-04-15 ENCOUNTER — Other Ambulatory Visit (HOSPITAL_BASED_OUTPATIENT_CLINIC_OR_DEPARTMENT_OTHER): Payer: Self-pay

## 2024-04-15 ENCOUNTER — Other Ambulatory Visit (HOSPITAL_COMMUNITY): Payer: Self-pay

## 2024-04-15 ENCOUNTER — Encounter (HOSPITAL_BASED_OUTPATIENT_CLINIC_OR_DEPARTMENT_OTHER): Payer: Self-pay | Admitting: Family

## 2024-04-15 VITALS — BP 160/80 | HR 65 | Ht 66.0 in | Wt 148.4 lb

## 2024-04-15 DIAGNOSIS — I10 Essential (primary) hypertension: Secondary | ICD-10-CM | POA: Diagnosis not present

## 2024-04-15 DIAGNOSIS — I471 Supraventricular tachycardia, unspecified: Secondary | ICD-10-CM

## 2024-04-15 MED ORDER — OMRON 3 SERIES BP MONITOR DEVI
1.0000 [IU] | Freq: Once | 0 refills | Status: AC
  Filled 2024-04-15: qty 1, 1d supply, fill #0

## 2024-04-15 MED ORDER — HYDRALAZINE HCL 100 MG PO TABS
100.0000 mg | ORAL_TABLET | Freq: Two times a day (BID) | ORAL | 5 refills | Status: DC
  Filled 2024-04-15: qty 60, 30d supply, fill #0
  Filled 2024-05-18: qty 60, 30d supply, fill #1

## 2024-04-15 MED ORDER — CARVEDILOL 3.125 MG PO TABS
3.1250 mg | ORAL_TABLET | Freq: Two times a day (BID) | ORAL | 5 refills | Status: DC
  Filled 2024-04-15 – 2024-04-29 (×2): qty 60, 30d supply, fill #0

## 2024-04-15 NOTE — Patient Instructions (Signed)
 Medication Instructions:   CHANGE Hydralazine  to 100 mg by mouth twice daily--we sent this prescription downstairs at [facility]     Follow-Up: Please follow up in _ONE_ month with Dr. Lonni or Reche Finder, NP   Special Instructions:   We sent a script for a BP cuff downstairs as well to [facility]  Please start checking your blood pressures daily and log this and we will place a call to you on 04/22/24 to check in and obtain your BP readings

## 2024-04-15 NOTE — Progress Notes (Unsigned)
 " Cardiology Office Note   Date:  04/16/2024  ID:  Milisa, Kimbell [birth year], MRN [id number] PCP: Suzen Houston NOVAK, DO  [facility] Providers Cardiologist:  Shelda Bruckner, MD     History of Present Illness EVERLEIGH COLCLASURE is a 63 y.o. female with hx of kidney stones, SVT, HTN, mitral stenosis (echo 02/2024 moderate)  11/19-12/3/25 with SBO secondary to incarcerated incisional hernia s/p exploratory laparotomy/bowel resection 11/20. Episodes of PSVT. Metoprolol  unable to be increased due to bradycardia. Transitioned to low dose Carvedilol  by cardiology inpatient.  Echocardiogram 03/17/2024 normal LVEF 60 to 65%, mild LVH, no RWMA,gr1dd, moderate mitral stenosis, aortic valve sclerosis without stenosis.. PRN Clonidine  stopped due to risk of rebound.   Presents today for follow up independently. She has been having bowel movements since discharge. She saw surgeon earlier this week, incision with some dehisced edges which are dressed. Continues to have issues with pain. Does not have home BP cuff. Brief episode of papitations which was not bothersome.  She works at [employer] that has not returned to work after her hospitalization.  Previous antihypertensives Hydrochlorothiazide    ROS: Please see the history of present illness.    All other systems reviewed and are negative.   Studies Reviewed      Cardiac Studies & Procedures   ______________________________________________________________________________________________     ECHOCARDIOGRAM  ECHOCARDIOGRAM COMPLETE 03/17/2024  Narrative ECHOCARDIOGRAM REPORT    Patient Name:   PAYTYN MESTA Date of Exam: 03/17/2024 Medical Rec #:  [id number]     Height:       73.0 in Accession #:    [id number]    Weight:       147.7 lb Date of Birth:  11-30-1960     BSA:          1.892 m Patient Age:    63 years      BP:           171/69 mmHg Patient Gender: F             HR:           58 bpm. Exam Location:   Inpatient  Procedure: 2D Echo, Cardiac Doppler and Color Doppler (Both Spectral and Color Flow Doppler were utilized during procedure).  Indications:    SVT (supraventricular tachycardia)  History:        Patient has no prior history of Echocardiogram examinations. Risk Factors:Hypertension.  Sonographer:    Philomena Daring Referring Phys: [id number] SUBRINA SUNDIL  IMPRESSIONS   1. Left ventricular ejection fraction, by estimation, is 60 to 65%. The left ventricle has normal function. The left ventricle has no regional wall motion abnormalities. There is mild concentric left ventricular hypertrophy. Left ventricular diastolic parameters are consistent with Grade I diastolic dysfunction (impaired relaxation). 2. Right ventricular systolic function is normal. The right ventricular size is normal. 3. The mitral valve is normal in structure. Trivial mitral valve regurgitation. Moderate mitral stenosis. 4. The aortic valve is tricuspid. There is mild calcification of the aortic valve. Aortic valve regurgitation is not visualized. Aortic valve sclerosis/calcification is present, without any evidence of aortic stenosis.  FINDINGS Left Ventricle: Left ventricular ejection fraction, by estimation, is 60 to 65%. The left ventricle has normal function. The left ventricle has no regional wall motion abnormalities. The left ventricular internal cavity size was normal in size. There is mild concentric left ventricular hypertrophy. Left ventricular diastolic parameters are consistent with Grade I diastolic dysfunction (impaired relaxation).  Right Ventricle:  The right ventricular size is normal. No increase in right ventricular wall thickness. Right ventricular systolic function is normal.  Left Atrium: Left atrial size was normal in size.  Right Atrium: Right atrial size was normal in size.  Pericardium: There is no evidence of pericardial effusion.  Mitral Valve: The mitral valve is normal in  structure. Trivial mitral valve regurgitation. Moderate mitral valve stenosis.  Tricuspid Valve: The tricuspid valve is normal in structure. Tricuspid valve regurgitation is trivial. No evidence of tricuspid stenosis.  Aortic Valve: The aortic valve is tricuspid. There is mild calcification of the aortic valve. Aortic valve regurgitation is not visualized. Aortic valve sclerosis/calcification is present, without any evidence of aortic stenosis. Aortic valve mean gradient measures 5.0 mmHg. Aortic valve peak gradient measures 9.2 mmHg. Aortic valve area, by VTI measures 2.23 cm.  Pulmonic Valve: The pulmonic valve was normal in structure. Pulmonic valve regurgitation is trivial. No evidence of pulmonic stenosis.  Aorta: The aortic root is normal in size and structure.  Venous: The inferior vena cava was not well visualized.  IAS/Shunts: No atrial level shunt detected by color flow Doppler.   LEFT VENTRICLE PLAX 2D LVIDd:         4.70 cm   Diastology LVIDs:         3.30 cm   LV e' medial:    7.18 cm/s LV PW:         1.20 cm   LV E/e' medial:  9.5 LV IVS:        1.20 cm   LV e' lateral:   6.20 cm/s LVOT diam:     2.10 cm   LV E/e' lateral: 11.0 LV SV:         71 LV SV Index:   38 LVOT Area:     3.46 cm   RIGHT VENTRICLE RV Basal diam:  3.10 cm RV Mid diam:    2.30 cm RV S prime:     11.20 cm/s TAPSE (M-mode): 3.2 cm  LEFT ATRIUM             Index        RIGHT ATRIUM           Index LA diam:        2.90 cm 1.53 cm/m   RA Area:     18.20 cm LA Vol (A2C):   46.8 ml 24.74 ml/m  RA Volume:   43.50 ml  23.00 ml/m LA Vol (A4C):   51.0 ml 26.96 ml/m LA Biplane Vol: 51.7 ml 27.33 ml/m AORTIC VALVE AV Area (Vmax):    2.18 cm AV Area (Vmean):   2.20 cm AV Area (VTI):     2.23 cm AV Vmax:           152.00 cm/s AV Vmean:          100.000 cm/s AV VTI:            0.320 m AV Peak Grad:      9.2 mmHg AV Mean Grad:      5.0 mmHg LVOT Vmax:         95.80 cm/s LVOT Vmean:         63.600 cm/s LVOT VTI:          0.206 m LVOT/AV VTI ratio: 0.64  AORTA Ao Root diam: 2.60 cm Ao Asc diam:  2.90 cm  MITRAL VALVE                TRICUSPID VALVE  MV Area (PHT): 2.65 cm     TR Peak grad:   6.2 mmHg MV Decel Time: 286 msec     TR Vmax:        124.00 cm/s MV E velocity: 68.30 cm/s MV A velocity: 118.00 cm/s  SHUNTS MV E/A ratio:  0.58         Systemic VTI:  0.21 m Systemic Diam: 2.10 cm  Toribio Fuel MD Electronically signed by Toribio Fuel MD Signature Date/Time: 03/18/2024/12:10:56 AM    Final          ______________________________________________________________________________________________      Risk Assessment/Calculations   HYPERTENSION CONTROL Vitals:   04/15/24 1100 04/15/24 1125  BP: (!) 180/72 (!) 160/80    The patient's blood pressure is elevated above target today.  In order to address the patient's elevated BP: A current anti-hypertensive medication was adjusted today.          Physical Exam VS:  BP (!) 160/80   Pulse 65   Ht 5' 6 (1.676 m)   Wt 148 lb 6.4 oz (67.3 kg)   LMP 09/23/2015   SpO2 97%   BMI 23.95 kg/m   Vitals:   04/15/24 1100 04/15/24 1125  BP: (!) 180/72 (!) 160/80  Pulse: 65   Height: 5' 6 (1.676 m)   Weight: 148 lb 6.4 oz (67.3 kg)   SpO2: 97%   BMI (Calculated): 23.96           Wt Readings from Last 3 Encounters:  04/15/24 148 lb 6.4 oz (67.3 kg)  04/04/24 148 lb 12.8 oz (67.5 kg)  03/16/24 147 lb 11.3 oz (67 kg)    GEN: Well nourished, well developed in no acute distress NECK: No JVD; No carotid bruits CARDIAC: RRR, no murmurs, rubs, gallops RESPIRATORY:  Clear to auscultation without rales, wheezing or rhonchi  ABDOMEN: Soft, non-tender, non-distended EXTREMITIES:  No edema; No deformity   ASSESSMENT AND PLAN  SVT - brief and rare palpitations since discharge which are not bothersome. Continue coreg  3.125mg  BID. Avoid higher dosing due to propensity for bradycardia. Refills  provided.   HTN - BP not at goal <130/80.  Suspect she has had high blood pressure for some time which has not been managed due to limited medical visits.  She is agreeable to purchase home upper arm BP cuff for home monitoring of BP. Continue amlodipine  10 mg daily, Coreg  3.125 mg twice daily, irbesartan  300 mg daily, spironolactone  25 mg daily. Stop hydralazine  75mg  TID and start Hydralazine  100mg  BID for easier dosing.  Phone call in 1 week to check on BP. Pending BP response consider transition to combination tablet such as amlodipine -valsartan.  Mitral stenosis - Echo 02/2024 normal LVEF, gr1dd, moderate mitral stenosis.  Optimal BP control, as above.  Consider repeat echo in 1 year.  Can be coordinated at follow-up.       Dispo: follow up in 1 month  Signed, Reche GORMAN Finder, NP   "

## 2024-04-22 NOTE — Telephone Encounter (Signed)
 Called pt per Porter and Caitlin Walker,NP to check and record pt's bp readings. Left message on machine for pt to contact the office.

## 2024-04-22 NOTE — Telephone Encounter (Signed)
-----   Message from Nurse Porter HERO sent at 04/15/2024 11:27 AM EST ----- Regarding: CHECK IN AND CALL PATIENT TO SEE HOW BP's ARE ON 12/26 Thank you for checking in on this pts BP readings on 12/26 for Caitlin and I.  Pt aware you will be calling her to check in on her BP readings.  Just send to Caitlin to review.  Thanks friend, Porter

## 2024-04-25 ENCOUNTER — Other Ambulatory Visit (HOSPITAL_COMMUNITY): Payer: Self-pay

## 2024-04-25 NOTE — Telephone Encounter (Signed)
-----   Message from Nurse Porter HERO sent at 04/15/2024 11:27 AM EST ----- Regarding: CHECK IN AND CALL PATIENT TO SEE HOW BP's ARE ON 12/26 Thank you for checking in on this pts BP readings on 12/26 for Caitlin and I.  Pt aware you will be calling her to check in on her BP readings.  Just send to Caitlin to review.  Thanks friend, Porter

## 2024-04-25 NOTE — Telephone Encounter (Signed)
 Lvm for pt to call back with BP readings or send through mychart.

## 2024-04-29 ENCOUNTER — Other Ambulatory Visit (HOSPITAL_COMMUNITY): Payer: Self-pay

## 2024-04-30 ENCOUNTER — Other Ambulatory Visit (HOSPITAL_COMMUNITY): Payer: Self-pay

## 2024-05-18 ENCOUNTER — Other Ambulatory Visit (HOSPITAL_COMMUNITY): Payer: Self-pay

## 2024-05-20 ENCOUNTER — Other Ambulatory Visit (HOSPITAL_BASED_OUTPATIENT_CLINIC_OR_DEPARTMENT_OTHER): Payer: Self-pay

## 2024-05-20 ENCOUNTER — Other Ambulatory Visit (HOSPITAL_COMMUNITY): Payer: Self-pay

## 2024-05-20 ENCOUNTER — Encounter (HOSPITAL_BASED_OUTPATIENT_CLINIC_OR_DEPARTMENT_OTHER): Payer: Self-pay | Admitting: Family

## 2024-05-20 ENCOUNTER — Ambulatory Visit (INDEPENDENT_AMBULATORY_CARE_PROVIDER_SITE_OTHER): Payer: Self-pay | Admitting: Family

## 2024-05-20 VITALS — BP 220/78 | HR 59 | Ht 66.0 in | Wt 158.2 lb

## 2024-05-20 DIAGNOSIS — I050 Rheumatic mitral stenosis: Secondary | ICD-10-CM

## 2024-05-20 DIAGNOSIS — I10 Essential (primary) hypertension: Secondary | ICD-10-CM

## 2024-05-20 DIAGNOSIS — I349 Nonrheumatic mitral valve disorder, unspecified: Secondary | ICD-10-CM

## 2024-05-20 DIAGNOSIS — I471 Supraventricular tachycardia, unspecified: Secondary | ICD-10-CM

## 2024-05-20 MED ORDER — HYDRALAZINE HCL 100 MG PO TABS
100.0000 mg | ORAL_TABLET | Freq: Two times a day (BID) | ORAL | 5 refills | Status: AC
  Filled 2024-05-20: qty 60, 30d supply, fill #0

## 2024-05-20 MED ORDER — AMLODIPINE BESYLATE 10 MG PO TABS
10.0000 mg | ORAL_TABLET | Freq: Every day | ORAL | 5 refills | Status: AC
  Filled 2024-05-20: qty 30, 30d supply, fill #0

## 2024-05-20 MED ORDER — CARVEDILOL 3.125 MG PO TABS
3.1250 mg | ORAL_TABLET | Freq: Two times a day (BID) | ORAL | 5 refills | Status: AC
  Filled 2024-05-20: qty 60, 30d supply, fill #0

## 2024-05-20 MED ORDER — SPIRONOLACTONE 25 MG PO TABS
25.0000 mg | ORAL_TABLET | Freq: Every day | ORAL | 5 refills | Status: AC
  Filled 2024-05-20: qty 30, 30d supply, fill #0

## 2024-05-20 MED ORDER — HYDRALAZINE HCL 50 MG PO TABS
50.0000 mg | ORAL_TABLET | Freq: Once | ORAL | Status: AC
  Administered 2024-05-20: 50 mg via ORAL

## 2024-05-20 MED ORDER — IRBESARTAN 300 MG PO TABS
300.0000 mg | ORAL_TABLET | Freq: Every day | ORAL | 5 refills | Status: AC
  Filled 2024-05-20: qty 30, 30d supply, fill #0

## 2024-05-20 NOTE — Progress Notes (Signed)
 " Cardiology Office Note   Date:  05/20/2024  ID:  Shalece, Staffa 1961-03-28, MRN [id number] PCP: Suzen Houston NOVAK, DO  [facility] Providers Cardiologist:  Shelda Bruckner, MD     History of Present Illness Denise Hudson is a 64 y.o. female with hx of kidney stones, SVT, HTN, mitral stenosis (echo 02/2024 moderate)  11/19-12/3/25 with SBO secondary to incarcerated incisional hernia s/p exploratory laparotomy/bowel resection 11/20. Episodes of PSVT. Metoprolol  unable to be increased due to bradycardia. Transitioned to low dose Carvedilol  by cardiology inpatient.  Echocardiogram 03/17/2024 normal LVEF 60 to 65%, mild LVH, no RWMA,gr1dd, moderate mitral stenosis, aortic valve sclerosis without stenosis.. PRN Clonidine  stopped due to risk of rebound.   She was seen 04/15/2024.  She had brief episodes of palpitations which were not bothersome.  Her BP was not at goal.  Hydralazine  adjusted from 75 mg 3 times daily to 100 mg twice daily for easier dosing.  There was future consideration for combination tablet such as amlodipine -valsartan.  Presents today for follow up. Her blood pressure is markedly elevated. She is only taking hydralazine  and carvedilol . Did not realize to get refills of her Motifene, spironolactone , irbesartan .  She is not yet back at work, she is on LOA due to slow wound healing from her exploratory laparotomy.  No chest pain, exertional dyspnea, palpitations.  She does note headache in setting of elevated blood pressure.  Previous antihypertensives Hydrochlorothiazide    ROS: Please see the history of present illness.    All other systems reviewed and are negative.   Studies Reviewed      Cardiac Studies & Procedures   ______________________________________________________________________________________________     ECHOCARDIOGRAM  ECHOCARDIOGRAM COMPLETE 03/17/2024  Narrative ECHOCARDIOGRAM REPORT    Patient Name:   Denise Hudson Date of  Exam: 03/17/2024 Medical Rec #:  [id number]     Height:       73.0 in Accession #:    [id number]    Weight:       147.7 lb Date of Birth:  1961-04-08     BSA:          1.892 m Patient Age:    63 years      BP:           171/69 mmHg Patient Gender: F             HR:           58 bpm. Exam Location:  Inpatient  Procedure: 2D Echo, Cardiac Doppler and Color Doppler (Both Spectral and Color Flow Doppler were utilized during procedure).  Indications:    SVT (supraventricular tachycardia)  History:        Patient has no prior history of Echocardiogram examinations. Risk Factors:Hypertension.  Sonographer:    Philomena Daring Referring Phys: [id number] SUBRINA SUNDIL  IMPRESSIONS   1. Left ventricular ejection fraction, by estimation, is 60 to 65%. The left ventricle has normal function. The left ventricle has no regional wall motion abnormalities. There is mild concentric left ventricular hypertrophy. Left ventricular diastolic parameters are consistent with Grade I diastolic dysfunction (impaired relaxation). 2. Right ventricular systolic function is normal. The right ventricular size is normal. 3. The mitral valve is normal in structure. Trivial mitral valve regurgitation. Moderate mitral stenosis. 4. The aortic valve is tricuspid. There is mild calcification of the aortic valve. Aortic valve regurgitation is not visualized. Aortic valve sclerosis/calcification is present, without any evidence of aortic stenosis.  FINDINGS Left Ventricle: Left ventricular ejection  fraction, by estimation, is 60 to 65%. The left ventricle has normal function. The left ventricle has no regional wall motion abnormalities. The left ventricular internal cavity size was normal in size. There is mild concentric left ventricular hypertrophy. Left ventricular diastolic parameters are consistent with Grade I diastolic dysfunction (impaired relaxation).  Right Ventricle: The right ventricular size is normal. No increase in  right ventricular wall thickness. Right ventricular systolic function is normal.  Left Atrium: Left atrial size was normal in size.  Right Atrium: Right atrial size was normal in size.  Pericardium: There is no evidence of pericardial effusion.  Mitral Valve: The mitral valve is normal in structure. Trivial mitral valve regurgitation. Moderate mitral valve stenosis.  Tricuspid Valve: The tricuspid valve is normal in structure. Tricuspid valve regurgitation is trivial. No evidence of tricuspid stenosis.  Aortic Valve: The aortic valve is tricuspid. There is mild calcification of the aortic valve. Aortic valve regurgitation is not visualized. Aortic valve sclerosis/calcification is present, without any evidence of aortic stenosis. Aortic valve mean gradient measures 5.0 mmHg. Aortic valve peak gradient measures 9.2 mmHg. Aortic valve area, by VTI measures 2.23 cm.  Pulmonic Valve: The pulmonic valve was normal in structure. Pulmonic valve regurgitation is trivial. No evidence of pulmonic stenosis.  Aorta: The aortic root is normal in size and structure.  Venous: The inferior vena cava was not well visualized.  IAS/Shunts: No atrial level shunt detected by color flow Doppler.   LEFT VENTRICLE PLAX 2D LVIDd:         4.70 cm   Diastology LVIDs:         3.30 cm   LV e' medial:    7.18 cm/s LV PW:         1.20 cm   LV E/e' medial:  9.5 LV IVS:        1.20 cm   LV e' lateral:   6.20 cm/s LVOT diam:     2.10 cm   LV E/e' lateral: 11.0 LV SV:         71 LV SV Index:   38 LVOT Area:     3.46 cm   RIGHT VENTRICLE RV Basal diam:  3.10 cm RV Mid diam:    2.30 cm RV S prime:     11.20 cm/s TAPSE (M-mode): 3.2 cm  LEFT ATRIUM             Index        RIGHT ATRIUM           Index LA diam:        2.90 cm 1.53 cm/m   RA Area:     18.20 cm LA Vol (A2C):   46.8 ml 24.74 ml/m  RA Volume:   43.50 ml  23.00 ml/m LA Vol (A4C):   51.0 ml 26.96 ml/m LA Biplane Vol: 51.7 ml 27.33  ml/m AORTIC VALVE AV Area (Vmax):    2.18 cm AV Area (Vmean):   2.20 cm AV Area (VTI):     2.23 cm AV Vmax:           152.00 cm/s AV Vmean:          100.000 cm/s AV VTI:            0.320 m AV Peak Grad:      9.2 mmHg AV Mean Grad:      5.0 mmHg LVOT Vmax:         95.80 cm/s LVOT Vmean:  63.600 cm/s LVOT VTI:          0.206 m LVOT/AV VTI ratio: 0.64  AORTA Ao Root diam: 2.60 cm Ao Asc diam:  2.90 cm  MITRAL VALVE                TRICUSPID VALVE MV Area (PHT): 2.65 cm     TR Peak grad:   6.2 mmHg MV Decel Time: 286 msec     TR Vmax:        124.00 cm/s MV E velocity: 68.30 cm/s MV A velocity: 118.00 cm/s  SHUNTS MV E/A ratio:  0.58         Systemic VTI:  0.21 m Systemic Diam: 2.10 cm  Toribio Fuel MD Electronically signed by Toribio Fuel MD Signature Date/Time: 03/18/2024/12:10:56 AM    Final          ______________________________________________________________________________________________        Risk Assessment/Calculations   HYPERTENSION CONTROL Vitals:   05/20/24 1450 05/20/24 1547  BP: (!) 234/80 (!) 220/78    The patient's blood pressure is elevated above target today.  In order to address the patient's elevated BP:           Physical Exam VS:  BP (!) 220/78 (BP Location: Right Arm, Patient Position: Sitting)   Pulse (!) 59   Ht 5' 6 (1.676 m)   Wt 158 lb 3.2 oz (71.8 kg)   LMP 09/23/2015   SpO2 92%   BMI 25.53 kg/m   Vitals:   05/20/24 1450 05/20/24 1547  BP: (!) 234/80 (!) 220/78  Pulse: (!) 59   Height: 5' 6 (1.676 m)   Weight: 158 lb 3.2 oz (71.8 kg)   SpO2: 92%   BMI (Calculated): 25.55            Wt Readings from Last 3 Encounters:  05/20/24 158 lb 3.2 oz (71.8 kg)  04/15/24 148 lb 6.4 oz (67.3 kg)  04/04/24 148 lb 12.8 oz (67.5 kg)    GEN: Well nourished, well developed in no acute distress NECK: No JVD; No carotid bruits CARDIAC: RRR, no murmurs, rubs, gallops RESPIRATORY:  Clear to auscultation  without rales, wheezing or rhonchi  ABDOMEN: Soft, non-tender, non-distended EXTREMITIES:  No edema; No deformity   ASSESSMENT AND PLAN  SVT - No bothersome palpitations. Continue coreg  3.125mg  BID. Avoid higher dosing due to propensity for bradycardia. Refills provided.    HTN - BP not at goal <130/80.  BP has been elevated at home 130-170s. She has been out of amlodipine  10 mg daily, irbesartan  300 mg daily, spironolactone  25 mg daily. She had after hospital stay but was unaware she needed refills.  Continue Hydralazine  100mg  BID (TID dosing difficult), Coreg  3.125mg  BID.  Refill and resume amlodipine  10 mg daily, irbesartan  300 mg daily, spironolactone  25 mg daily. She was able to pick up in the pharmacy in the building today. Unfortunately combo tablet amlodipine -valsartan cost prohibitive on her insurance.  BP initiall 234/80 ? provided Hydralazine  50mg  daily ? repeat BP 220/78. She was advised to take Irbesartan  when she picked it up. Initiate discussion of secondary hypertension workup at follow up    Mitral stenosis - Echo 02/2024 normal LVEF, gr1dd, moderate mitral stenosis.  Optimal BP control, as above.  Consider repeat echo in 2-3 years for monitoring. No heart failure symptoms reported.        Dispo: follow up in 2-3 weeks  Signed, Reche GORMAN Finder, NP   "

## 2024-05-20 NOTE — Patient Instructions (Addendum)
 Medication Instructions:   CONTINUE Hydralazine  100mg  twice daily  CONTINUE Carvedilol  3.125mg  twice daily  RESUME Spironolactone  25mg  daily  RESUME Irbesartan  300mg  daily  RESUME Amlodipine  10mg  daily   (Please take your irbesartan /amlodipine  tonight with your coreg /hydralazine . Take your spironolactone  in the morning with your hydralazine /coreg )  *If you need a refill on your cardiac medications before your next appointment, please call your pharmacy*   Follow-Up: At [facility], you and your health needs are our priority.  As part of our continuing mission to provide you with exceptional heart care, our providers are all part of one team.  This team includes your primary Cardiologist (physician) and Advanced Practice Providers or APPs (Physician Assistants and Nurse Practitioners) who all work together to provide you with the care you need, when you need it.  Your next appointment:   2-3  week(s)  Provider:   Shelda Bruckner, MD, Rosaline Bane, NP, Reche Finder, NP, or Allean Mink, PharmD    We recommend signing up for the patient portal called MyChart.  Sign up information is provided on this After Visit Summary.  MyChart is used to connect with patients for Virtual Visits (Telemedicine).  Patients are able to view lab/test results, encounter notes, upcoming appointments, etc.  Non-urgent messages can be sent to your provider as well.   To learn more about what you can do with MyChart, go to [email or web address].   Other Instructions

## 2024-05-31 ENCOUNTER — Other Ambulatory Visit (HOSPITAL_COMMUNITY): Payer: Self-pay

## 2024-06-06 ENCOUNTER — Ambulatory Visit: Payer: Self-pay | Admitting: Pharmacist Clinician (PhC)/ Clinical Pharmacy Specialist

## 2024-06-06 ENCOUNTER — Ambulatory Visit (HOSPITAL_BASED_OUTPATIENT_CLINIC_OR_DEPARTMENT_OTHER): Payer: Self-pay | Admitting: Pharmacist Clinician (PhC)/ Clinical Pharmacy Specialist
